# Patient Record
Sex: Male | Born: 1955 | Race: White | Hispanic: No | Marital: Married | Smoking: Former smoker
Health system: Southern US, Community
[De-identification: ages and names within clinical notes are randomized; demographics above are authoritative.]

## PROBLEM LIST (undated history)

## (undated) DIAGNOSIS — I2510 Atherosclerotic heart disease of native coronary artery without angina pectoris: Secondary | ICD-10-CM

## (undated) DIAGNOSIS — Z8619 Personal history of other infectious and parasitic diseases: Secondary | ICD-10-CM

## (undated) DIAGNOSIS — E785 Hyperlipidemia, unspecified: Secondary | ICD-10-CM

## (undated) DIAGNOSIS — Z8639 Personal history of other endocrine, nutritional and metabolic disease: Secondary | ICD-10-CM

## (undated) DIAGNOSIS — G510 Bell's palsy: Secondary | ICD-10-CM

## (undated) DIAGNOSIS — H532 Diplopia: Secondary | ICD-10-CM

## (undated) DIAGNOSIS — H269 Unspecified cataract: Secondary | ICD-10-CM

## (undated) DIAGNOSIS — E079 Disorder of thyroid, unspecified: Secondary | ICD-10-CM

## (undated) HISTORY — DX: Hyperlipidemia, unspecified: E78.5

## (undated) HISTORY — DX: Bell's palsy: G51.0

## (undated) HISTORY — PX: CARDIAC CATHETERIZATION: SHX172

## (undated) HISTORY — DX: Personal history of other endocrine, nutritional and metabolic disease: Z86.39

## (undated) HISTORY — PX: TONSILLECTOMY: SUR1361

## (undated) HISTORY — DX: Personal history of other infectious and parasitic diseases: Z86.19

## (undated) HISTORY — DX: Unspecified cataract: H26.9

## (undated) HISTORY — PX: COLONOSCOPY: SHX174

## (undated) HISTORY — DX: Atherosclerotic heart disease of native coronary artery without angina pectoris: I25.10

## (undated) HISTORY — PX: TONSILLECTOMY AND ADENOIDECTOMY: SHX28

---

## 2002-08-26 DIAGNOSIS — I2510 Atherosclerotic heart disease of native coronary artery without angina pectoris: Secondary | ICD-10-CM

## 2002-08-26 HISTORY — DX: Atherosclerotic heart disease of native coronary artery without angina pectoris: I25.10

## 2002-11-08 HISTORY — PX: PERCUTANEOUS CORONARY STENT INTERVENTION (PCI-S): SHX6016

## 2015-03-09 ENCOUNTER — Encounter: Payer: Self-pay | Admitting: Family Medicine

## 2015-03-09 ENCOUNTER — Ambulatory Visit (INDEPENDENT_AMBULATORY_CARE_PROVIDER_SITE_OTHER): Payer: BLUE CROSS/BLUE SHIELD | Admitting: Family Medicine

## 2015-03-09 VITALS — BP 148/78 | HR 78 | Temp 97.8°F | Ht 64.5 in | Wt 175.4 lb

## 2015-03-09 DIAGNOSIS — R030 Elevated blood-pressure reading, without diagnosis of hypertension: Secondary | ICD-10-CM | POA: Insufficient documentation

## 2015-03-09 DIAGNOSIS — I2510 Atherosclerotic heart disease of native coronary artery without angina pectoris: Secondary | ICD-10-CM | POA: Diagnosis not present

## 2015-03-09 DIAGNOSIS — I25119 Atherosclerotic heart disease of native coronary artery with unspecified angina pectoris: Secondary | ICD-10-CM | POA: Insufficient documentation

## 2015-03-09 DIAGNOSIS — E66811 Obesity, class 1: Secondary | ICD-10-CM | POA: Insufficient documentation

## 2015-03-09 DIAGNOSIS — E785 Hyperlipidemia, unspecified: Secondary | ICD-10-CM | POA: Diagnosis not present

## 2015-03-09 DIAGNOSIS — E663 Overweight: Secondary | ICD-10-CM

## 2015-03-09 DIAGNOSIS — E669 Obesity, unspecified: Secondary | ICD-10-CM | POA: Insufficient documentation

## 2015-03-09 NOTE — Patient Instructions (Addendum)
Start aspirin  daily. Let's keep an eye on blood pressure at home and if consistently >140/90, let me know. Work on [employer] and increased water intake. I recommend a mediterranean diet. Return as needed or in 2-3 months for physical.      Mediterranean Diet  Why follow it? Research shows. . Those who follow the Mediterranean diet have a reduced risk of heart disease  . The diet is associated with a reduced incidence of Parkinson's and Alzheimer's diseases . People following the diet may have longer life expectancies and lower rates of chronic diseases  . The Dietary Guidelines for Americans recommends the Mediterranean diet as an eating plan to promote health and prevent disease  What Is the Mediterranean Diet?  . Healthy eating plan based on typical foods and recipes of Mediterranean-style cooking . The diet is primarily a plant based diet; these foods should make up a majority of meals   Starches - Plant based foods should make up a majority of meals - They are an important sources of vitamins, minerals, energy, antioxidants, and fiber - Choose whole grains, foods high in fiber and minimally processed items  - Typical grain sources include wheat, oats, barley, corn, brown rice, bulgar, farro, millet, polenta, couscous  - Various types of beans include chickpeas, lentils, fava beans, black beans, white beans   Fruits  Veggies - Large quantities of antioxidant rich fruits & veggies; 6 or more servings  - Vegetables can be eaten raw or lightly drizzled with oil and cooked  - Vegetables common to the traditional Mediterranean Diet include: artichokes, arugula, beets, broccoli, brussel sprouts, cabbage, carrots, celery, collard greens, cucumbers, eggplant, kale, leeks, lemons, lettuce, mushrooms, okra, onions, peas, peppers, potatoes, pumpkin, radishes, rutabaga, shallots, spinach, sweet potatoes, turnips, zucchini - Fruits common to the Mediterranean Diet include: apples, apricots,  avocados, cherries, clementines, dates, figs, grapefruits, grapes, melons, nectarines, oranges, peaches, pears, pomegranates, strawberries, tangerines  Fats - Replace butter and margarine with healthy oils, such as olive oil, canola oil, and tahini  - Limit nuts to no more than a handful a day  - Nuts include walnuts, almonds, pecans, pistachios, pine nuts  - Limit or avoid candied, honey roasted or heavily salted nuts - Olives are central to the [employer] - can be eaten whole or used in a variety of dishes   Meats Protein - Limiting red meat: no more than a few times a month - When eating red meat: choose lean cuts and keep the portion to the size of deck of cards - Eggs: approx. 0 to 4 times a week  - Fish and lean poultry: at least 2 a week  - Healthy protein sources include, chicken, [place], lean beef, lamb - Increase intake of seafood such as tuna, salmon, trout, mackerel, shrimp, scallops - Avoid or limit high fat processed meats such as sausage and bacon  Dairy - Include moderate amounts of low fat dairy products  - Focus on healthy dairy such as fat free yogurt, skim milk, low or reduced fat cheese - Limit dairy products higher in fat such as whole or 2% milk, cheese, ice cream  Alcohol - Moderate amounts of red wine is ok  - No more than 5 oz daily for women (all ages) and men older than age 92  - No more than 10 oz of wine daily for men younger than 64  Other - Limit sweets and other desserts  - Use herbs and spices instead of salt to  flavor foods  - Herbs and spices common to the traditional Mediterranean Diet include: basil, bay leaves, chives, cloves, cumin, fennel, garlic, lavender, marjoram, mint, oregano, parsley, pepper, rosemary, sage, savory, sumac, tarragon, thyme   It's not just a diet, it's a lifestyle:  . The Mediterranean diet includes lifestyle factors typical of those in the region  . Foods, drinks and meals are best eaten with others and savored . Daily  physical activity is important for overall good health . This could be strenuous exercise like running and aerobics . This could also be more leisurely activities such as walking, housework, yard-work, or taking the stairs . Moderation is the key; a balanced and healthy diet accommodates most foods and drinks . Consider portion sizes and frequency of consumption of certain foods   Meal Ideas & Options:  . Breakfast:  o Whole wheat toast or whole wheat English muffins with peanut butter & hard boiled egg o Steel cut oats topped with apples & cinnamon and skim milk  o Fresh fruit: banana, strawberries, melon, berries, peaches  o Smoothies: strawberries, bananas, greek yogurt, peanut butter o Low fat greek yogurt with blueberries and granola  o Egg white omelet with spinach and mushrooms o Breakfast couscous: whole wheat couscous, apricots, skim milk, cranberries  . Sandwiches:  o Hummus and grilled vegetables (peppers, zucchini, squash) on whole wheat bread   o Grilled chicken on whole wheat pita with lettuce, tomatoes, cucumbers or tzatziki  o Tuna salad on whole wheat bread: tuna salad made with greek yogurt, olives, red peppers, capers, green onions o Garlic rosemary lamb pita: lamb sauted with garlic, rosemary, salt & pepper; add lettuce, cucumber, greek yogurt to pita - flavor with lemon juice and black pepper  . Seafood:  o Mediterranean grilled salmon, seasoned with garlic, basil, parsley, lemon juice and black pepper o Shrimp, lemon, and spinach whole-grain pasta salad made with low fat greek yogurt  o Seared scallops with lemon orzo  o Seared tuna steaks seasoned salt, pepper, coriander topped with tomato mixture of olives, tomatoes, olive oil, minced garlic, parsley, green onions and cappers  . Meats:  o Herbed greek chicken salad with kalamata olives, cucumber, feta  o Red bell peppers stuffed with spinach, bulgur, lean ground beef (or lentils) & topped with feta   o Kebabs:  skewers of chicken, tomatoes, onions, zucchini, squash  o [place]urkey burgers: made with red onions, mint, dill, lemon juice, feta cheese topped with roasted red peppers . Vegetarian o Cucumber salad: cucumbers, artichoke hearts, celery, red onion, feta cheese, tossed in olive oil & lemon juice  o Hummus and whole grain pita points with a greek salad (lettuce, tomato, feta, olives, cucumbers, red onion) o Lentil soup with celery, carrots made with vegetable broth, garlic, salt and pepper  o Tabouli salad: parsley, bulgur, mint, scallions, cucumbers, tomato, radishes, lemon juice, olive oil, salt and pepper.

## 2015-03-09 NOTE — Assessment & Plan Note (Signed)
Check FLP next fasting labs. 

## 2015-03-09 NOTE — Assessment & Plan Note (Signed)
S/p stent placed 2004. Requested records today. Advised he start aspirin 81mg  daily. Await records.

## 2015-03-09 NOTE — Progress Notes (Signed)
Pre visit review using our clinic review tool, if applicable. No additional management support is needed unless otherwise documented below in the visit note. 

## 2015-03-09 NOTE — Assessment & Plan Note (Signed)
Discussed increased activity for goal weight loss.

## 2015-03-09 NOTE — Assessment & Plan Note (Signed)
States runs mildly elevated at home but pt declines medication for now. He will monitor blood pressures and let me know if consistently >140/90

## 2015-03-09 NOTE — Progress Notes (Signed)
BP 148/78 mmHg  Pulse 78  Temp(Src) 97.8 F (36.6 C) (Oral)  Ht 5' 4.5" (1.638 m)  Wt 175 lb 6.4 oz (79.561 kg)  BMI 29.65 kg/m2   CC: new pt to establish  Subjective:    Patient ID: Roger [name], male    DOB: 1956-05-10, 59 y.o.   MRN: [id number]  HPI: Roger [name] is a 59 y.o. male presenting on 03/09/2015 for No chief complaint on file.   Recently moved from [place] prior saw Dr Vassie Loll Ward Givens in [place]. Cardiologist was Dr Darrick Penna in [facility], [place]. Works for [employer].  CAD - s/p stent placement 2004 [place]. Actually states cardiologist took him off cardiac meds because he was doing so well. States normal treadmill stress test last checked.   Elevated blood pressure at home.  Occasional "gallbladder attacks". Mainly notices when eating pork. States this has been evaluated and normal in past.  Preventative: No recent CPE Colonoscopy - normal unsure date ([place])  Lives with wife, daughter with granddaughter Edu: some college Occupation: Therapist, art Activity: stays active at work, 20,000 steps daily Diet: poor water, good fruits/vegetables daily  Relevant past medical, surgical, family and social history reviewed and updated as indicated. Interim medical history since our last visit reviewed. Allergies and medications reviewed and updated. No current outpatient prescriptions on file prior to visit.   No current facility-administered medications on file prior to visit.    Review of Systems Per HPI unless specifically indicated above     Objective:    BP 148/78 mmHg  Pulse 78  Temp(Src) 97.8 F (36.6 C) (Oral)  Ht 5' 4.5" (1.638 m)  Wt 175 lb 6.4 oz (79.561 kg)  BMI 29.65 kg/m2  Wt Readings from Last 3 Encounters:  03/09/15 175 lb 6.4 oz (79.561 kg)    Physical Exam  Constitutional: He appears well-developed and well-nourished. No distress.  HENT:  Mouth/Throat: Oropharynx is clear and moist. No  oropharyngeal exudate.  Eyes: Conjunctivae and EOM are normal. Pupils are equal, round, and reactive to light. No scleral icterus.  Neck: Normal range of motion. Neck supple. No thyromegaly present.  Cardiovascular: Normal rate and intact distal pulses.  An irregular rhythm present. Exam reveals gallop (rare).   No murmur heard. Pulmonary/Chest: Effort normal and breath sounds normal. No respiratory distress. He has no wheezes. He has no rales.  Musculoskeletal: He exhibits no edema.  Lymphadenopathy:    He has no cervical adenopathy.  Psychiatric: He has a normal mood and affect.  Nursing note and vitals reviewed.  No results found for this or any previous visit.    Assessment & Plan:  Records requested today.  Problem List Items Addressed This Visit    CAD (coronary artery disease) - Primary    S/p stent placed 2004. Requested records today. Advised he start aspirin  daily. Await records.      Relevant Medications   aspirin (ASPIRIN EC) 81 MG EC tablet   Elevated blood pressure reading without diagnosis of hypertension    States runs mildly elevated at home but pt declines medication for now. He will monitor blood pressures and let me know if consistently >140/90      HLD (hyperlipidemia)    Check FLP next fasting labs.      Relevant Medications   aspirin (ASPIRIN EC) 81 MG EC tablet   Overweight    Discussed increased activity for goal weight loss.  Follow up plan: Return in about 3 months (around 06/09/2015), or as needed, for follow up visit.

## 2015-04-09 ENCOUNTER — Encounter (HOSPITAL_COMMUNITY): Payer: Self-pay | Admitting: *Deleted

## 2015-04-09 ENCOUNTER — Emergency Department (HOSPITAL_COMMUNITY)
Admission: EM | Admit: 2015-04-09 | Discharge: 2015-04-09 | Disposition: A | Discharge status: Home / Self Care | Payer: Worker's Compensation | Attending: Emergency Medicine | Admitting: Emergency Medicine

## 2015-04-09 ENCOUNTER — Emergency Department (HOSPITAL_COMMUNITY): Payer: Worker's Compensation

## 2015-04-09 DIAGNOSIS — Y9389 Activity, other specified: Secondary | ICD-10-CM | POA: Diagnosis not present

## 2015-04-09 DIAGNOSIS — M79644 Pain in right finger(s): Secondary | ICD-10-CM

## 2015-04-09 DIAGNOSIS — Y998 Other external cause status: Secondary | ICD-10-CM | POA: Insufficient documentation

## 2015-04-09 DIAGNOSIS — Y9289 Other specified places as the place of occurrence of the external cause: Secondary | ICD-10-CM | POA: Diagnosis not present

## 2015-04-09 DIAGNOSIS — S6991XA Unspecified injury of right wrist, hand and finger(s), initial encounter: Secondary | ICD-10-CM | POA: Diagnosis not present

## 2015-04-09 DIAGNOSIS — X58XXXA Exposure to other specified factors, initial encounter: Secondary | ICD-10-CM | POA: Diagnosis not present

## 2015-04-09 DIAGNOSIS — Z8639 Personal history of other endocrine, nutritional and metabolic disease: Secondary | ICD-10-CM | POA: Diagnosis not present

## 2015-04-09 DIAGNOSIS — Z87891 Personal history of nicotine dependence: Secondary | ICD-10-CM | POA: Diagnosis not present

## 2015-04-09 DIAGNOSIS — Z79899 Other long term (current) drug therapy: Secondary | ICD-10-CM | POA: Diagnosis not present

## 2015-04-09 DIAGNOSIS — Z7982 Long term (current) use of aspirin: Secondary | ICD-10-CM | POA: Insufficient documentation

## 2015-04-09 DIAGNOSIS — I2510 Atherosclerotic heart disease of native coronary artery without angina pectoris: Secondary | ICD-10-CM | POA: Diagnosis not present

## 2015-04-09 DIAGNOSIS — Z8619 Personal history of other infectious and parasitic diseases: Secondary | ICD-10-CM | POA: Diagnosis not present

## 2015-04-09 MED ORDER — IBUPROFEN 200 MG PO TABS
600.0000 mg | ORAL_TABLET | Freq: Once | ORAL | Status: AC
  Administered 2015-04-09: 600 mg via ORAL
  Filled 2015-04-09: qty 3

## 2015-04-09 NOTE — ED Notes (Signed)
Workers comp, pt had crush injury to right thumb this morning. No acute distress noted.

## 2015-04-09 NOTE — ED Notes (Signed)
Pt transported to xray, called phlebotomy to do workers comp drug screen

## 2015-04-09 NOTE — Discharge Instructions (Signed)
Please use tylenol or ibuprofen as needed for pain. Please follow-up with your primary care provider for further evaluation and management.

## 2015-04-09 NOTE — ED Provider Notes (Signed)
CSN: [id number]     Arrival date & time 04/09/15  5284 History   First MD Initiated Contact with Patient 04/09/15 [phone]     Chief Complaint  Patient presents with  . Finger Injury    HPI   59 year old male presents today with right thumb pain. Patient reports he was working when a heavy box landed on his right finger. Patient reports immediate pain to the lateral dorsal aspect of the right thumb. Patient reports full active range of motion of the thumb and fingers, no open wounds to the affected extremity. Patient notes there is a "bump" on the thumb, this is what concerned him the most. Review of prior injuries to the thumb. Patient has not taken any medication for the pain.    Past Medical History  Diagnosis Date  . History of chicken pox   . CAD (coronary artery disease) 2004    with stent, unclear details  . HLD (hyperlipidemia)   . History of hypothyroidism as a child  . Bell's palsy x2    recurrent   Past Surgical History  Procedure Laterality Date  . Tonsillectomy and adenoidectomy Bilateral   . Percutaneous coronary stent intervention (pci-s)  11/08/02    [place]  . Colonoscopy      normal per patient   Family History  Problem Relation Age of Onset  . Cancer Mother 47    colon  . Cancer Father 49    prostate  . CAD Father 64    massive MI  . CAD Paternal Uncle   . Alcoholism Paternal Uncle   . Stroke Paternal Uncle   . Diabetes Other     maternal side   Social History  Substance Use Topics  . Smoking status: Former Smoker    Quit date: 08/26/1982  . Smokeless tobacco: Never Used  . Alcohol Use: No    Review of Systems  All other systems reviewed and are negative.   Allergies  Review of patient's allergies indicates no known allergies.  Home Medications   Prior to Admission medications   Medication Sig Start Date End Date Taking? Authorizing Provider  aspirin (ASPIRIN EC) 81 MG EC tablet Take 81 mg by mouth daily. Swallow whole.    Historical Provider,  MD  Multiple Vitamins-Minerals (MULTIVITAMIN ADULT PO) Take 1 tablet by mouth daily.    Historical Provider, MD   BP 134/86 mmHg  Pulse 62  Temp(Src) 97.5 F (36.4 C) (Oral)  Resp 16  SpO2 98%   Physical Exam  Constitutional: He is oriented to person, place, and time. He appears well-developed and well-nourished.  HENT:  Head: Normocephalic and atraumatic.  Eyes: Conjunctivae are normal. Pupils are equal, round, and reactive to light. Right eye exhibits no discharge. Left eye exhibits no discharge. No scleral icterus.  Neck: Normal range of motion. No JVD present. No tracheal deviation present.  Cardiovascular: Normal rate.   Pulmonary/Chest: Effort normal. No stridor.  Musculoskeletal:  Tenderness to the right dorsal lateral aspect of the thumb at the DIP. Firm nodule noted. Patient has minor pain with flexion of the DIP, 5 out of 5 strength of the hand, and isolated joints. Sensation grossly intact, cap refill less than 3 seconds. No obvious signs of trauma  Neurological: He is alert and oriented to person, place, and time. Coordination normal.  Psychiatric: He has a normal mood and affect. His behavior is normal. Judgment and thought content normal.  Nursing note and vitals reviewed.   ED Course  Procedures (  including critical care time) Labs Review Labs Reviewed - No data to display  Imaging Review Dg Finger Thumb Right  04/09/2015   CLINICAL DATA:  59 year old male with crush injury to the right thumb. Pain on the posterior aspect of the right thumb.  EXAM: RIGHT THUMB 2+V  COMPARISON:  No priors.  FINDINGS: Three views of the right thumb demonstrate no acute displaced fracture, subluxation or disc dictation. There is joint space narrowing, subchondral sclerosis, subchondral cyst formation and osteophyte formation, compatible with osteoarthritis at the interphalangeal joint.  IMPRESSION: 1. No acute radiographic abnormality of the right thumb. 2. 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   On: 04/09/2015 08:58   I, Ronie Barnhart Todd, personally reviewed and evaluated these images and lab results as part of my medical decision-making.   EKG Interpretation None      MDM   Final diagnoses:  Thumb pain, right    Labs:  Imaging: DG finger   Consults:  Therapeutics:  Discharge Meds:   Assessment/Plan: Patient presents with thumb pain today. No findings on exam or diagnostic imaging that would necessitate further evaluation or management here in the ED setting. Patient is given instructions for symptomatic care, follow up with primary care provider for reevaluation.        Eyvonne Mechanic, PA-C 04/09/15 1610  Margarita Grizzle, MD 04/11/15 [phone]

## 2015-08-05 ENCOUNTER — Emergency Department: Payer: BLUE CROSS/BLUE SHIELD

## 2015-08-05 ENCOUNTER — Emergency Department
Admission: EM | Admit: 2015-08-05 | Discharge: 2015-08-05 | Disposition: A | Discharge status: Home / Self Care | Payer: BLUE CROSS/BLUE SHIELD | Attending: Emergency Medicine | Admitting: Emergency Medicine

## 2015-08-05 ENCOUNTER — Encounter: Payer: Self-pay | Admitting: Emergency Medicine

## 2015-08-05 DIAGNOSIS — J209 Acute bronchitis, unspecified: Secondary | ICD-10-CM | POA: Diagnosis not present

## 2015-08-05 DIAGNOSIS — Z79899 Other long term (current) drug therapy: Secondary | ICD-10-CM | POA: Insufficient documentation

## 2015-08-05 DIAGNOSIS — Z7982 Long term (current) use of aspirin: Secondary | ICD-10-CM | POA: Insufficient documentation

## 2015-08-05 DIAGNOSIS — Z87891 Personal history of nicotine dependence: Secondary | ICD-10-CM | POA: Diagnosis not present

## 2015-08-05 DIAGNOSIS — R05 Cough: Secondary | ICD-10-CM | POA: Diagnosis present

## 2015-08-05 MED ORDER — AZITHROMYCIN 250 MG PO TABS: ORAL_TABLET | ORAL | Status: DC

## 2015-08-05 MED ORDER — GUAIFENESIN-CODEINE 100-10 MG/5ML PO SOLN: 10.0000 mL | ORAL | Status: DC | PRN

## 2015-08-05 NOTE — Discharge Instructions (Signed)

## 2015-08-05 NOTE — ED Notes (Signed)
Pt presents with cough x 2 weeks; taking robitussin. Sometimes he coughs up phlegm, other times, not. Pt alert & oriented with NAD noted.

## 2015-08-05 NOTE — ED Notes (Signed)
Patient presents to the ED with cough x 2 weeks.  Patient denies recent fever.  Denies nausea, vomiting, and diarrhea.  Patient reports pain when he coughs.  Patient walking and talking without difficulty.

## 2015-08-05 NOTE — ED Provider Notes (Signed)
[facility] Emergency Department Provider Note  ____________________________________________  Time seen: Approximately 7:04 PM  I have reviewed the triage vital signs and the nursing notes.   HISTORY  Chief Complaint Cough    HPI Roger MalkinJohn [name] is a 59 y.o. male patient presents for evaluation for cough 2 weeks. Patient states no fever chills nausea vomiting. States that he has pain/burning in his chest when he coughs.States quit smoking 30 years ago but can taste and nicotine when he coughs now.   Past Medical History  Diagnosis Date  . History of chicken pox   . CAD (coronary artery disease) 2004    with stent, unclear details  . HLD (hyperlipidemia)   . History of hypothyroidism as a child  . Bell's palsy x2    recurrent    Patient Active Problem List   Diagnosis Date Noted  . Overweight 03/09/2015  . Elevated blood pressure reading without diagnosis of hypertension 03/09/2015  . CAD (coronary artery disease)   . HLD (hyperlipidemia)     Past Surgical History  Procedure Laterality Date  . Tonsillectomy and adenoidectomy Bilateral   . Percutaneous coronary stent intervention (pci-s)  11/08/02    [place]  . Colonoscopy      normal per patient  . Tonsillectomy    . Cardiac surgery      Current Outpatient Rx  Name  Route  Sig  Dispense  Refill  . aspirin (ASPIRIN EC) 81 MG EC tablet   Oral   Take 81 mg by mouth daily. Swallow whole.         Marland Kitchen. azithromycin (ZITHROMAX Z-PAK) 250 MG tablet      Take 2 tablets (500 mg) on  Day 1,  followed by 1 tablet (250 mg) once daily on Days 2 through 5.   6 each   0   . guaiFENesin-codeine 100-10 MG/5ML syrup   Oral   Take 10 mLs by mouth every 4 (four) hours as needed for cough.   180 mL   0   . Multiple Vitamins-Minerals (MULTIVITAMIN ADULT PO)   Oral   Take 1 tablet by mouth daily.           Allergies Review of patient's allergies indicates no known allergies.  Family History   Problem Relation Age of Onset  . Cancer Mother 1954    colon  . Cancer Father 3555    prostate  . CAD Father 7974    massive MI  . CAD Paternal Uncle   . Alcoholism Paternal Uncle   . Stroke Paternal Uncle   . Diabetes Other     maternal side    Social History Social History  Substance Use Topics  . Smoking status: Former Smoker    Quit date: 08/26/1982  . Smokeless tobacco: Never Used  . Alcohol Use: No    Review of Systems Constitutional: No fever/chills Eyes: No visual changes. ENT: No sore throat. Cardiovascular: Denies chest pain. Respiratory: Denies shortness of breath. Positive for cough Gastrointestinal: No abdominal pain.  No nausea, no vomiting.  No diarrhea.  No constipation. Genitourinary: Negative for dysuria. Musculoskeletal: Negative for back pain. Skin: Negative for rash. Neurological: Negative for headaches, focal weakness or numbness.  10-point ROS otherwise negative.  ____________________________________________   PHYSICAL EXAM:  VITAL SIGNS: ED Triage Vitals  Enc Vitals Group     BP 08/05/15 1819 146/91 mmHg     Pulse Rate 08/05/15 1819 85     Resp 08/05/15 1819 20  Temp 08/05/15 1819 98.1 F (36.7 C)     Temp Source 08/05/15 1819 Oral     SpO2 08/05/15 1819 96 %     Weight 08/05/15 1819 172 lb (78.019 kg)     Height 08/05/15 1819  (1.651 m)     Head Cir --      Peak Flow --      Pain Score 08/05/15 1818 3     Pain Loc --      Pain Edu? --      Excl. in GC? --     Constitutional: Alert and oriented. Well appearing and in no acute distress. Eyes: Conjunctivae are normal. PERRL. EOMI. Head: Atraumatic. Nose: No congestion/rhinnorhea. Mouth/Throat: Mucous membranes are moist.  Oropharynx non-erythematous. Neck: No stridor.   Cardiovascular: Normal rate, regular rhythm. Grossly normal heart sounds.  Good peripheral circulation. Respiratory: Normal respiratory effort.  No retractions. Lungs CTAB. Musculoskeletal: No lower  extremity tenderness nor edema.  No joint effusions. Neurologic:  Normal speech and language. No gross focal neurologic deficits are appreciated. No gait instability. Skin:  Skin is warm, dry and intact. No rash noted. Psychiatric: Mood and affect are normal. Speech and behavior are normal.  ____________________________________________   LABS (all labs ordered are listed, but only abnormal results are displayed)  Labs Reviewed - No data to display ____________________________________________  RADIOLOGY  IMPRESSION: Rounded density projecting in the right infrahilar region not well appreciated on the lateral projection and likely vascular in nature. Nonemergent CT can be performed for further evaluation. ____________________________________________   PROCEDURES  Procedure(s) performed: None  Critical Care performed: No  ____________________________________________   INITIAL IMPRESSION / ASSESSMENT AND PLAN / ED COURSE  Pertinent labs & imaging results that were available during my care of the patient were reviewed by me and considered in my medical decision making (see chart for details).  Acute bronchitis. We'll treat with Zithromax and Robitussin-AC. Patient informed of x-ray studies in instructed to follow-up with his PCP for outpatient CT scan. Patient voices no other emergency medical questions at this time. ____________________________________________   FINAL CLINICAL IMPRESSION(S) / ED DIAGNOSES  Final diagnoses:  Acute bronchitis, unspecified organism      Evangeline Dakin, PA-C 08/05/15 1948  Emily Filbert, MD 08/05/15 [phone]

## 2015-08-05 NOTE — ED Notes (Signed)
Reviewed d/c instructions, follow-up care and prescriptions with patient. Pt verbalized understanding.  

## 2016-01-23 ENCOUNTER — Encounter: Payer: Self-pay | Admitting: Family Medicine

## 2016-01-23 ENCOUNTER — Ambulatory Visit (INDEPENDENT_AMBULATORY_CARE_PROVIDER_SITE_OTHER): Payer: BLUE CROSS/BLUE SHIELD | Admitting: Family Medicine

## 2016-01-23 VITALS — BP 128/80 | HR 76 | Temp 97.5°F | Ht 64.5 in | Wt 172.5 lb

## 2016-01-23 DIAGNOSIS — M545 Low back pain, unspecified: Secondary | ICD-10-CM | POA: Insufficient documentation

## 2016-01-23 DIAGNOSIS — M25512 Pain in left shoulder: Secondary | ICD-10-CM

## 2016-01-23 DIAGNOSIS — M25511 Pain in right shoulder: Secondary | ICD-10-CM | POA: Insufficient documentation

## 2016-01-23 MED ORDER — CYCLOBENZAPRINE HCL 10 MG PO TABS: 5.0000 mg | ORAL_TABLET | Freq: Two times a day (BID) | ORAL | Status: DC | PRN

## 2016-01-23 NOTE — Patient Instructions (Signed)
I think you have strain of lower left back muscles and possible bursitis of shoulder. Treat with ibuprofen 400mg  twice daily with meals for 1 week and flexeril muscle relaxant for lower back (caution it may make you sleepy). May use heating pad to lower back as well. We will refer you to physical therapy for back. Let us know if not better with this treatment.

## 2016-01-23 NOTE — Progress Notes (Signed)
Pre visit review using our clinic review tool, if applicable. No additional management support is needed unless otherwise documented below in the visit note. 

## 2016-01-23 NOTE — Progress Notes (Signed)
BP 128/80 mmHg  Pulse 76  Temp(Src) 97.5 F (36.4 C) (Oral)  Ht 5' 4.5" (1.638 m)  Wt 172 lb 8 oz (78.245 kg)  BMI 29.16 kg/m2   CC: back and shoulder pain  Subjective:    Patient ID: Roger [name], male    DOB: 1956/07/28, 60 y.o.   MRN: [id number]  HPI: Roger [name] is a 60 y.o. male presenting on 01/23/2016 for Back Pain and Shoulder Pain   Several month h/o lower back pain with some lower left back tenderness. Radiation up entire back. Off last 4 days - marked improvement with rest. Describes stabbing pain.   R shoulder pain ongoing for several months as well.   Ibuprofen helps. Activity worsens pain, rest improves pain.   Denies inciting trauma/injury or falls.  Denies fevers/chills, shooting pain down arms or legs, numbness/weakness of legs, or bladder/bowel incontinence.   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   Relevant past medical, surgical, family and social history reviewed and updated as indicated. Interim medical history since our last visit reviewed. Allergies and medications reviewed and updated. Current Outpatient Prescriptions on File Prior to Visit  Medication Sig  . aspirin (ASPIRIN EC) 81 MG EC tablet Take 81 mg by mouth daily. Swallow whole.  . Multiple Vitamins-Minerals (MULTIVITAMIN ADULT PO) Take 1 tablet by mouth daily.   No current facility-administered medications on file prior to visit.    Review of Systems Per HPI unless specifically indicated in ROS section     Objective:    BP 128/80 mmHg  Pulse 76  Temp(Src) 97.5 F (36.4 C) (Oral)  Ht 5' 4.5" (1.638 m)  Wt 172 lb 8 oz (78.245 kg)  BMI 29.16 kg/m2  Wt Readings from Last 3 Encounters:  01/23/16 172 lb 8 oz (78.245 kg)  08/05/15 172 lb (78.019 kg)  03/09/15 175 lb 6.4 oz (79.561 kg)    Physical Exam  Constitutional: He appears well-developed and well-nourished. No distress.  Musculoskeletal: He exhibits no edema.  No pain midline spine No paraspinous mm  tenderness Neg SLR bilaterally. No pain with int/ext rotation at hip. + FABER on right. No pain at SIJ, GTB or sciatic notch bilaterally.  Tender to palpation L lower back.  L shoulder WNL  R Shoulder exam: No deformity of shoulders on inspection. + pain with palpation of R AC joint and bursa. FROM in abduction and forward flexion. No pain or weakness with testing SITS in ext/int rotation. No pain with empty can sign. Neg Speed test. + impingement. No pain with crossover test. + mild pain with rotation of humeral head in GH joint.   Skin: Skin is warm and dry. No rash noted.  Psychiatric: He has a normal mood and affect.  Nursing note and vitals reviewed.  No results found for this or any previous visit.    Assessment & Plan:   Problem List Items Addressed This Visit    Right shoulder pain    Anticipate R shoulder bursitis. rec ice/heat, ibuprofen, flexeril, PT. Update if not improved with treatment      Relevant Orders   Ambulatory referral to Physical Therapy   Left-sided low back pain without sciatica - Primary    Anticipate L lumbar paraspinous or lat dorsi strain. Supportive care discussed including heating pad, ibuprofen  bid with meals x 1 wk, flexeril, rest. rec avoid heavy lifting at work >20lbs, pt declines light duty restriction work note.  Will also refer to PT for  further management.  Update if not improving with treatment, consider imaging.       Relevant Medications   cyclobenzaprine (FLEXERIL) 10 MG tablet   Other Relevant Orders   Ambulatory referral to Physical Therapy       Follow up plan: Return if symptoms worsen or fail to improve.  Eustaquio BoydenJavier Bridgette Wolden, MD

## 2016-01-23 NOTE — Assessment & Plan Note (Signed)
Anticipate R shoulder bursitis. rec ice/heat, ibuprofen, flexeril, PT. Update if not improved with treatment

## 2016-01-23 NOTE — Assessment & Plan Note (Signed)
Anticipate L lumbar paraspinous or lat dorsi strain. Supportive care discussed including heating pad, ibuprofen 400mg  bid with meals x 1 wk, flexeril, rest. rec avoid heavy lifting at work >20lbs, pt declines light duty restriction work note.  Will also refer to PT for further management.  Update if not improving with treatment, consider imaging.

## 2016-06-11 ENCOUNTER — Encounter: Payer: Self-pay | Admitting: Family Medicine

## 2016-06-11 ENCOUNTER — Ambulatory Visit (INDEPENDENT_AMBULATORY_CARE_PROVIDER_SITE_OTHER): Payer: BLUE CROSS/BLUE SHIELD | Admitting: Family Medicine

## 2016-06-11 ENCOUNTER — Ambulatory Visit (INDEPENDENT_AMBULATORY_CARE_PROVIDER_SITE_OTHER)
Admission: RE | Admit: 2016-06-11 | Discharge: 2016-06-11 | Disposition: A | Discharge status: Home / Self Care | Payer: BLUE CROSS/BLUE SHIELD | Source: Ambulatory Visit | Attending: Family Medicine | Admitting: Family Medicine

## 2016-06-11 VITALS — BP 134/90 | HR 76 | Temp 97.6°F | Wt 167.5 lb

## 2016-06-11 DIAGNOSIS — R079 Chest pain, unspecified: Secondary | ICD-10-CM | POA: Insufficient documentation

## 2016-06-11 DIAGNOSIS — R0789 Other chest pain: Secondary | ICD-10-CM | POA: Diagnosis not present

## 2016-06-11 NOTE — Assessment & Plan Note (Signed)
Ribcage strain vs rib fractures. Right rib xrays to r/o flail chest although doubt given stability with breathing. Supportive care discussed as per instructions. Anticipate 4-6 wk recovery course.

## 2016-06-11 NOTE — Progress Notes (Signed)
Pre visit review using our clinic review tool, if applicable. No additional management support is needed unless otherwise documented below in the visit note. 

## 2016-06-11 NOTE — Progress Notes (Signed)
   BP 134/90   Pulse 76   Temp 97.6 F (36.4 C) (Oral)   Wt 167 lb 8 oz (76 kg)   BMI 28.31 kg/m    CC: right chest pain Subjective:    Patient ID: Roger MalkinJohn Moffit, male    DOB: 1956-07-18, 60 y.o.   MRN: [id number]  HPI: Roger MalkinJohn Rubi is a 60 y.o. male presenting on 06/11/2016 for Chest Pain (x 1 week after granddaughter jumped on him)   1 wk ago 62yo granddaughter sat on his right chest while he was laying on the ground. Since then certain positions cause R chest wall tenderness. Treating with OTC tylenol and ibuprofen. Denies wheezing, dyspnea or cough.   Relevant past medical, surgical, family and social history reviewed and updated as indicated. Interim medical history since our last visit reviewed. Allergies and medications reviewed and updated. Current Outpatient Prescriptions on File Prior to Visit  Medication Sig  . aspirin (ASPIRIN EC) 81 MG EC tablet Take 81 mg by mouth daily. Swallow whole.  . Multiple Vitamins-Minerals (MULTIVITAMIN ADULT PO) Take 1 tablet by mouth daily.   No current facility-administered medications on file prior to visit.     Review of Systems Per HPI unless specifically indicated in ROS section     Objective:    BP 134/90   Pulse 76   Temp 97.6 F (36.4 C) (Oral)   Wt 167 lb 8 oz (76 kg)   BMI 28.31 kg/m   Wt Readings from Last 3 Encounters:  06/11/16 167 lb 8 oz (76 kg)  01/23/16 172 lb 8 oz (78.2 kg)  08/05/15 172 lb (78 kg)    Physical Exam  Constitutional: He appears well-developed and well-nourished. No distress.  Cardiovascular: Normal rate, regular rhythm, normal heart sounds and intact distal pulses.   No murmur heard. Pulmonary/Chest: Effort normal and breath sounds normal. No respiratory distress. He has no wheezes. He has no rales. He exhibits tenderness.    R ribcage point discomfort to palpation at several areas  No sternal or costochondral discomfort  Abdominal: Soft. Bowel sounds are normal. He exhibits no  distension. There is no tenderness.  Psychiatric: He has a normal mood and affect.  Nursing note and vitals reviewed.  No results found for this or any previous visit.    Assessment & Plan:   Problem List Items Addressed This Visit    Right-sided chest wall pain - Primary    Ribcage strain vs rib fractures. Right rib xrays to r/o flail chest although doubt given stability with breathing. Supportive care discussed as per instructions. Anticipate 4-6 wk recovery course.      Relevant Orders   DG Ribs Unilateral Right    Other Visit Diagnoses   None.      Follow up plan: Return if symptoms worsen or fail to improve.  Eustaquio BoydenJavier Makya Phillis, MD

## 2016-06-11 NOTE — Patient Instructions (Signed)
Xray of right side of ribs today. Rib fracture vs rib bone bruise.  Treat with tylenol or ibuprofen as needed, heating pad. This may take 4-6 weeks to fully heal.

## 2016-07-16 IMAGING — CR DG FINGER THUMB 2+V*R*
3 series · 3 of 3 positions shown · non-contrast
Comparison: No priors.

CLINICAL DATA: 58-year-old male with crush injury to the right
thumb. Pain on the posterior aspect of the right thumb.

EXAM:
RIGHT THUMB 2+V

[finger ap]
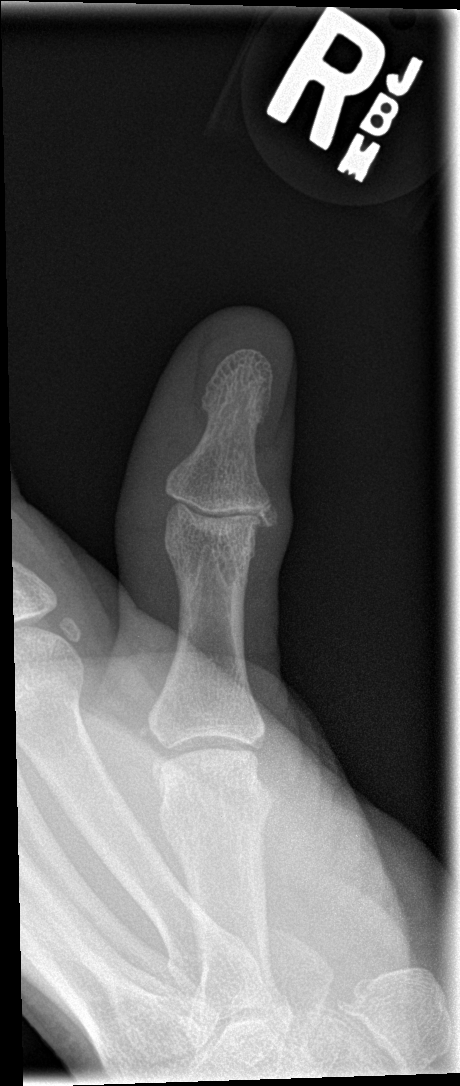

[finger obl]
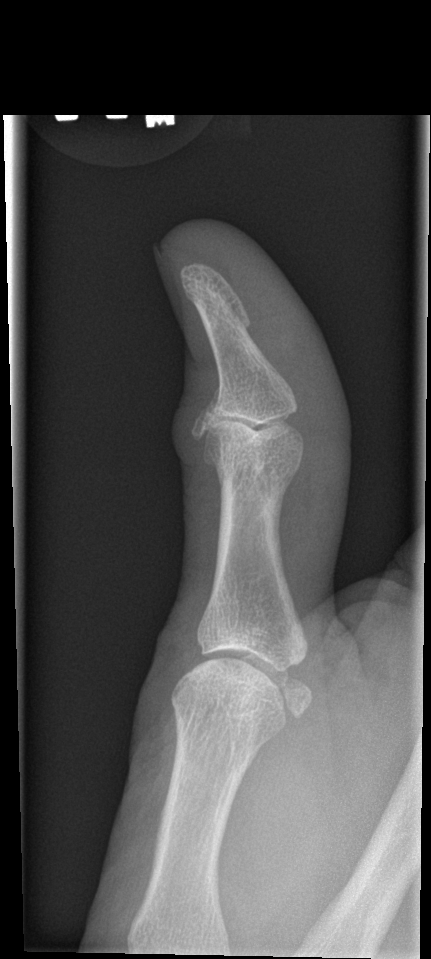

[finger lat]
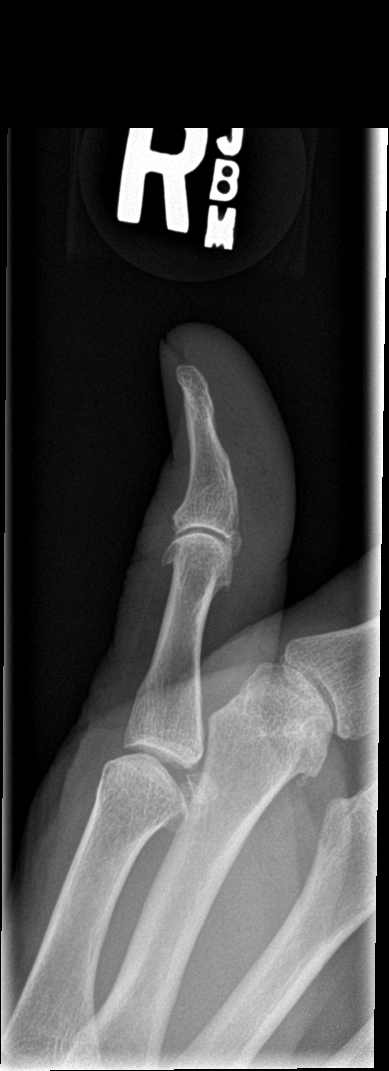

[3 of 3 positions shown; findings below may reference images not displayed]

FINDINGS: Three views of the right thumb demonstrate no acute displaced
fracture, subluxation or disc dictation. There is joint space
narrowing, subchondral sclerosis, subchondral cyst formation and
osteophyte formation, compatible with osteoarthritis at the
interphalangeal joint.
IMPRESSION: 1. No acute radiographic abnormality of the right thumb.
2. Moderate osteoarthritis at the right first interphalangeal joint
appear

## 2016-08-26 ENCOUNTER — Other Ambulatory Visit: Payer: Self-pay | Admitting: Family Medicine

## 2016-08-26 DIAGNOSIS — E785 Hyperlipidemia, unspecified: Secondary | ICD-10-CM

## 2016-08-26 DIAGNOSIS — Z1159 Encounter for screening for other viral diseases: Secondary | ICD-10-CM

## 2016-08-26 DIAGNOSIS — Z125 Encounter for screening for malignant neoplasm of prostate: Secondary | ICD-10-CM

## 2016-08-28 ENCOUNTER — Other Ambulatory Visit: Payer: Self-pay

## 2016-09-02 ENCOUNTER — Ambulatory Visit (INDEPENDENT_AMBULATORY_CARE_PROVIDER_SITE_OTHER): Payer: BLUE CROSS/BLUE SHIELD | Admitting: Family Medicine

## 2016-09-02 ENCOUNTER — Encounter: Payer: Self-pay | Admitting: Family Medicine

## 2016-09-02 VITALS — BP 112/78 | HR 61 | Ht 64.25 in | Wt 166.0 lb

## 2016-09-02 DIAGNOSIS — E785 Hyperlipidemia, unspecified: Secondary | ICD-10-CM

## 2016-09-02 DIAGNOSIS — I2510 Atherosclerotic heart disease of native coronary artery without angina pectoris: Secondary | ICD-10-CM | POA: Diagnosis not present

## 2016-09-02 DIAGNOSIS — E663 Overweight: Secondary | ICD-10-CM | POA: Diagnosis not present

## 2016-09-02 DIAGNOSIS — Z125 Encounter for screening for malignant neoplasm of prostate: Secondary | ICD-10-CM

## 2016-09-02 DIAGNOSIS — Z0000 Encounter for general adult medical examination without abnormal findings: Secondary | ICD-10-CM | POA: Diagnosis not present

## 2016-09-02 DIAGNOSIS — Z1159 Encounter for screening for other viral diseases: Secondary | ICD-10-CM

## 2016-09-02 NOTE — Patient Instructions (Addendum)
Labs today.  EKG today.  Sign release for records of colonoscopy from [place].  Continue regular walking as up to now.  Work on [employer].  Return as needed or in 1 year for next physical.   Health Maintenance, Male A healthy lifestyle and preventative care can promote health and wellness.  Maintain regular health, dental, and eye exams.  Eat a healthy diet. Foods like vegetables, fruits, whole grains, low-fat dairy products, and lean protein foods contain the nutrients you need and are low in calories. Decrease your intake of foods high in solid fats, added sugars, and salt. Get information about a proper diet from your health care provider, if necessary.  Regular physical exercise is one of the most important things you can do for your health. Most adults should get at least 150 minutes of moderate-intensity exercise (any activity that increases your heart rate and causes you to sweat) each week. In addition, most adults need muscle-strengthening exercises on 2 or more days a week.   Maintain a healthy weight. The body mass index (BMI) is a screening tool to identify possible weight problems. It provides an estimate of body fat based on height and weight. Your health care provider can find your BMI and can help you achieve or maintain a healthy weight. For males 20 years and older:  A BMI below 18.5 is considered underweight.  A BMI of 18.5 to 24.9 is normal.  A BMI of 25 to 29.9 is considered overweight.  A BMI of 30 and above is considered obese.  Maintain normal blood lipids and cholesterol by exercising and minimizing your intake of saturated fat. Eat a balanced diet with plenty of fruits and vegetables. Blood tests for lipids and cholesterol should begin at age 40 and be repeated every 5 years. If your lipid or cholesterol levels are high, you are over age 76, or you are at high risk for heart disease, you may need your cholesterol levels checked more frequently.Ongoing  high lipid and cholesterol levels should be treated with medicines if diet and exercise are not working.  If you smoke, find out from your health care provider how to quit. If you do not use tobacco, do not start.  Lung cancer screening is recommended for adults aged 55-80 years who are at high risk for developing lung cancer because of a history of smoking. A yearly low-dose CT scan of the lungs is recommended for people who have at least a 30-pack-year history of smoking and are current smokers or have quit within the past 15 years. A pack year of smoking is smoking an average of 1 pack of cigarettes a day for 1 year (for example, a 30-pack-year history of smoking could mean smoking 1 pack a day for 30 years or 2 packs a day for 15 years). Yearly screening should continue until the smoker has stopped smoking for at least 15 years. Yearly screening should be stopped for people who develop a health problem that would prevent them from having lung cancer treatment.  If you choose to drink alcohol, do not have more than 2 drinks per day. One drink is considered to be 12 oz (360 mL) of beer, 5 oz (150 mL) of wine, or 1.5 oz (45 mL) of liquor.  Avoid the use of street drugs. Do not share needles with anyone. Ask for help if you need support or instructions about stopping the use of drugs.  High blood pressure causes heart disease and increases the risk  of stroke. High blood pressure is more likely to develop in:  People who have blood pressure in the end of the normal range (100-139/85-89 mm Hg).  People who are overweight or obese.  People who are African American.  If you are 7018-10439 years of age, have your blood pressure checked every 3-5 years. If you are 63 years of age or older, have your blood pressure checked every year. You should have your blood pressure measured twice-once when you are at a hospital or clinic, and once when you are not at a hospital or clinic. Record the average of the two  measurements. To check your blood pressure when you are not at a hospital or clinic, you can use:  An automated blood pressure machine at a pharmacy.  A home blood pressure monitor.  If you are 7445-63 years old, ask your health care provider if you should take aspirin to prevent heart disease.  Diabetes screening involves taking a blood sample to check your fasting blood sugar level. This should be done once every 3 years after age 63 if you are at a normal weight and without risk factors for diabetes. Testing should be considered at a younger age or be carried out more frequently if you are overweight and have at least 1 risk factor for diabetes.  Colorectal cancer can be detected and often prevented. Most routine colorectal cancer screening begins at the age of 63 and continues through age 63. However, your health care provider may recommend screening at an earlier age if you have risk factors for colon cancer. On a yearly basis, your health care provider may provide home test kits to check for hidden blood in the stool. A small camera at the end of a tube may be used to directly examine the colon (sigmoidoscopy or colonoscopy) to detect the earliest forms of colorectal cancer. Talk to your health care provider about this at age 63 when routine screening begins. A direct exam of the colon should be repeated every 5-10 years through age 63, unless early forms of precancerous polyps or small growths are found.  People who are at an increased risk for hepatitis B should be screened for this virus. You are considered at high risk for hepatitis B if:  You were born in a country where hepatitis B occurs often. Talk with your health care provider about which countries are considered high risk.  Your parents were born in a high-risk country and you have not received a shot to protect against hepatitis B (hepatitis B vaccine).  You have HIV or AIDS.  You use needles to inject street drugs.  You live  with, or have sex with, someone who has hepatitis B.  You are a man who has sex with other men (MSM).  You get hemodialysis treatment.  You take certain medicines for conditions like cancer, organ transplantation, and autoimmune conditions.  Hepatitis C blood testing is recommended for all people born from [id number] through 1965 and any individual with known risk factors for hepatitis C.  Healthy men should no longer receive prostate-specific antigen (PSA) blood tests as part of routine cancer screening. Talk to your health care provider about prostate cancer screening.  Testicular cancer screening is not recommended for adolescents or adult males who have no symptoms. Screening includes self-exam, a health care provider exam, and other screening tests. Consult with your health care provider about any symptoms you have or any concerns you have about testicular cancer.  Practice safe sex.  Use condoms and avoid high-risk sexual practices to reduce the spread of sexually transmitted infections (STIs).  You should be screened for STIs, including gonorrhea and chlamydia if:  You are sexually active and are younger than 24 years.  You are older than 24 years, and your health care provider tells you that you are at risk for this type of infection.  Your sexual activity has changed since you were last screened, and you are at an increased risk for chlamydia or gonorrhea. Ask your health care provider if you are at risk.  If you are at risk of being infected with HIV, it is recommended that you take a prescription medicine daily to prevent HIV infection. This is called pre-exposure prophylaxis (PrEP). You are considered at risk if:  You are a man who has sex with other men (MSM).  You are a heterosexual man who is sexually active with multiple partners.  You take drugs by injection.  You are sexually active with a partner who has HIV.  Talk with your health care provider about whether you are at  high risk of being infected with HIV. If you choose to begin PrEP, you should first be tested for HIV. You should then be tested every 3 months for as long as you are taking PrEP.  Use sunscreen. Apply sunscreen liberally and repeatedly throughout the day. You should seek shade when your shadow is shorter than you. Protect yourself by wearing long sleeves, pants, a wide-brimmed hat, and sunglasses year round whenever you are outdoors.  Tell your health care provider of new moles or changes in moles, especially if there is a change in shape or color. Also, tell your health care provider if a mole is larger than the size of a pencil eraser.  A one-time screening for abdominal aortic aneurysm (AAA) and surgical repair of large AAAs by ultrasound is recommended for men aged 65-75 years who are current or former smokers.  Stay current with your vaccines (immunizations). This information is not intended to replace advice given to you by your health care provider. Make sure you discuss any questions you have with your health care provider. Document Released: 02/08/2008 Document Revised: 09/02/2014 Document Reviewed: 05/16/2015 Elsevier Interactive Patient Education  2017 [employer]

## 2016-09-02 NOTE — Assessment & Plan Note (Addendum)
Update EKG today. Pt does not know details of stent, I never received records. Continue aspirin 81mg  daily.

## 2016-09-02 NOTE — Assessment & Plan Note (Signed)
Preventative protocols reviewed and updated unless pt declined. Discussed healthy diet and lifestyle.  

## 2016-09-02 NOTE — Progress Notes (Addendum)
BP 112/78   Pulse 61   Ht 5' 4.25" (1.632 m)   Wt 166 lb (75.3 kg)   SpO2 98%   BMI 28.27 kg/m    CC: CPE Subjective:    Patient ID: Roger [name], male    DOB: Aug 29, 1955, 61 y.o.   MRN: [id number]  HPI: Roger [name] is a 61 y.o. male presenting on 09/02/2016 for Annual Exam   Fasting today for labs  Preventative: COLONOSCOPY normal per patient in [place] but unsure where - requested today Prostate cancer screen - discussed, agrees to screen.  Lung cancer screening - not eligible Flu - declines Thinks UTD tetanus Seat belt use discussed Sunscreen use discussed. No changing moles on skin.  Ex smoker - quit remotely alcohol - none  Lives with wife, daughter with granddaughter Edu: some college Occupation: Therapist, art Activity: stays active at work, 20,000 steps daily  Diet: poor water, good fruits/vegetables daily   Relevant past medical, surgical, family and social history reviewed and updated as indicated. Interim medical history since our last visit reviewed. Allergies and medications reviewed and updated. Current Outpatient Prescriptions on File Prior to Visit  Medication Sig  . aspirin (ASPIRIN EC) 81 MG EC tablet Take 81 mg by mouth daily. Swallow whole.  . Multiple Vitamins-Minerals (MULTIVITAMIN ADULT PO) Take 1 tablet by mouth daily.   No current facility-administered medications on file prior to visit.     Review of Systems  Constitutional: Negative for activity change, appetite change, chills, fatigue, fever and unexpected weight change.  HENT: Negative for hearing loss.   Eyes: Negative for visual disturbance.  Respiratory: Negative for cough, chest tightness, shortness of breath and wheezing.   Cardiovascular: Negative for chest pain, palpitations and leg swelling.  Gastrointestinal: Negative for abdominal distention, abdominal pain, blood in stool, constipation, diarrhea, nausea and vomiting.  Genitourinary: Negative for difficulty  urinating and hematuria.  Musculoskeletal: Negative for arthralgias, myalgias and neck pain.  Skin: Negative for rash.  Neurological: Negative for dizziness, seizures, syncope and headaches.  Hematological: Negative for adenopathy. Does not bruise/bleed easily.  Psychiatric/Behavioral: Negative for dysphoric mood. The patient is not nervous/anxious.    Per HPI unless specifically indicated in ROS section     Objective:    BP 112/78   Pulse 61   Ht 5' 4.25" (1.632 m)   Wt 166 lb (75.3 kg)   SpO2 98%   BMI 28.27 kg/m   Wt Readings from Last 3 Encounters:  09/02/16 166 lb (75.3 kg)  06/11/16 167 lb 8 oz (76 kg)  01/23/16 172 lb 8 oz (78.2 kg)    Physical Exam  Constitutional: He is oriented to person, place, and time. He appears well-developed and well-nourished. No distress.  HENT:  Head: Normocephalic and atraumatic.  Right Ear: Hearing, tympanic membrane, external ear and ear canal normal.  Left Ear: Hearing, tympanic membrane, external ear and ear canal normal.  Nose: Nose normal.  Mouth/Throat: Uvula is midline, oropharynx is clear and moist and mucous membranes are normal. No oropharyngeal exudate, posterior oropharyngeal edema or posterior oropharyngeal erythema.  Eyes: Conjunctivae and EOM are normal. Pupils are equal, round, and reactive to light. No scleral icterus.  Neck: Normal range of motion. Neck supple. Carotid bruit is not present. No thyromegaly present.  Cardiovascular: Normal rate, regular rhythm, normal heart sounds and intact distal pulses.   No murmur heard. Pulses:      Radial pulses are 2+ on the right side, and 2+ on the left side.  Pulmonary/Chest: Effort normal and breath sounds normal. No respiratory distress. He has no wheezes. He has no rales.  Abdominal: Soft. Bowel sounds are normal. He exhibits no distension and no mass. There is no tenderness. There is no rebound and no guarding.  Genitourinary: Rectum normal. Rectal exam shows no external  hemorrhoid, no internal hemorrhoid, no fissure, no mass, no tenderness and anal tone normal. Prostate is enlarged (25gm with mild enlargement of R lobe). Prostate is not tender.  Musculoskeletal: Normal range of motion. He exhibits no edema.  Lymphadenopathy:    He has no cervical adenopathy.  Neurological: He is alert and oriented to person, place, and time.  CN grossly intact, station and gait intact  Skin: Skin is warm and dry. No rash noted.  Psychiatric: He has a normal mood and affect. His behavior is normal. Judgment and thought content normal.  Nursing note and vitals reviewed.  No results found for this or any previous visit.    EKG - sinus bradycardia 55, normal axis, intervals, no acute ST/T changes, good R wave progression. Assessment & Plan:   Problem List Items Addressed This Visit    CAD (coronary artery disease)    Update EKG today. Pt does not know details of stent, I never received records. Continue aspirin 81mg  daily.       Relevant Orders   EKG 12-Lead   Health maintenance examination - Primary    Preventative protocols reviewed and updated unless pt declined. Discussed healthy diet and lifestyle.       HLD (hyperlipidemia)    Update labs today.       Overweight    Discussed healthy diet and lifestyle changes to affect sustainable weight loss.       Other Visit Diagnoses    Need for hepatitis C screening test       Special screening for malignant neoplasm of prostate           Follow up plan: Return in about 1 year (around 09/02/2017) for annual exam, prior fasting for blood work.  Eustaquio BoydenJavier Robt Okuda, MD

## 2016-09-02 NOTE — Assessment & Plan Note (Signed)
Update labs today 

## 2016-09-02 NOTE — Assessment & Plan Note (Signed)
Discussed healthy diet and lifestyle changes to affect sustainable weight loss  

## 2016-09-03 LAB — COMPREHENSIVE METABOLIC PANEL
ALBUMIN: 4.1 g/dL (ref 3.5–5.2)
ALT: 13 U/L (ref 0–53)
AST: 12 U/L (ref 0–37)
Alkaline Phosphatase: 110 U/L (ref 39–117)
BUN: 15 mg/dL (ref 6–23)
CALCIUM: 9.1 mg/dL (ref 8.4–10.5)
CHLORIDE: 106 meq/L (ref 96–112)
CO2: 28 meq/L (ref 19–32)
Creatinine, Ser: 0.81 mg/dL (ref 0.40–1.50)
GFR: 102.57 mL/min (ref >60.00)
Glucose, Bld: 79 mg/dL (ref 70–99)
POTASSIUM: 4.2 meq/L (ref 3.5–5.1)
SODIUM: 141 meq/L (ref 135–145)
Total Bilirubin: 0.7 mg/dL (ref 0.2–1.2)
Total Protein: 7.2 g/dL (ref 6.0–8.3)

## 2016-09-03 LAB — LIPID PANEL
CHOL/HDL RATIO: 4
Cholesterol: 158 mg/dL (ref 0–200)
HDL: 35.4 mg/dL — ABNORMAL LOW (ref >39.00)
LDL CALC: 96 mg/dL (ref 0–99)
NONHDL: 122.46
TRIGLYCERIDES: 130 mg/dL (ref 0.0–149.0)
VLDL: 26 mg/dL (ref 0.0–40.0)

## 2016-09-03 LAB — HEPATITIS C ANTIBODY: HCV Ab: NEGATIVE

## 2016-09-03 LAB — PSA: PSA: 0.41 ng/mL (ref 0.10–4.00)

## 2017-01-14 ENCOUNTER — Ambulatory Visit
Admission: RE | Admit: 2017-01-14 | Discharge: 2017-01-14 | Disposition: A | Discharge status: Home / Self Care | Payer: BLUE CROSS/BLUE SHIELD | Source: Ambulatory Visit | Attending: Family Medicine | Admitting: Family Medicine

## 2017-01-14 ENCOUNTER — Encounter: Payer: Self-pay | Admitting: Family Medicine

## 2017-01-14 ENCOUNTER — Ambulatory Visit (INDEPENDENT_AMBULATORY_CARE_PROVIDER_SITE_OTHER): Payer: BLUE CROSS/BLUE SHIELD | Admitting: Family Medicine

## 2017-01-14 VITALS — BP 130/84 | HR 88 | Ht 64.25 in | Wt 171.5 lb

## 2017-01-14 DIAGNOSIS — M25511 Pain in right shoulder: Principal | ICD-10-CM

## 2017-01-14 DIAGNOSIS — G8929 Other chronic pain: Secondary | ICD-10-CM

## 2017-01-14 DIAGNOSIS — M25512 Pain in left shoulder: Principal | ICD-10-CM

## 2017-01-14 DIAGNOSIS — I2510 Atherosclerotic heart disease of native coronary artery without angina pectoris: Secondary | ICD-10-CM

## 2017-01-14 DIAGNOSIS — R42 Dizziness and giddiness: Secondary | ICD-10-CM | POA: Diagnosis not present

## 2017-01-14 DIAGNOSIS — E785 Hyperlipidemia, unspecified: Secondary | ICD-10-CM | POA: Diagnosis not present

## 2017-01-14 MED ORDER — ATORVASTATIN CALCIUM 40 MG PO TABS: 40.0000 mg | ORAL_TABLET | Freq: Every day | ORAL | 3 refills | Status: DC

## 2017-01-14 NOTE — Progress Notes (Signed)
BP 134/90   Pulse 77   Ht 5' 4.25" (1.632 m)   Wt 171 lb 8 oz (77.8 kg)   SpO2 99%   BMI 29.21 kg/m    CC: diziness Subjective:    Patient ID: Roger MalkinJohn Terris, male    DOB: 08/11/1956, 61 y.o.   MRN: [id number]  HPI: Roger [name] is a 61 y.o. male presenting on 01/14/2017 for Acute Visit (X 6 DAYS BALANCE OFF, NAUSEATED, LIGHT HEAD, DIZZINESS)   [facility]. He perceived he had fallen and hit the ground - but never actually fell. Intermittent lightheadedness and nausea over last few months - this happens every other day. No actual LOC or trauma. Stayed home the next day. He had breakfast and lunch that day. Episode happened at 5:30pm.   Denies fevers/chills, chest pain, tightness, dyspnea, cough, cold symptoms, palpitations, headaches, hearing changes (wears hearing aides).  Denies one sided weakness or numbness, confusion, slurred speech, facial weakness.  No presyncope or vertigo.  Thinks he's staying well hydrated.   He is not taking lipitor. Has CAD history.   Ongoing bilateral shoulder pain - previously thought shoulder bursitis. No improvement with PT course 12/2015.   Relevant past medical, surgical, family and social history reviewed and updated as indicated. Interim medical history since our last visit reviewed. Allergies and medications reviewed and updated. Outpatient Medications Prior to Visit  Medication Sig Dispense Refill  . aspirin (ASPIRIN EC) 81 MG EC tablet Take 81 mg by mouth daily. Swallow whole.    . Multiple Vitamins-Minerals (MULTIVITAMIN ADULT PO) Take 1 tablet by mouth daily.     No facility-administered medications prior to visit.      Per HPI unless specifically indicated in ROS section below Review of Systems     Objective:    BP 134/90   Pulse 77   Ht 5' 4.25" (1.632 m)   Wt 171 lb 8 oz (77.8 kg)   SpO2 99%   BMI 29.21 kg/m   Wt Readings from Last 3 Encounters:  01/14/17 171  lb 8 oz (77.8 kg)  09/02/16 166 lb (75.3 kg)  06/11/16 167 lb 8 oz (76 kg)    Physical Exam  Constitutional: He appears well-developed and well-nourished. No distress.  HENT:  Mouth/Throat: Oropharynx is clear and moist.  Neck: Neck supple. Carotid bruit is not present.  Cardiovascular: Normal rate, regular rhythm, normal heart sounds and intact distal pulses.   No murmur heard. Pulmonary/Chest: Effort normal and breath sounds normal. No respiratory distress. He has no wheezes. He has no rales.  Musculoskeletal: He exhibits no edema.  Skin: Skin is warm and dry. No rash noted.  Psychiatric: He has a normal mood and affect.  Nursing note and vitals reviewed.  Results for orders placed or performed in visit on 09/02/16  Lipid panel  Result Value Ref Range   Cholesterol 158 0 - 200 mg/dL   Triglycerides 409.8130.0 0.0 - 149.0 mg/dL   HDL 11.9135.40 (L) >47.82>39.00 mg/dL   VLDL 95.626.0 0.0 - 21.340.0 mg/dL   LDL Cholesterol 96 0 - 99 mg/dL   Total CHOL/HDL Ratio 4    NonHDL 122.46   Hepatitis C antibody  Result Value Ref Range   HCV Ab NEGATIVE NEGATIVE  Comprehensive metabolic panel  Result Value Ref Range   Sodium 141 135 - 145 mEq/L   Potassium 4.2 3.5 - 5.1 mEq/L   Chloride 106 96 - 112 mEq/L  CO2 28 19 - 32 mEq/L   Glucose, Bld 79 70 - 99 mg/dL   BUN 15 6 - 23 mg/dL   Creatinine, Ser 1.61 0.40 - 1.50 mg/dL   Total Bilirubin 0.7 0.2 - 1.2 mg/dL   Alkaline Phosphatase 110 39 - 117 U/L   AST 12 0 - 37 U/L   ALT 13 0 - 53 U/L   Total Protein 7.2 6.0 - 8.3 g/dL   Albumin 4.1 3.5 - 5.2 g/dL   Calcium 9.1 8.4 - 09.6 mg/dL   GFR 045.40 >98.11 mL/min  PSA  Result Value Ref Range   PSA 0.41 0.10 - 4.00 ng/mL      Assessment & Plan:   Problem List Items Addressed This Visit    CAD (coronary artery disease)   Relevant Medications   atorvastatin (LIPITOR) 40 MG tablet   Dizziness - Primary    Describes lightheaded episodes over last few months, acutely worse last week - sensation of fall  without actual fall. Not consistent with presyncope or vertigo. Doubt hypoglycemia although this is possibility. Check orthostatics today. Discussed hydration status. Reviewed EKG from 08/2016 - sinus brady and otherwise normal.       HLD (hyperlipidemia)    In h/o CAD, discussed LDL goal <70. Will start statin. He has tolerated this well in the past.       Relevant Medications   atorvastatin (LIPITOR) 40 MG tablet   Shoulder pain, bilateral    Ongoing shoulder pain now bilateral - will order xrays to eval arthritic burden.      Relevant Orders   DG Shoulder Left   DG Shoulder Right       Follow up plan: Return if symptoms worsen or fail to improve.  Eustaquio Boyden, MD

## 2017-01-14 NOTE — Assessment & Plan Note (Signed)
Describes lightheaded episodes over last few months, acutely worse last week - sensation of fall without actual fall. Not consistent with presyncope or vertigo. Doubt hypoglycemia although this is possibility. Check orthostatics today. Discussed hydration status. Reviewed EKG from 08/2016 - sinus brady and otherwise normal.

## 2017-01-14 NOTE — Patient Instructions (Addendum)
Start cholesterol medicine.  I'm not sure where this episode came from. Possible dehydration. I recommend staying well hydrated throughout the day. If you feel dizzy or lightheaded, drink water and take a seat. If recurrent episode, let us know.

## 2017-01-14 NOTE — Assessment & Plan Note (Signed)
In h/o CAD, discussed LDL goal <70. Will start statin. He has tolerated this well in the past.

## 2017-01-14 NOTE — Assessment & Plan Note (Signed)
Ongoing shoulder pain now bilateral - will order xrays to eval arthritic burden.

## 2018-02-26 ENCOUNTER — Emergency Department
Admission: EM | Admit: 2018-02-26 | Discharge: 2018-02-26 | Disposition: A | Discharge status: Home / Self Care | Payer: BLUE CROSS/BLUE SHIELD | Attending: Student in an Organized Health Care Education/Training Program | Admitting: Student in an Organized Health Care Education/Training Program

## 2018-02-26 DIAGNOSIS — Y999 Unspecified external cause status: Secondary | ICD-10-CM | POA: Diagnosis not present

## 2018-02-26 DIAGNOSIS — S50861A Insect bite (nonvenomous) of right forearm, initial encounter: Secondary | ICD-10-CM | POA: Diagnosis present

## 2018-02-26 DIAGNOSIS — Z87891 Personal history of nicotine dependence: Secondary | ICD-10-CM | POA: Diagnosis not present

## 2018-02-26 DIAGNOSIS — I259 Chronic ischemic heart disease, unspecified: Secondary | ICD-10-CM | POA: Insufficient documentation

## 2018-02-26 DIAGNOSIS — Y93H2 Activity, gardening and landscaping: Secondary | ICD-10-CM | POA: Diagnosis not present

## 2018-02-26 DIAGNOSIS — Y92017 Garden or yard in single-family (private) house as the place of occurrence of the external cause: Secondary | ICD-10-CM | POA: Insufficient documentation

## 2018-02-26 DIAGNOSIS — W57XXXA Bitten or stung by nonvenomous insect and other nonvenomous arthropods, initial encounter: Secondary | ICD-10-CM | POA: Insufficient documentation

## 2018-02-26 DIAGNOSIS — Z7982 Long term (current) use of aspirin: Secondary | ICD-10-CM | POA: Insufficient documentation

## 2018-02-26 MED ORDER — HYDROCORTISONE 1 % EX LOTN: 1.0000 "application " | TOPICAL_LOTION | Freq: Two times a day (BID) | CUTANEOUS | 0 refills | Status: DC

## 2018-02-26 NOTE — ED Triage Notes (Signed)
Pt reports that he felt something "prick" him while he was working with mulch at around 11am.  Pt states he did not notice the bite area until he woke up approximately 7pm.  Pt states it has not gotten any worse since then.  Pt is A&Ox4, in NAD at this time.

## 2018-02-26 NOTE — ED Provider Notes (Signed)
[facility] Emergency Department Provider Note  ____________________________________________  Time seen: Approximately 11:00 PM  I have reviewed the triage vital signs and the nursing notes.   HISTORY  Chief Complaint Insect Bite    HPI Roger [name] is a 62 y.o. male presents to the emergency department with an insect bite on the volar right forearm with mild surrounding ecchymosis that developed today after patient was spreading mulch.  Patient denies pain, pruritus or fever.  Patient noticed ecchymosis and became concerned.  No alleviating measures have been attempted.   Past Medical History:  Diagnosis Date  . Bell's palsy x2   recurrent  . CAD (coronary artery disease) 2004   with stent, unclear details  . History of chicken pox   . History of hypothyroidism as a child  . HLD (hyperlipidemia)     Patient Active Problem List   Diagnosis Date Noted  . Dizziness 01/14/2017  . Health maintenance examination 09/02/2016  . Shoulder pain, bilateral 01/23/2016  . Left-sided low back pain without sciatica 01/23/2016  . Overweight 03/09/2015  . CAD (coronary artery disease)   . HLD (hyperlipidemia)     Past Surgical History:  Procedure Laterality Date  . COLONOSCOPY     normal per patient  . PERCUTANEOUS CORONARY STENT INTERVENTION (PCI-S)  11/08/02   [place]  . TONSILLECTOMY AND ADENOIDECTOMY Bilateral     Prior to Admission medications   Medication Sig Start Date End Date Taking? Authorizing Provider  aspirin (ASPIRIN EC) 81 MG EC tablet Take 81 mg by mouth daily. Swallow whole.    [provider]  atorvastatin (LIPITOR) 40 MG tablet Take 1 tablet (40 mg total) by mouth daily. 01/14/17   Eustaquio Boyden, MD  hydrocortisone 1 % lotion Apply 1 application topically 2 (two) times daily. 02/26/18   Orvil Feil, PA-C  Multiple Vitamins-Minerals (MULTIVITAMIN ADULT PO) Take 1 tablet by mouth daily.    [provider]     Allergies Patient has no known allergies.  Family History  Problem Relation Age of Onset  . Cancer Mother 54       colon  . Cancer Father 10       prostate  . CAD Father 40       massive MI  . CAD Paternal Uncle   . Alcoholism Paternal Uncle   . Stroke Paternal Uncle   . Diabetes Other        maternal side    Social History Social History   Tobacco Use  . Smoking status: Former Smoker    Last attempt to quit: 08/26/1982    Years since quitting: 35.5  . Smokeless tobacco: Never Used  Substance Use Topics  . Alcohol use: No    Alcohol/week: 0.0 oz  . Drug use: No     Review of Systems  Constitutional: No fever/chills Eyes: No visual changes. No discharge ENT: No upper respiratory complaints. Cardiovascular: no chest pain. Respiratory: no cough. No SOB. Gastrointestinal: No abdominal pain.  No nausea, no vomiting.  No diarrhea.  No constipation. Musculoskeletal: Negative for musculoskeletal pain. Skin: Patient has infect bite.  Neurological: Negative for headaches, focal weakness or numbness.   ____________________________________________   PHYSICAL EXAM:  VITAL SIGNS: ED Triage Vitals [02/26/18 2238]  Enc Vitals Group     BP (!) 182/80     Pulse Rate 67     Resp 18     Temp 97.6 F (36.4 C)     Temp Source Oral  SpO2 97 %     Weight 173 lb (78.5 kg)     Height 5\' 5"  (1.651 m)     Head Circumference      Peak Flow      Pain Score 0     Pain Loc      Pain Edu?      Excl. in GC?      Constitutional: Alert and oriented. Well appearing and in no acute distress. Eyes: Conjunctivae are normal. PERRL. EOMI. Head: Atraumatic. Cardiovascular: Normal rate, regular rhythm. Normal S1 and S2.  Good peripheral circulation. Respiratory: Normal respiratory effort without tachypnea or retractions. Lungs CTAB. Good air entry to the bases with no decreased or absent breath sounds. Musculoskeletal: Full range of motion to all extremities. No gross  deformities appreciated. Neurologic:  Normal speech and language. No gross focal neurologic deficits are appreciated.  Skin: Patient has 0.5 cm region of circumferential erythema with a small puncture mark visualized.  Patient has mild surrounding ecchymosis. Psychiatric: Mood and affect are normal. Speech and behavior are normal. Patient exhibits appropriate insight and judgement.   ____________________________________________   LABS (all labs ordered are listed, but only abnormal results are displayed)  Labs Reviewed - No data to display ____________________________________________  EKG   ____________________________________________  RADIOLOGY   No results found.  ____________________________________________    PROCEDURES  Procedure(s) performed:    Procedures    Medications - No data to display   ____________________________________________   INITIAL IMPRESSION / ASSESSMENT AND PLAN / ED COURSE  Pertinent labs & imaging results that were available during my care of the patient were reviewed by me and considered in my medical decision making (see chart for details).  Review of the [place] CSRS was performed in accordance of the [facility] prior to dispensing any controlled drugs.    Assessment and Plan:  Insect bite Patient presents to the emergency department with an insect bite on the right forearm.  Physical exam was completely reassuring and patient was discharged with topical hydrocortisone.  He was advised to follow-up with primary care as needed.  All patient questions were answered.    ____________________________________________  FINAL CLINICAL IMPRESSION(S) / ED DIAGNOSES  Final diagnoses:  Insect bite of right forearm, initial encounter      NEW MEDICATIONS STARTED DURING THIS VISIT:  ED Discharge Orders        Ordered    hydrocortisone 1 % lotion  2 times daily     02/26/18 2256          This chart was dictated using voice recognition  software/Dragon. Despite best efforts to proofread, errors can occur which can change the meaning. Any change was purely unintentional.    Orvil FeilWoods, Jaclyn M, PA-C 02/26/18 2308    Willy Eddyobinson, Patrick, MD 02/26/18 [phone]

## 2018-02-26 NOTE — ED Notes (Signed)
This RN attempted to reach employer [employer] on [address] in [place] at [phone]. Unable to get any one to answer the phone for over 5 mins. Ineligibility form placed on chart and copy given to pt.

## 2019-05-30 ENCOUNTER — Telehealth: Payer: BLUE CROSS/BLUE SHIELD | Admitting: Family

## 2019-05-30 DIAGNOSIS — Z20822 Contact with and (suspected) exposure to covid-19: Secondary | ICD-10-CM

## 2019-05-30 NOTE — Progress Notes (Signed)
E-Visit for Corona Virus Screening   Your current symptoms could be consistent with the coronavirus.  Many health care providers can now test patients at their office but not all are.  [facility] has multiple testing sites. For information on our COVID testing locations and hours go to [email or web address]  Please quarantine yourself while awaiting your test results. The testing center will re-open tomorrow at [place] We are enrolling you in our [place] for [place] . Daily you will receive a questionnaire within the [facility] website. Our COVID 19 response team willl be monitoriing your responses daily.    COVID-19 is a respiratory illness with symptoms that are similar to the flu. Symptoms are typically mild to moderate, but there have been cases of severe illness and death due to the virus. The following symptoms may appear 2-14 days after exposure: . Fever . Cough . Shortness of breath or difficulty breathing . Chills . Repeated shaking with chills . Muscle pain . Headache . Sore throat . New loss of taste or smell . Fatigue . Congestion or runny nose . Nausea or vomiting . Diarrhea  It is vitally important that if you feel that you have an infection such as this virus or any other virus that you stay home and away from places where you may spread it to others.  You should self-quarantine for 14 days if you have symptoms that could potentially be coronavirus or have been in close contact a with a person diagnosed with COVID-19 within the last 2 weeks. You should avoid contact with people age 56 and older.   You should wear a mask or cloth face covering over your nose and mouth if you must be around other people or animals, including pets (even at home). Try to stay at least 6 feet away from other people. This will protect the people around you.   You may also take acetaminophen (Tylenol) as needed for fever.   Reduce your  risk of any infection by using the same precautions used for avoiding the common cold or flu:  Mar[place] Kitchen Wash your hands often with soap and warm water for at least 20 seconds.  If soap and water are not readily available, use an alcohol-based hand sanitizer with at least 60% alcohol.  . If coughing or sneezing, cover your mouth and nose by coughing or sneezing into the elbow areas of your shirt or coat, into a tissue or into your sleeve (not your hands). . Avoid shaking hands with others and consider head nods or verbal greetings only. . Avoid touching your eyes, nose, or mouth with unwashed hands.  . Avoid close contact with people who are sick. . Avoid places or events with large numbers of people in one location, like concerts or sporting events. . Carefully consider travel plans you have or are making. . If you are planning any travel outside or inside the [place], visit the CDC's Travelers' Health webpage for the latest health notices. . If you have some symptoms but not all symptoms, continue to monitor at home and seek medical attention if your symptoms worsen. . If you are having a medical emergency, call 911.  HOME CARE . Only take medications as instructed by your medical team. . Drink plenty of fluids and get plenty of rest. . A steam or ultrasonic humidifier can help if you have congestion.   GET HELP RIGHT AWAY IF YOU HAVE EMERGENCY WARNING SIGNS** FOR COVID-19. If you or someone  is showing any of these signs seek emergency medical care immediately. Call 911 or proceed to your closest emergency facility if: . You develop worsening high fever. . Trouble breathing . Bluish lips or face . Persistent pain or pressure in the chest . New confusion . Inability to wake or stay awake . You cough up blood. . Your symptoms become more severe  **This list is not all possible symptoms. Contact your medical provider for any symptoms that are sever or concerning to you.   MAKE SURE YOU   Understand  these instructions.  Will watch your condition.  Will get help right away if you are not doing well or get worse.  Your e-visit answers were reviewed by a board certified advanced clinical practitioner to complete your personal care plan.  Depending on the condition, your plan could have included both over the counter or prescription medications.  If there is a problem please reply once you have received a response from your provider.  Your safety is important to [place].  If you have drug allergies check your prescription carefully.    You can use MyChart to ask questions about today's visit, request a non-urgent call back, or ask for a work or school excuse for 24 hours related to this e-Visit. If it has been greater than 24 hours you will need to follow up with your provider, or enter a new e-Visit to address those concerns. You will get an e-mail in the next two days asking about your experience.  I hope that your e-visit has been valuable and will speed your recovery. Thank you for using e-visits.

## 2019-08-08 ENCOUNTER — Encounter: Payer: Self-pay | Admitting: Family Medicine

## 2019-08-09 NOTE — Telephone Encounter (Signed)
Plz call to schedule OV. 30 min if able.

## 2019-08-11 NOTE — Telephone Encounter (Signed)
Lvm for pt to call back.  Needs to schedule a 30 min in office visit for abd issues and low back pain.

## 2019-08-12 NOTE — Telephone Encounter (Signed)
Unable to reach patient for the whole week. They have not seen my mychart message.  Please call for update on symptoms and schedule ASAP evaluation. If worsening symptoms, depending on severity rec UCC or ER eval.

## 2019-08-12 NOTE — Telephone Encounter (Signed)
Lvm for pt to call back.  Needs to schedule a 30 min in office visit for abd issues and low back pain.  

## 2019-08-12 NOTE — Telephone Encounter (Addendum)
Unable to reach pt by phone; left v/m requesting cb. I tried calling Zander Ingham and her # is not in service. Per DPR I tried calling Pamala Hurry Raftery and recording person is not available and try call later.

## 2019-08-12 NOTE — Telephone Encounter (Signed)
Lvm asking pt to call back.  Still need to schedule a 30 min in office visit.

## 2019-08-13 NOTE — Telephone Encounter (Signed)
Scheduled on 08/16/19 at 9:30.

## 2019-08-16 ENCOUNTER — Encounter: Payer: Self-pay | Admitting: Family Medicine

## 2019-08-16 ENCOUNTER — Other Ambulatory Visit: Payer: Self-pay

## 2019-08-16 ENCOUNTER — Ambulatory Visit (INDEPENDENT_AMBULATORY_CARE_PROVIDER_SITE_OTHER): Payer: BC Managed Care – PPO | Admitting: Family Medicine

## 2019-08-16 VITALS — BP 140/82 | HR 88 | Temp 97.9°F | Ht 65.0 in | Wt 187.2 lb

## 2019-08-16 DIAGNOSIS — R110 Nausea: Secondary | ICD-10-CM | POA: Insufficient documentation

## 2019-08-16 DIAGNOSIS — E785 Hyperlipidemia, unspecified: Secondary | ICD-10-CM

## 2019-08-16 LAB — CBC WITH DIFFERENTIAL/PLATELET
Basophils Absolute: 0 10*3/uL (ref 0.0–0.1)
Basophils Relative: 0.6 % (ref 0.0–3.0)
Eosinophils Absolute: 0.3 10*3/uL (ref 0.0–0.7)
Eosinophils Relative: 4 % (ref 0.0–5.0)
HCT: 41.5 % (ref 39.0–52.0)
Hemoglobin: 14.1 g/dL (ref 13.0–17.0)
Lymphocytes Relative: 32.2 % (ref 12.0–46.0)
Lymphs Abs: 2.2 10*3/uL (ref 0.7–4.0)
MCHC: 34 g/dL (ref 30.0–36.0)
MCV: 92.3 fl (ref 78.0–100.0)
Monocytes Absolute: 0.3 10*3/uL (ref 0.1–1.0)
Monocytes Relative: 5.1 % (ref 3.0–12.0)
Neutro Abs: 3.9 10*3/uL (ref 1.4–7.7)
Neutrophils Relative %: 58.1 % (ref 43.0–77.0)
Platelets: 206 10*3/uL (ref 150.0–400.0)
RBC: 4.5 Mil/uL (ref 4.22–5.81)
RDW: 14 % (ref 11.5–15.5)
WBC: 6.8 10*3/uL (ref 4.0–10.5)

## 2019-08-16 LAB — COMPREHENSIVE METABOLIC PANEL
ALT: 26 U/L (ref 0–53)
AST: 23 U/L (ref 0–37)
Albumin: 4.3 g/dL (ref 3.5–5.2)
Alkaline Phosphatase: 96 U/L (ref 39–117)
BUN: 17 mg/dL (ref 6–23)
CO2: 29 mEq/L (ref 19–32)
Calcium: 9.1 mg/dL (ref 8.4–10.5)
Chloride: 103 mEq/L (ref 96–112)
Creatinine, Ser: 0.98 mg/dL (ref 0.40–1.50)
GFR: 76.73 mL/min (ref >60.00)
Glucose, Bld: 154 mg/dL — ABNORMAL HIGH (ref 70–99)
Potassium: 3.9 mEq/L (ref 3.5–5.1)
Sodium: 139 mEq/L (ref 135–145)
Total Bilirubin: 0.5 mg/dL (ref 0.2–1.2)
Total Protein: 7.3 g/dL (ref 6.0–8.3)

## 2019-08-16 LAB — POC URINALSYSI DIPSTICK (AUTOMATED)
Bilirubin, UA: NEGATIVE
Blood, UA: NEGATIVE
Glucose, UA: NEGATIVE
Ketones, UA: NEGATIVE
Leukocytes, UA: NEGATIVE
Nitrite, UA: NEGATIVE
Protein, UA: NEGATIVE
Spec Grav, UA: 1.015 (ref 1.010–1.025)
Urobilinogen, UA: 0.2 E.U./dL
pH, UA: 6 (ref 5.0–8.0)

## 2019-08-16 LAB — LDL CHOLESTEROL, DIRECT: Direct LDL: 106 mg/dL

## 2019-08-16 LAB — LIPASE: Lipase: 17 U/L (ref 11.0–59.0)

## 2019-08-16 MED ORDER — OMEPRAZOLE 40 MG PO CPDR: 40.0000 mg | DELAYED_RELEASE_CAPSULE | Freq: Every day | ORAL | 1 refills | Status: DC

## 2019-08-16 MED ORDER — ATORVASTATIN CALCIUM 20 MG PO TABS: 20.0000 mg | ORAL_TABLET | Freq: Every day | ORAL | 3 refills | Status: DC

## 2019-08-16 NOTE — Progress Notes (Signed)
This visit was conducted in person.  BP 140/82 (BP Location: Left Arm, Patient Position: Sitting, Cuff Size: Normal)   Pulse 88   Temp 97.9 F (36.6 C) (Temporal)   Ht 5\' 5"  (1.651 m)   Wt 187 lb 3 oz (84.9 kg) Comment: Wearing boot on left arm  SpO2 97%   BMI 31.15 kg/m    CC: nausea, back pain Subjective:    Patient ID: , male    DOB: 20-Feb-1956, 63 y.o.   MRN: [id number]  HPI: Devantae Babe is a 63 y.o. male presenting on 08/16/2019 for Nausea (C/o nausea for about 1 mo.  Vomited mucous on 08/12/19.  ) and Back Pain (C/o low left back pain, off and on.  Started about 1 mo ago. )   Last seen 12/2016.  Did not start lipitor.   1 mo h/o nausea (lower abdomen) associated with 1 episode of vomiting green tinged emesis. Wife has noticed abdominal distension. He denies abdominal pain. Increased burping noted as well. Nausea doesn't seem to be related to food intake, worse in the evenings (works 3rd shift).   No fevers/chills, diarrhea, constipation, bowel changes, blood in stool, chest pain, dyspnea or cough. No early satiety, appetite comes and goes. No dysphagia. Normal stools - 2 soft formed stools/day. No GERD symptoms. No recent diet changes. Stools are not more buoyant or pale.   Caffeine - 1-2 2L pepsi/day.  Alcohol - none NSAIDs - rare  Also with some intermittent L lower back pain.   Broke L foot at work - has boot in place. More sedentary.     Relevant past medical, surgical, family and social history reviewed and updated as indicated. Interim medical history since our last visit reviewed. Allergies and medications reviewed and updated. Outpatient Medications Prior to Visit  Medication Sig Dispense Refill  . aspirin (ASPIRIN EC) 81 MG EC tablet Take 81 mg by mouth daily. Swallow whole.    . hydrocortisone 1 % lotion Apply 1 application topically 2 (two) times daily. 118 mL 0  . Multiple Vitamins-Minerals (MULTIVITAMIN ADULT PO) Take 1 tablet by mouth  daily.    01/2017 atorvastatin (LIPITOR) 40 MG tablet Take 1 tablet (40 mg total) by mouth daily. (Patient not taking: Reported on 08/16/2019) 90 tablet 3   No facility-administered medications prior to visit.     Per HPI unless specifically indicated in ROS section below Review of Systems Objective:    BP 140/82 (BP Location: Left Arm, Patient Position: Sitting, Cuff Size: Normal)   Pulse 88   Temp 97.9 F (36.6 C) (Temporal)   Ht 5\' 5"  (1.651 m)   Wt 187 lb 3 oz (84.9 kg) Comment: Wearing boot on left arm  SpO2 97%   BMI 31.15 kg/m   Wt Readings from Last 3 Encounters:  08/16/19 187 lb 3 oz (84.9 kg)  02/26/18 173 lb (78.5 kg)  01/14/17 171 lb 8 oz (77.8 kg)    Physical Exam Vitals and nursing note reviewed.  Constitutional:      Appearance: Normal appearance. He is obese. He is not ill-appearing.  HENT:     Mouth/Throat:     Mouth: Mucous membranes are moist.     Pharynx: No posterior oropharyngeal erythema.  Eyes:     Extraocular Movements: Extraocular movements intact.     Pupils: Pupils are equal, round, and reactive to light.  Cardiovascular:     Rate and Rhythm: Normal rate and regular rhythm.     Pulses: Normal  pulses.     Heart sounds: Normal heart sounds. No murmur.  Pulmonary:     Effort: Pulmonary effort is normal. No respiratory distress.     Breath sounds: Normal breath sounds. No wheezing, rhonchi or rales.  Abdominal:     General: Abdomen is flat. Bowel sounds are normal. There is distension (mild).     Palpations: Abdomen is soft. There is no mass.     Tenderness: There is abdominal tenderness (mild) in the left upper quadrant and left lower quadrant. There is no right CVA tenderness, left CVA tenderness, guarding or rebound. Negative signs include Murphy's sign.     Hernia: No hernia is present.  Neurological:     Mental Status: He is alert.  Psychiatric:        Mood and Affect: Mood normal.        Behavior: Behavior normal.       Results for orders  placed or performed in visit on 08/16/19  POCT Urinalysis Dipstick (Automated)  Result Value Ref Range   Color, UA yellow    Clarity, UA clear    Glucose, UA Negative Negative   Bilirubin, UA negative    Ketones, UA negative    Spec Grav, UA 1.015 1.010 - 1.025   Blood, UA negative    pH, UA 6.0 5.0 - 8.0   Protein, UA Negative Negative   Urobilinogen, UA 0.2 0.2 or 1.0 E.U./dL   Nitrite, UA negative    Leukocytes, UA Negative Negative    Assessment & Plan:  This visit occurred during the SARS-CoV-2 public health emergency.  Safety protocols were in place, including screening questions prior to the visit, additional usage of staff PPE, and extensive cleaning of exam room while observing appropriate contact time as indicated for disinfecting solutions.   Problem List Items Addressed This Visit    Nausea - Primary    1+ mo h/o nausea associated with emesis x1. Discussed possible causes ?gastritis vs liver, gallbladder, etc. Start omeprazole 40mg  daily x 2 wks then PRN. Check labwork, UA today. Low threshold to image given duration of nausea. Pt agrees with plan. No red flags. Significant pepsi intake - recommend back off sweetened beverage and caffeine to help symptoms.       Relevant Orders   Comprehensive metabolic panel   CBC with Differential   Lipase   POCT Urinalysis Dipstick (Automated) (Completed)   HLD (hyperlipidemia)    Reviewed reasons to start statin in CAD hx, goal LDL <70 - he agrees. Will start lipitor 20mg  daily. Monitor for myalgias. The 10-year ASCVD risk score Denman George(Goff DC Montez HagemanJr., et al., 2013) is: 15.2%   Values used to calculate the score:     Age: [age] years     Sex: Male     Is Non-Hispanic African American: No     Diabetic: No     Tobacco smoker: No     Systolic Blood Pressure: 140 mmHg     Is BP treated: No     HDL Cholesterol: 35.4 mg/dL     Total Cholesterol: 158 mg/dL       Relevant Medications   atorvastatin (LIPITOR) 20 MG tablet   Other Relevant  Orders   LDL Cholesterol, Direct       Meds ordered this encounter  Medications  . omeprazole (PRILOSEC) 40 MG capsule    Sig: Take 1 capsule (40 mg total) by mouth daily.    Dispense:  30 capsule    Refill:  1  .  atorvastatin (LIPITOR) 20 MG tablet    Sig: Take 1 tablet (20 mg total) by mouth daily.    Dispense:  90 tablet    Refill:  3   Orders Placed This Encounter  Procedures  . Comprehensive metabolic panel  . CBC with Differential  . Lipase  . LDL Cholesterol, Direct  . POCT Urinalysis Dipstick (Automated)    Patient Instructions  Urine test today, labwork today.  Back off sweetened beverages.  Start omeprazole 40mg  daily for 2 weeks then as needed (acid reducer medicine).    Follow up plan: Return if symptoms worsen or fail to improve.  Ria Bush, MD

## 2019-08-16 NOTE — Assessment & Plan Note (Addendum)
1+ mo h/o nausea associated with emesis x1. Discussed possible causes ?gastritis vs liver, gallbladder, etc. Start omeprazole 40mg  daily x 2 wks then PRN. Check labwork, UA today. Low threshold to image given duration of nausea. Pt agrees with plan. No red flags. Significant pepsi intake - recommend back off sweetened beverage and caffeine to help symptoms.

## 2019-08-16 NOTE — Assessment & Plan Note (Signed)
Reviewed reasons to start statin in CAD hx, goal LDL <70 - he agrees. Will start lipitor 20mg  daily. Monitor for myalgias. The 10-year ASCVD risk score Mikey Bussing DC Brooke Bonito., et al., 2013) is: 15.2%   Values used to calculate the score:     Age: 64 years     Sex: Male     Is Non-Hispanic African American: No     Diabetic: No     Tobacco smoker: No     Systolic Blood Pressure: 007 mmHg     Is BP treated: No     HDL Cholesterol: 35.4 mg/dL     Total Cholesterol: 158 mg/dL

## 2019-08-16 NOTE — Patient Instructions (Addendum)
Urine test today, labwork today.  Back off sweetened beverages.  Start omeprazole 40mg  daily for 2 weeks then as needed (acid reducer medicine).

## 2019-09-23 ENCOUNTER — Encounter: Payer: Self-pay | Admitting: *Deleted

## 2019-09-27 ENCOUNTER — Other Ambulatory Visit: Payer: Self-pay

## 2019-09-27 ENCOUNTER — Encounter: Payer: Self-pay | Admitting: Neurology

## 2019-09-27 ENCOUNTER — Ambulatory Visit: Payer: BC Managed Care – PPO | Admitting: Neurology

## 2019-09-27 VITALS — BP 160/82 | HR 78 | Temp 97.7°F | Ht 65.0 in | Wt 195.0 lb

## 2019-09-27 DIAGNOSIS — H02401 Unspecified ptosis of right eyelid: Secondary | ICD-10-CM | POA: Diagnosis not present

## 2019-09-27 DIAGNOSIS — I676 Nonpyogenic thrombosis of intracranial venous system: Secondary | ICD-10-CM

## 2019-09-27 DIAGNOSIS — H532 Diplopia: Secondary | ICD-10-CM | POA: Diagnosis not present

## 2019-09-27 DIAGNOSIS — H4921 Sixth [abducent] nerve palsy, right eye: Secondary | ICD-10-CM

## 2019-09-27 MED ORDER — ALPRAZOLAM 0.25 MG PO TABS: ORAL_TABLET | ORAL | 0 refills | Status: DC

## 2019-09-27 NOTE — Progress Notes (Signed)
[facility]    Provider:  Dr Lucia Gaskins Requesting Provider: Marveen Reeks MD  Primary Care Provider:  Eustaquio Boyden, MD  CC:  Double Vision  HPI:  Roger [name] is a 64 y.o. male here as requested by Marveen Reeks MD for 6th nerve palsy right eye. PMHx HLD, thyroid disorder, CAD, bells palsy, sciatica, overweight. 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 He sees double but no pain, no redness, no fullness, no headaches, he has a bright light across the eye just before having these episodes, no history of migraines or aura the light is recently just once unclear if associated. This one was definitely acute one minute driving the next minute double vision. No headaches. Closing the right eye makes it go away/improve. He takes aspirin sometimes, not daily. No other focal neurologic deficits, associated symptoms, inciting events or modifiable factors.  Reviewed notes, labs and imaging from outside physicians, which showed: see above ldl 106  Review of  Systems: Patient complains of symptoms per HPI as well as the following symptoms: third time with double vision. Pertinent negatives and positives per HPI. All others negative.   Social History   Socioeconomic History  . Marital status: Married    Spouse name: Not on file  . Number of children: Not on file  . Years of education: Not on file  . Highest education level: Not on file  Occupational History  . Not on file  Tobacco Use  . Smoking status: Former Smoker    Quit date: 08/26/1982    Years since quitting: 37.1  . Smokeless tobacco: Never Used  Substance and Sexual Activity  . Alcohol use: No    Alcohol/week: 0.0 standard drinks  . Drug use: No  . Sexual activity: Not on file  Other Topics Concern  . Not on file  Social History Narrative   Lives with wife, daughter with granddaughter   Edu: some college   Occupation: Therapist, art   Activity: stays active at work, 20,000 steps daily   Diet: poor water, good fruits/vegetables daily   Social Determinants of Health   Financial Resource Strain:   . Difficulty of Paying Living Expenses: Not on file  Food Insecurity:   . Worried About Programme researcher, broadcasting/film/video in the Last Year: Not on file  . Ran Out of Food in the Last Year: Not on file  Transportation Needs:   . Lack of Transportation (Medical): Not on file  . Lack of Transportation (Non-Medical): Not on file  Physical Activity:   . Days of Exercise per Week: Not  on file  . Minutes of Exercise per Session: Not on file  Stress:   . Feeling of Stress : Not on file  Social Connections:   . Frequency of Communication with Friends and Family: Not on file  . Frequency of Social Gatherings with Friends and Family: Not on file  . Attends Religious Services: Not on file  . Active Member of Clubs or Organizations: Not on file  . Attends Archivist Meetings: Not on file  . Marital Status: Not on file  Intimate Partner Violence:   . Fear of Current or  Ex-Partner: Not on file  . Emotionally Abused: Not on file  . Physically Abused: Not on file  . Sexually Abused: Not on file    Family History  Problem Relation Age of Onset  . Cancer Mother 97       colon  . Cancer Father 26       prostate  . CAD Father 74       massive MI  . CAD Paternal Uncle   . Alcoholism Paternal Uncle   . Stroke Paternal Uncle   . Diabetes Other        maternal side    Past Medical History:  Diagnosis Date  . Bell's palsy x2   recurrent  . CAD (coronary artery disease) 2004   with stent, unclear details  . Cataract    OD>OS per notes from [facility]  . History of chicken pox   . History of hypothyroidism as a child  . HLD (hyperlipidemia)     Patient Active Problem List   Diagnosis Date Noted  . 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 Diplopia 09/27/2019  . Nausea 08/16/2019  . Dizziness 01/14/2017  . Health maintenance examination 09/02/2016  . Shoulder pain, bilateral 01/23/2016  . Left-sided low back pain without sciatica 01/23/2016  . Overweight 03/09/2015  . CAD (coronary artery disease)   . HLD (hyperlipidemia)     Past Surgical History:  Procedure Laterality Date  . COLONOSCOPY     normal per patient  . PERCUTANEOUS CORONARY STENT INTERVENTION (PCI-S)  11/08/02   [place]  . TONSILLECTOMY AND ADENOIDECTOMY Bilateral     Current Outpatient Medications  Medication Sig Dispense Refill  . aspirin (ASPIRIN EC) 81 MG EC tablet Take 81 mg by mouth daily. Swallow whole.    Marland Kitchen atorvastatin (LIPITOR) 20 MG tablet Take 1 tablet (20 mg total) by mouth daily. 90 tablet 3  . Multiple Vitamins-Minerals (MULTIVITAMIN ADULT PO) Take 1 tablet by mouth daily.    Marland Kitchen omeprazole (PRILOSEC) 40 MG capsule Take 1 capsule (40 mg total) by mouth daily. 30 capsule 1  . ALPRAZolam (XANAX) 0.25 MG tablet Take 1-2 tabs (0.25mg -0.50mg ) 30-60 minutes before procedure. May repeat if needed.Do not drive. 4 tablet 0   No current facility-administered  medications for this visit.    Allergies as of 09/27/2019  . (No Known Allergies)    Vitals: BP (!) 160/82 (BP Location: Right Arm, Patient Position: Sitting)   Pulse 78   Temp 97.7 F (36.5 C) Comment: taken at front  Ht 5\' 5"  (1.651 m)   Wt 195 lb (88.5 kg)   BMI 32.45 kg/m  Last Weight:  Wt Readings from Last 1 Encounters:  09/27/19 195 lb (88.5 kg)   Last Height:   Ht Readings from Last 1 Encounters:  09/27/19 5\' 5"  (1.651 m)     Physical exam: Exam: Gen: NAD, conversant, obese  CV: RRR, no MRG. No Carotid Bruits. No peripheral edema, warm, nontender Eyes: Conjunctivae clear without exudates or hemorrhage  Neuro: Detailed Neurologic Exam  Speech:    Speech is normal; fluent and spontaneous with normal comprehension.  Cognition:    The patient is oriented to person, place, and time;     recent and remote memory intact;     language fluent;     normal attention, concentration,     fund of knowledge Cranial Nerves:    The pupils are equal, round, and reactive to light.  Attempted funduscopy could not visualize due to ophthalmoplegia and small pupils.  Right 6th nerve palsy. Visual fields are grossly intact difficult to tell with the right eye due to inversion. Trigeminal sensation is intact and the muscles of mastication are normal. Ptosis right eye, otherwise symmetric. The palate elevates in the midline. Hearing intact to voice. Voice is normal. Shoulder shrug is normal. The tongue has normal motion without fasciculations.   Coordination:    Normal finger to nose and heel to shin.  Gait:    Heel-toe and tandem gait are normal.   Motor Observation:    No asymmetry, no atrophy, and no involuntary movements noted. Tone:    Normal muscle tone.    Posture:    Posture is normal. normal erect    Strength:    Strength is V/V in the upper and lower limbs.      Sensation: intact to LT     Reflex Exam:  DTR's:    Deep tendon reflexes in the  upper and lower extremities are brisk bilaterally except right biceps (deferred left AJ) Toes:    The right toe is downgoing, left deferred due to pain in brace.   Clonus:    Clonus is absent.    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, differential is wide and it may include strokes, pontine lesions, inflammatory conditions,  will see if we can get thin cuts through the pons, also the need to rule out aneurysm given slight asymmetry of his lids however this is less likely in a cranial nerve VI palsy, also need to rule out myasthenia gravis, thyroid associated eye disease, tumors, inflammatory cranial nerve etiologies, cavernous sinus disease, may be ischemic palsy due to diabetes or hypertension or cholesterol but still have to go through the entire work-up(and I recommended he does not miss his aspirin every day, take control of vascular risk factors if this indeed is ischemic palsy).   MRI of the brain, orbits and blood vessels; see above Labs today May need a lumbar puncture pending results  MRI brain w/wo contrast - MS protocol, thin cuts through pons and brain stem if possible MRi orbits w.wo contrast  - check for inflammatory condition or other of the cavernous sinus due to repeated episodes of ophthalmoplegia right eye for example, eye pain, ptosis MRA of the head: look for aneurysm, cranial dysfunction and ptosis right eye LP if necessary based on above    Orders Placed This Encounter  Procedures  . MR BRAIN W WO CONTRAST  . MR ORBITS W WO CONTRAST  . MR ANGIO HEAD WO CONTRAST  . Acetylcholine receptor, binding  . Acetylcholine receptor, blocking  . Acetylcholine receptor, modulating  .  CK  . Hemoglobin A1c  . C-reactive protein  . Sedimentation rate  . TSH    Meds ordered this encounter  Medications  . ALPRAZolam (XANAX) 0.25 MG tablet    Sig: Take 1-2 tabs (0.25mg -0.50mg ) 30-60 minutes before procedure. May repeat if needed.Do not drive.    Dispense:  4 tablet    Refill:  0    Cc: Eustaquio Boyden, MD,  Marveen Reeks MD   Naomie Dean, MD  [facility] [address] [place], [place] [address]  Phone [phone] Fax [phone]

## 2019-09-27 NOTE — Patient Instructions (Signed)
MRI of the brain, orbits and blood vessels Labs today May need a lumbar puncture pending results  Alprazolam tablets What is this medicine? ALPRAZOLAM (al PRAY zoe lam) is a benzodiazepine. It is used to treat anxiety and panic attacks. This medicine may be used for other purposes; ask your health care provider or pharmacist if you have questions. COMMON BRAND NAME(S): Xanax What should I tell my health care provider before I take this medicine? They need to know if you have any of these conditions:  an alcohol or drug abuse problem  bipolar disorder, depression, psychosis or other mental health conditions  glaucoma  kidney or liver disease  lung or breathing disease  myasthenia gravis  Parkinson's disease  porphyria  seizures or a history of seizures  suicidal thoughts  an unusual or allergic reaction to alprazolam, other benzodiazepines, foods, dyes, or preservatives  pregnant or trying to get pregnant  breast-feeding How should I use this medicine? Take this medicine by mouth with a glass of water. Follow the directions on the prescription label. Take your medicine at regular intervals. Do not take it more often than directed. Do not stop taking except on your doctor's advice. A special MedGuide will be given to you by the pharmacist with each prescription and refill. Be sure to read this information carefully each time. Talk to your pediatrician regarding the use of this medicine in children. Special care may be needed. Overdosage: If you think you have taken too much of this medicine contact a poison control center or emergency room at once. NOTE: This medicine is only for you. Do not share this medicine with others. What if I miss a dose? If you miss a dose, take it as soon as you can. If it is almost time for your next dose, take only that dose. Do not take double or extra doses. What may interact with this medicine? Do not take this medicine with any of the  following medications:  certain antiviral medicines for HIV or AIDS like delavirdine, indinavir  certain medicines for fungal infections like ketoconazole and itraconazole  narcotic medicines for cough  sodium oxybate This medicine may also interact with the following medications:  alcohol  antihistamines for allergy, cough and cold  certain antibiotics like clarithromycin, erythromycin, isoniazid, rifampin, rifapentine, rifabutin, and troleandomycin  certain medicines for blood pressure, heart disease, irregular heart beat  certain medicines for depression, like amitriptyline, fluoxetine, sertraline  certain medicines for seizures like carbamazepine, oxcarbazepine, phenobarbital, phenytoin, primidone  cimetidine  cyclosporine  male hormones, like estrogens or progestins and birth control pills, patches, rings, or injections  general anesthetics like halothane, isoflurane, methoxyflurane, propofol  grapefruit juice  local anesthetics like lidocaine, pramoxine, tetracaine  medicines that relax muscles for surgery  narcotic medicines for pain  other antiviral medicines for HIV or AIDS  phenothiazines like chlorpromazine, mesoridazine, prochlorperazine, thioridazine This list may not describe all possible interactions. Give your health care provider a list of all the medicines, herbs, non-prescription drugs, or dietary supplements you use. Also tell them if you smoke, drink alcohol, or use illegal drugs. Some items may interact with your medicine. What should I watch for while using this medicine? Tell your doctor or health care professional if your symptoms do not start to get better or if they get worse. Do not stop taking except on your doctor's advice. You may develop a severe reaction. Your doctor will tell you how much medicine to take. You may get drowsy or dizzy. Do  not drive, use machinery, or do anything that needs mental alertness until you know how this  medicine affects you. To reduce the risk of dizzy and fainting spells, do not stand or sit up quickly, especially if you are an older patient. Alcohol may increase dizziness and drowsiness. Avoid alcoholic drinks. If you are taking another medicine that also causes drowsiness, you may have more side effects. Give your health care provider a list of all medicines you use. Your doctor will tell you how much medicine to take. Do not take more medicine than directed. Call emergency for help if you have problems breathing or unusual sleepiness. What side effects may I notice from receiving this medicine? Side effects that you should report to your doctor or health care professional as soon as possible:  allergic reactions like skin rash, itching or hives, swelling of the face, lips, or tongue  breathing problems  confusion  loss of balance or coordination  signs and symptoms of low blood pressure like dizziness; feeling faint or lightheaded, falls; unusually weak or tired  suicidal thoughts or other mood changes Side effects that usually do not require medical attention (report to your doctor or health care professional if they continue or are bothersome):  dizziness  dry mouth  nausea, vomiting  tiredness This list may not describe all possible side effects. Call your doctor for medical advice about side effects. You may report side effects to FDA at 1-800-FDA-1088. Where should I keep my medicine? Keep out of the reach of children. This medicine can be abused. Keep your medicine in a safe place to protect it from theft. Do not share this medicine with anyone. Selling or giving away this medicine is dangerous and against the law. Store at room temperature between 20 and 25 degrees C (68 and 77 degrees F). This medicine may cause accidental overdose and death if taken by other adults, children, or pets. Mix any unused medicine with a substance like cat litter or coffee grounds. Then throw the  medicine away in a sealed container like a sealed bag or a coffee can with a lid. Do not use the medicine after the expiration date. NOTE: This sheet is a summary. It may not cover all possible information. If you have questions about this medicine, talk to your doctor, pharmacist, or health care provider.  2020 Elsevier/Gold Standard (2015-05-11 13:47:25)  Lumbar Puncture A lumbar puncture, also called a spinal tap, is a procedure that is done to remove a small amount of the fluid that surrounds the brain and spinal cord (cerebrospinal fluid, CSF). The fluid is then examined in the lab. This procedure may be done to:  Help diagnose various problems, such as meningitis, encephalitis, multiple sclerosis, and other infections.  Remove fluid and relieve pressure that occurs with certain types of headaches.  Look for bleeding within the brain and spinal cord areas (central nervous system).  Place medicine into the spinal fluid. Tell a health care provider about:  Any allergies you have.  All medicines you are taking, including vitamins, herbs, eye drops, creams, and over-the-counter medicines like aspirin or NSAIDs.  Any problems you or family members have had with anesthetic medicines.  Any blood disorders you have.  Any surgeries you have had.  Any medical conditions you have.  Whether you are pregnant or may be pregnant. What are the risks? Generally, this is a safe procedure. However, problems may occur, including:  Infection at the insertion site that can spread to the bone  or spinal fluid.  Bleeding.  Spinal headache. This is a severe headache that occurs when there is a leak of spinal fluid.  Leakage of CSF.  Allergic reactions to medicines or dyes.  Damage to other structures or organs. What happens before the procedure? Staying hydrated Follow instructions from your health care provider about hydration, which may include:  Up to 2 hours before the procedure - you  may continue to drink clear liquids, such as water, clear fruit juice, black coffee, and plain tea. Eating and drinking restrictions Follow instructions from your health care provider about eating and drinking. If you will be given a medicine to help you relax (sedative), these instructions may include:  8 hours before the procedure - stop eating heavy meals or foods such as meat, fried foods, or fatty foods.  6 hours before the procedure - stop eating light meals or foods, such as toast or cereal.  6 hours before the procedure - stop drinking milk or drinks that contain milk.  2 hours before the procedure - stop drinking clear liquids. Medicines  Ask your health care provider about: ? Changing or stopping your regular medicines. This is especially important if you are taking diabetes medicines or blood thinners. ? Taking medicines such as aspirin and ibuprofen. These medicines can thin your blood. Do not take these medicines before your procedure if your health care provider instructs you not to.  You may be given antibiotic medicine to help prevent infection. If so, take the antibiotic as told by your health care provider. General instructions  You may have a blood sample taken.  You may be asked to shower with a germ-killing soap.  Ask your health care provider how your surgical site will be marked or identified.  Plan to have someone take you home from the hospital or clinic. This is especially important if you will be given a sedative.  If you will be going home right after the procedure, plan to have someone with you for 24 hours. What happens during the procedure?   You may lie down on your side with your knees bent, or you may sit with your head resting on a pillow on your lap. ? How you are positioned depends on your age and size. ? You will be positioned so that the spaces between the bones of the spine (vertebrae) are as wide as possible. This will make it easier to pass  the needle into the spinal canal.  The skin on your lower back (lumbar region) will be cleaned.  You will be given an injection of medicine to numb your lower back area (local anesthetic).  You may be given pain medicine or a sedative.  A small needle will be inserted into your lower back until it enters the space that contains the cerebrospinal fluid. The needle will not enter the spinal cord.  Fluid will be collected into tubes. It will be sent to a lab for examination.  The needle will be withdrawn, and a bandage (dressing) will be placed over the area. The procedure may vary among health care providers and hospitals. What happens after the procedure?  Your blood pressure, heart rate, breathing rate, and blood oxygen level will be monitored until any medicines you were given have worn off.  You may need to stay lying down for a while.  You will need to drink plenty of fluids and caffeine to help prevent a headache.  Do not drive for 24 hours if you received  a sedative. Summary  A lumbar puncture, also called a spinal tap, is a procedure that is done to remove a small amount of the cerebrospinal fluid, CSF. This may be done to help diagnose a wide variety of conditions.  Before the procedure, tell your health care provider about all medicines you are taking, including vitamins, herbs, eye drops, creams, and over-the-counter medicines.  Before the procedure, ask your health care provider about changing or stopping your regular medicines. This is especially important if you are taking blood thinners.  Your lower back will be numbed with an injection before the needle is placed into your spinal canal.  After the procedure, you will lie down for a while and you will drink plenty of fluids. This information is not intended to replace advice given to you by your health care provider. Make sure you discuss any questions you have with your health care provider. Document Revised:  09/25/2016 Document Reviewed: 09/25/2016 Elsevier Patient Education  2020 [employer].

## 2019-10-07 ENCOUNTER — Telehealth: Payer: Self-pay | Admitting: Neurology

## 2019-10-07 NOTE — Telephone Encounter (Signed)
[facility]: [id number] (exp. 10/07/19 to 12/05/19) order sent to [facility].  [facility] is out of network for the patient for MRI's [facility] is in network.

## 2019-10-11 ENCOUNTER — Other Ambulatory Visit: Payer: Self-pay | Admitting: Neurology

## 2019-10-11 DIAGNOSIS — H4921 Sixth [abducent] nerve palsy, right eye: Secondary | ICD-10-CM

## 2019-10-11 DIAGNOSIS — I676 Nonpyogenic thrombosis of intracranial venous system: Secondary | ICD-10-CM

## 2019-10-11 DIAGNOSIS — H532 Diplopia: Secondary | ICD-10-CM

## 2019-10-11 DIAGNOSIS — H02401 Unspecified ptosis of right eyelid: Secondary | ICD-10-CM

## 2019-10-14 LAB — ACETYLCHOLINE RECEPTOR, BINDING: AChR Binding Ab, Serum: 0.03 nmol/L (ref 0.00–0.24)

## 2019-10-14 LAB — SEDIMENTATION RATE: Sed Rate: 28 mm/hr (ref 0–30)

## 2019-10-14 LAB — TSH: TSH: 3.36 u[IU]/mL (ref 0.450–4.500)

## 2019-10-14 LAB — HEMOGLOBIN A1C
Est. average glucose Bld gHb Est-mCnc: 114 mg/dL
Hgb A1c MFr Bld: 5.6 % (ref 4.8–5.6)

## 2019-10-14 LAB — ACETYLCHOLINE RECEPTOR, BLOCKING: Acetylchol Block Ab: 19 % (ref 0–25)

## 2019-10-14 LAB — C-REACTIVE PROTEIN: CRP: 2 mg/L (ref 0–10)

## 2019-10-14 LAB — CK: Total CK: 81 U/L (ref 41–331)

## 2019-10-14 LAB — ACETYLCHOLINE RECEPTOR, MODULATING: Acetylcholine Modulat Ab: <12 % (ref 0–20)

## 2019-10-14 NOTE — Progress Notes (Signed)
Labs normal - thanks!

## 2019-10-25 ENCOUNTER — Ambulatory Visit: Payer: BC Managed Care – PPO | Attending: Internal Medicine

## 2019-10-25 DIAGNOSIS — Z23 Encounter for immunization: Secondary | ICD-10-CM | POA: Insufficient documentation

## 2019-10-25 NOTE — Progress Notes (Signed)
   Covid-19 Vaccination Clinic  Name:  Roger [name]    MRN: [id number] DOB: 1956-04-12  10/25/2019  Roger [name] was observed post Covid-19 immunization for 15 minutes without incidence. He was provided with Vaccine Information Sheet and instruction to access the V-Safe system.   Roger [name] was instructed to call 911 with any severe reactions post vaccine: Marland Kitchen Difficulty breathing  . Swelling of your face and throat  . A fast heartbeat  . A bad rash all over your body  . Dizziness and weakness    Immunizations Administered    Name Date Dose VIS Date Route   Pfizer COVID-19 Vaccine 10/25/2019  9:56 AM 0.3 mL 08/06/2019 Intramuscular   Manufacturer: [employer], [employer]   Lot: [id number]   NDC: [address]-3

## 2019-11-01 ENCOUNTER — Ambulatory Visit
Admission: RE | Admit: 2019-11-01 | Discharge: 2019-11-01 | Disposition: A | Discharge status: Home / Self Care | Payer: BC Managed Care – PPO | Source: Ambulatory Visit | Attending: Neurology | Admitting: Neurology

## 2019-11-01 ENCOUNTER — Other Ambulatory Visit: Payer: Self-pay

## 2019-11-01 DIAGNOSIS — I676 Nonpyogenic thrombosis of intracranial venous system: Secondary | ICD-10-CM

## 2019-11-01 DIAGNOSIS — H532 Diplopia: Secondary | ICD-10-CM | POA: Diagnosis not present

## 2019-11-01 DIAGNOSIS — H02401 Unspecified ptosis of right eyelid: Secondary | ICD-10-CM

## 2019-11-01 DIAGNOSIS — H4921 Sixth [abducent] nerve palsy, right eye: Secondary | ICD-10-CM

## 2019-11-01 MED ORDER — GADOBENATE DIMEGLUMINE 529 MG/ML IV SOLN: 18.0000 mL | Freq: Once | INTRAVENOUS | Status: DC | PRN

## 2019-11-01 MED ORDER — GADOBENATE DIMEGLUMINE 529 MG/ML IV SOLN
18.0000 mL | Freq: Once | INTRAVENOUS | Status: AC | PRN
  Administered 2019-11-01: 18 mL via INTRAVENOUS

## 2019-11-02 ENCOUNTER — Telehealth: Payer: Self-pay | Admitting: Neurology

## 2019-11-02 NOTE — Telephone Encounter (Signed)
Called pt & LVM (ok per DPR) advising his MRI brain, MRA head looked fine per Dr. Lucia Gaskins. Nothing concerning. Advised will discuss further at next office visit on 3/17 @ 2:00 pm. Left office number in message if needed.

## 2019-11-02 NOTE — Telephone Encounter (Signed)
Please let patient know his blood vessels and brain looked fine(MRI brain, MRA of the head), nothing concerning. He has a follow up appointment this month and we can talk about it then, thanks

## 2019-11-03 ENCOUNTER — Other Ambulatory Visit: Payer: Self-pay | Admitting: Neurology

## 2019-11-03 ENCOUNTER — Telehealth: Payer: Self-pay | Admitting: Neurology

## 2019-11-03 DIAGNOSIS — G959 Disease of spinal cord, unspecified: Secondary | ICD-10-CM

## 2019-11-03 DIAGNOSIS — R270 Ataxia, unspecified: Secondary | ICD-10-CM

## 2019-11-03 DIAGNOSIS — G9520 Unspecified cord compression: Secondary | ICD-10-CM

## 2019-11-03 NOTE — Telephone Encounter (Signed)
Spoke with pt and discussed message below from Dr. Lucia Gaskins. Pt's questions were answered. He is agreeable to having MRI c-spine. Pt verbalized appreciation for the call. Keep 3/17 office visit as scheduled.

## 2019-11-03 NOTE — Telephone Encounter (Signed)
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

## 2019-11-03 NOTE — Telephone Encounter (Signed)
Thanks, done.

## 2019-11-04 NOTE — Telephone Encounter (Signed)
Thanks

## 2019-11-10 ENCOUNTER — Telehealth: Payer: Self-pay | Admitting: Neurology

## 2019-11-10 ENCOUNTER — Ambulatory Visit: Payer: Self-pay | Admitting: Neurology

## 2019-11-10 NOTE — Telephone Encounter (Signed)
Per Dr. Lucia Gaskins, she spoke with pt about potentially canceling appt for today and holding until we get the MRI c-spine results so all can be reviewed.  Will proceed with canceling today's appt. MRI auth in progress.   Called pt & LVM (ok per DPR) advising pt that our office is working on the MRI c-spine and he will receive a separate call from GI to schedule. I let him know per Dr. Lucia Gaskins and his conversation, we will go ahead and cancel the appt with Dr. Lucia Gaskins today and will be in touch to reschedule so all results can be discussed. I also left GI's number in the message so pt can call.

## 2019-11-10 NOTE — Telephone Encounter (Signed)
[facility]: [id number] (exp. 11/10/19 to 01/08/20).  [facility] is out of net work for [facility] is in net work. Order sent to [facility]. They will reach out to the patient to schedule.

## 2019-11-23 ENCOUNTER — Ambulatory Visit: Payer: BC Managed Care – PPO | Attending: Internal Medicine

## 2019-11-23 DIAGNOSIS — Z23 Encounter for immunization: Secondary | ICD-10-CM

## 2019-11-23 NOTE — Progress Notes (Signed)
   Covid-19 Vaccination Clinic  Name:  Roger [name]    MRN: [id number] DOB: November 22, 1955  11/23/2019  [name] was observed post Covid-19 immunization for 15 minutes without incident. He was provided with Vaccine Information Sheet and instruction to access the V-Safe system.   [name] was instructed to call 911 with any severe reactions post vaccine: Marland Kitchen Difficulty breathing  . Swelling of face and throat  . A fast heartbeat  . A bad rash all over body  . Dizziness and weakness   Immunizations Administered    Name Date Dose VIS Date Route   Pfizer COVID-19 Vaccine 11/23/2019 10:07 AM 0.3 mL 08/06/2019 Intramuscular   Manufacturer: [employer], [employer]   Lot: [id number]   NDC: [address]-8

## 2019-12-04 ENCOUNTER — Other Ambulatory Visit: Payer: Self-pay | Admitting: Neurology

## 2019-12-04 MED ORDER — ALPRAZOLAM 0.25 MG PO TABS: ORAL_TABLET | ORAL | 0 refills | Status: DC

## 2019-12-06 ENCOUNTER — Ambulatory Visit
Admission: RE | Admit: 2019-12-06 | Discharge: 2019-12-06 | Disposition: A | Discharge status: Home / Self Care | Payer: BC Managed Care – PPO | Source: Ambulatory Visit | Attending: Neurology | Admitting: Neurology

## 2019-12-06 ENCOUNTER — Other Ambulatory Visit: Payer: Self-pay

## 2019-12-06 DIAGNOSIS — R270 Ataxia, unspecified: Secondary | ICD-10-CM

## 2019-12-06 DIAGNOSIS — G9520 Unspecified cord compression: Secondary | ICD-10-CM

## 2019-12-06 DIAGNOSIS — G959 Disease of spinal cord, unspecified: Secondary | ICD-10-CM

## 2019-12-10 ENCOUNTER — Ambulatory Visit
Admission: EM | Admit: 2019-12-10 | Discharge: 2019-12-10 | Disposition: A | Discharge status: Home / Self Care | Payer: BC Managed Care – PPO | Attending: Family Medicine | Admitting: Family Medicine

## 2019-12-10 DIAGNOSIS — J208 Acute bronchitis due to other specified organisms: Secondary | ICD-10-CM | POA: Diagnosis not present

## 2019-12-10 DIAGNOSIS — R059 Cough, unspecified: Secondary | ICD-10-CM

## 2019-12-10 DIAGNOSIS — J3089 Other allergic rhinitis: Secondary | ICD-10-CM

## 2019-12-10 DIAGNOSIS — R05 Cough: Secondary | ICD-10-CM

## 2019-12-10 HISTORY — DX: Diplopia: H53.2

## 2019-12-10 MED ORDER — PREDNISONE 10 MG (21) PO TBPK: ORAL_TABLET | Freq: Every day | ORAL | 0 refills | Status: AC

## 2019-12-10 MED ORDER — ALBUTEROL SULFATE HFA 108 (90 BASE) MCG/ACT IN AERS
2.0000 | INHALATION_SPRAY | Freq: Four times a day (QID) | RESPIRATORY_TRACT | Status: DC | PRN
  Administered 2019-12-10: 10:00:00 2 via RESPIRATORY_TRACT

## 2019-12-10 NOTE — ED Provider Notes (Signed)
Roger [name]    CSN: [id number] Arrival date & time: 12/10/19  4627      History   Chief Complaint Chief Complaint  Patient presents with  . Cough    HPI Roger [name] is a 64 y.o. male.   Patient reports that he has had a runny nose and a "tight cough," for the last 3 days.  Reports that for the last 3 days, his chest is sore when he coughs, sore when he takes a deep breath.  Denies chest pain at the moment, no radiating pain, no diaphoresis no shortness of breath on exertion.  Patient does have history of CAD, high cholesterol, sciatica, double vision.  Patient denies taking any medications to attempt to treat this at home.  Denies daily antihistamine regimen.  Denies headache, nausea, vomiting, diarrhea, rash, fever, other symptoms.  ROS per HPI  The history is provided by the patient.    Past Medical History:  Diagnosis Date  . Bell's palsy x2   recurrent  . CAD (coronary artery disease) 2004   with stent, unclear details  . Cataract    OD>OS per notes from [facility]  . 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   . History of chicken pox   . History of hypothyroidism as a child  . HLD (hyperlipidemia)     Patient Active Problem List   Diagnosis Date Noted  . 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 Diplopia 09/27/2019  . Nausea 08/16/2019  . Dizziness 01/14/2017  . Health maintenance examination 09/02/2016  . Shoulder pain, bilateral 01/23/2016  . Left-sided low back pain without sciatica 01/23/2016  . Overweight 03/09/2015  . CAD (coronary artery disease)   . HLD (hyperlipidemia)     Past Surgical History:  Procedure Laterality Date  . COLONOSCOPY     normal per patient  . PERCUTANEOUS CORONARY STENT INTERVENTION (PCI-S)  11/08/02   [place]  . TONSILLECTOMY AND ADENOIDECTOMY Bilateral        Home Medications    Prior to Admission medications   Medication Sig Start Date End Date Taking? Authorizing Provider  ALPRAZolam  (XANAX) 0.25 MG tablet Take 1-2 tabs (0.25mg -0.50mg ) 30-60 minutes before procedure. May repeat if needed before or during the exam every 30 minutes as needed Do not drive. 12/04/19   Melvenia Beam, MD  aspirin (ASPIRIN EC) 81 MG EC tablet Take 81 mg by mouth daily. Swallow whole.    [provider]  atorvastatin (LIPITOR) 20 MG tablet Take 1 tablet (20 mg total) by mouth daily. 08/16/19   Ria Bush, MD  Multiple Vitamins-Minerals (MULTIVITAMIN ADULT PO) Take 1 tablet by mouth daily.    [provider]  omeprazole (PRILOSEC) 40 MG capsule Take 1 capsule (40 mg total) by mouth daily. 08/16/19   Ria Bush, MD  predniSONE (STERAPRED UNI-PAK 21 TAB) 10 MG (21) TBPK tablet Take by mouth daily for 6 days. Take 6 tablets on day 1, 5 tablets on day 2, 4 tablets on day 3, 3 tablets on day 4, 2 tablets on day 5, 1 tablet on day 6 12/10/19 12/16/19  Faustino Congress, NP    Family History Family History  Problem Relation Age of Onset  . Cancer Mother 70       colon  . Cancer Father 39       prostate  . CAD Father 77       massive MI  . CAD Paternal Uncle   .  Alcoholism Paternal Uncle   . Stroke Paternal Uncle   . Diabetes Other        maternal side    Social History Social History   Tobacco Use  . Smoking status: Former Smoker    Quit date: 08/26/1982    Years since quitting: 37.3  . Smokeless tobacco: Never Used  Substance Use Topics  . Alcohol use: No    Alcohol/week: 0.0 standard drinks  . Drug use: No     Allergies   Patient has no known allergies.   Review of Systems Review of Systems   Physical Exam Triage Vital Signs ED Triage Vitals [12/10/19 0923]  Enc Vitals Group     BP (!) 141/78     Pulse Rate 74     Resp 20     Temp 98 F (36.7 C)     Temp Source Oral     SpO2 97 %     Weight      Height      Head Circumference      Peak Flow      Pain Score      Pain Loc      Pain Edu?      Excl. in GC?    No data  found.  Updated Vital Signs BP (!) 141/78 (BP Location: Right Arm)   Pulse 74   Temp 98 F (36.7 C) (Oral)   Resp 20   SpO2 97%   Visual Acuity Right Eye Distance:   Left Eye Distance:   Bilateral Distance:    Right Eye Near:   Left Eye Near:    Bilateral Near:     Physical Exam Vitals and nursing note reviewed.  Constitutional:      General: He is not in acute distress.    Appearance: Normal appearance. He is well-developed and normal weight. He is not ill-appearing.  HENT:     Head: Normocephalic and atraumatic.     Right Ear: Tympanic membrane normal.     Left Ear: Tympanic membrane normal.     Nose: Congestion and rhinorrhea present.     Mouth/Throat:     Mouth: Mucous membranes are moist.     Pharynx: Oropharynx is clear.  Eyes:     Conjunctiva/sclera: Conjunctivae normal.  Neck:     Vascular: No carotid bruit.  Cardiovascular:     Rate and Rhythm: Normal rate and regular rhythm.     Heart sounds: Normal heart sounds. No murmur.  Pulmonary:     Effort: Pulmonary effort is normal. No respiratory distress.     Breath sounds: No stridor. Wheezing present. No rhonchi or rales.  Chest:     Chest wall: No tenderness.  Abdominal:     Palpations: Abdomen is soft.     Tenderness: There is no abdominal tenderness.  Musculoskeletal:        General: Normal range of motion.     Cervical back: Normal range of motion and neck supple. No rigidity or tenderness.  Lymphadenopathy:     Cervical: No cervical adenopathy.  Skin:    General: Skin is warm and dry.     Capillary Refill: Capillary refill takes less than 2 seconds.  Neurological:     General: No focal deficit present.     Mental Status: He is alert and oriented to person, place, and time.  Psychiatric:        Mood and Affect: Mood normal.        Behavior: Behavior  normal.        Thought Content: Thought content normal.      UC Treatments / Results  Labs (all labs ordered are listed, but only abnormal  results are displayed) Labs Reviewed  NOVEL CORONAVIRUS, NAA    EKG   Radiology No results found.  Procedures Procedures (including critical care time)  Medications Ordered in UC Medications  albuterol (VENTOLIN HFA) 108 (90 Base) MCG/ACT inhaler 2 puff (2 puffs Inhalation Given 12/10/19 0953)    Initial Impression / Assessment and Plan / UC Course  I have reviewed the triage vital signs and the nursing notes.  Pertinent labs & imaging results that were available during my care of the patient were reviewed by me and considered in my medical decision making (see chart for details).     Allergic rhinitis/acute bronchitis: Presents with dry cough, rhinorrhea times last 3 days.  Decreased lung sounds bilaterally in the lower lobes.  Mid and upper lobes of the lungs are CTA bilaterally.  Patient instructed that he may take Zyrtec over-the-counter daily as an antihistamine to help combat his allergies.  Patient prescribed prednisone taper for bronchitis.  Mild wheezes noted on bilateral lung bases.  Patient instructed that if he is not feeling better, to follow-up with this office or with his primary care.  Patient instructed that if he is having trouble swallowing, trouble breathing, he is to report to the ER for further evaluation and treatment.  Patient verbalized understanding, agrees to treatment plan. Final Clinical Impressions(s) / UC Diagnoses   Final diagnoses:  Allergic rhinitis due to other allergic trigger, unspecified seasonality  Acute bronchitis due to other specified organisms  Cough     Discharge Instructions     Your COVID test is pending.  You should self quarantine until the test result is back.    Take Tylenol as needed for fever or discomfort.  Rest and keep yourself hydrated.    Go to the emergency department if you develop shortness of breath, severe diarrhea, high fever not relieved by Tylenol or ibuprofen, or other concerning symptoms.    For treatment  of your allergies at home, I would recommend that you use the albuterol inhaler every 4-6 hours, 2 puffs as needed for shortness of breath, cough, wheezing.  Would have you take Zyrtec over-the-counter for about the next 2 weeks.  I would also have you take Robitussin for your cough if it is bothering you keeping you awake at night.  Follow-up with primary care this office as needed.     ED Prescriptions    Medication Sig Dispense Auth. Provider   predniSONE (STERAPRED UNI-PAK 21 TAB) 10 MG (21) TBPK tablet Take by mouth daily for 6 days. Take 6 tablets on day 1, 5 tablets on day 2, 4 tablets on day 3, 3 tablets on day 4, 2 tablets on day 5, 1 tablet on day 6 21 tablet Moshe Cipro, NP     PDMP not reviewed this encounter.   Moshe Cipro, NP 12/10/19 1248

## 2019-12-10 NOTE — ED Notes (Signed)
Pt presents with nasal congestion, general fatigue and body aches x 2 days.  Cough started yesterday.  States he feels like he has bronchitis.  Reports heaviness in his chest and burning in chest when he coughs. No known exposure to illness.

## 2019-12-10 NOTE — Discharge Instructions (Addendum)
Your COVID test is pending.  You should self quarantine until the test result is back.    Take Tylenol as needed for fever or discomfort.  Rest and keep yourself hydrated.    Go to the emergency department if you develop shortness of breath, severe diarrhea, high fever not relieved by Tylenol or ibuprofen, or other concerning symptoms.    For treatment of your allergies at home, I would recommend that you use the albuterol inhaler every 4-6 hours, 2 puffs as needed for shortness of breath, cough, wheezing.  Would have you take Zyrtec over-the-counter for about the next 2 weeks.  I would also have you take Robitussin for your cough if it is bothering you keeping you awake at night.  Follow-up with primary care this office as needed.

## 2019-12-11 LAB — NOVEL CORONAVIRUS, NAA: SARS-CoV-2, NAA: NOT DETECTED

## 2019-12-11 LAB — SARS-COV-2, NAA 2 DAY TAT

## 2020-01-30 ENCOUNTER — Other Ambulatory Visit: Payer: Self-pay

## 2020-01-30 ENCOUNTER — Encounter: Payer: Self-pay | Admitting: *Deleted

## 2020-01-30 ENCOUNTER — Ambulatory Visit
Admission: EM | Admit: 2020-01-30 | Discharge: 2020-01-30 | Disposition: A | Discharge status: Home / Self Care | Payer: BC Managed Care – PPO | Attending: Emergency Medicine | Admitting: Emergency Medicine

## 2020-01-30 DIAGNOSIS — R5383 Other fatigue: Secondary | ICD-10-CM

## 2020-01-30 DIAGNOSIS — W57XXXA Bitten or stung by nonvenomous insect and other nonvenomous arthropods, initial encounter: Secondary | ICD-10-CM | POA: Diagnosis not present

## 2020-01-30 HISTORY — DX: Disorder of thyroid, unspecified: E07.9

## 2020-01-30 MED ORDER — DOXYCYCLINE HYCLATE 100 MG PO CAPS: 100.0000 mg | ORAL_CAPSULE | Freq: Two times a day (BID) | ORAL | 0 refills | Status: AC

## 2020-01-30 NOTE — ED Provider Notes (Signed)
[facility]    CSN: [id number] Arrival date & time: 01/30/20  1937      History   Chief Complaint Chief Complaint  Patient presents with  . Tick Bite    HPI Roger [name] is a 64 y.o. male with history of Bell's palsy, CAD presenting for presenting for tick bite to anterior aspect of left scrotum.  States he noticed this Wednesday.  He and his wife attempted to remove the tick, though were unable to remove the head.  Noticing lump to ipsilateral inner thigh.  Does endorse fatigue.  No headaches, myalgias, arthralgias, fever.   Past Medical History:  Diagnosis Date  . Bell's palsy x2   recurrent  . CAD (coronary artery disease) 2004   with stent, unclear details  . Cataract    OD>OS per notes from [facility]  . 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   . History of chicken pox   . History of hypothyroidism as a child  . HLD (hyperlipidemia)   . Thyroid disease     Patient Active Problem List   Diagnosis Date Noted  . 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 Diplopia 09/27/2019  . Nausea 08/16/2019  . Dizziness 01/14/2017  . Health maintenance examination 09/02/2016  . Shoulder pain, bilateral 01/23/2016  . Left-sided low back pain without sciatica 01/23/2016  . Overweight 03/09/2015  . CAD (coronary artery disease)   . HLD (hyperlipidemia)     Past Surgical History:  Procedure Laterality Date  . COLONOSCOPY     normal per patient  . PERCUTANEOUS CORONARY STENT INTERVENTION (PCI-S)  11/08/02   [place]  . TONSILLECTOMY    . TONSILLECTOMY AND ADENOIDECTOMY Bilateral        Home Medications    Prior to Admission medications   Medication Sig Start Date End Date Taking? Authorizing Provider  ALPRAZolam (XANAX) 0.25 MG tablet Take 1-2 tabs (0.25mg -0.50mg ) 30-60 minutes before procedure. May repeat if needed before or during the exam every 30 minutes as needed Do not drive. 12/04/19  Yes Anson Fret, MD  aspirin (ASPIRIN EC)  81 MG EC tablet Take 81 mg by mouth daily. Swallow whole.   Yes [provider]  atorvastatin (LIPITOR) 20 MG tablet Take 1 tablet (20 mg total) by mouth daily. 08/16/19  Yes Eustaquio Boyden, MD  Multiple Vitamins-Minerals (MULTIVITAMIN ADULT PO) Take 1 tablet by mouth daily.   Yes [provider]  omeprazole (PRILOSEC) 40 MG capsule Take 1 capsule (40 mg total) by mouth daily. 08/16/19  Yes Eustaquio Boyden, MD  doxycycline (VIBRAMYCIN) 100 MG capsule Take 1 capsule (100 mg total) by mouth 2 (two) times daily for 14 days. 01/30/20 02/13/20  Hall-Potvin, [place], PA-C    Family History Family History  Problem Relation Age of Onset  . Cancer Mother 60       colon  . Cancer Father 19       prostate  . CAD Father 17       massive MI  . CAD Paternal Uncle   . Alcoholism Paternal Uncle   . Stroke Paternal Uncle   . Diabetes Other        maternal side    Social History Social History   Tobacco Use  . Smoking status: Former Smoker    Quit date: 08/26/1982    Years since quitting: 37.4  . Smokeless tobacco: Never Used  Substance Use Topics  . Alcohol use: No  Alcohol/week: 0.0 standard drinks  . Drug use: No     Allergies   Patient has no known allergies.   Review of Systems As per HPI   Physical Exam Triage Vital Signs ED Triage Vitals  Enc Vitals Group     BP      Pulse      Resp      Temp      Temp src      SpO2      Weight      Height      Head Circumference      Peak Flow      Pain Score      Pain Loc      Pain Edu?      Excl. in [place]?    No data found.  Updated Vital Signs BP (!) 177/80 (BP Location: Left Arm)   Pulse 65   Temp (!) 97.4 F (36.3 C) (Oral)   Resp 18   SpO2 97%   Visual Acuity Right Eye Distance:   Left Eye Distance:   Bilateral Distance:    Right Eye Near:   Left Eye Near:    Bilateral Near:     Physical Exam Constitutional:      General: He is not in acute distress. HENT:     Head: Normocephalic  and atraumatic.  Eyes:     General: No scleral icterus.    Pupils: Pupils are equal, round, and reactive to light.  Cardiovascular:     Rate and Rhythm: Normal rate.  Pulmonary:     Effort: Pulmonary effort is normal. No respiratory distress.     Breath sounds: No wheezing.  Musculoskeletal:     Comments: Right proximal medial thigh with resolving bite mark.  No LAD.  Skin:    Coloration: Skin is not jaundiced or pale.     Comments: Head of tick noted over anterior aspect of left proximal scrotum.  Successfully removed without bleeding.  Neurological:     Mental Status: He is alert and oriented to person, place, and time.      UC Treatments / Results  Labs (all labs ordered are listed, but only abnormal results are displayed) [place] MTN SPOTTED FVR ABS PNL(IGG+IGM)    EKG   Radiology No results found.  Procedures Procedures (including critical care time)  Medications Ordered in UC Medications - No data to display  Initial Impression / Assessment and Plan / UC Course  I have reviewed the triage vital signs and the nursing notes.  Pertinent labs & imaging results that were available during my care of the patient were reviewed by me and considered in my medical decision making (see chart for details).     Patient afebrile, nontoxic in office today.  Patient does endorse fatigue.  Tick had has been present greater than 3 days.  Will start doxycycline, blood work pending: Patient will follow up with PCP in 2 weeks for repeat evaluation.  Head of tick successfully removed in office without complication.  Return precautions discussed, patient verbalized understanding and is agreeable to plan. Final Clinical Impressions(s) / UC Diagnoses   Final diagnoses:  Tick bite, initial encounter  Other fatigue     Discharge Instructions     Antibiotic with food twice daily. You are given 2 weeks worth of antibiotics: Important to  follow-up with your PCP by this time for repeat evaluation. Return sooner for worsening fatigue, headaches, muscle aches,  joint pain, fever.    ED Prescriptions    Medication Sig Dispense Auth. Provider   doxycycline (VIBRAMYCIN) 100 MG capsule Take 1 capsule (100 mg total) by mouth 2 (two) times daily for 14 days. 28 capsule Hall-Potvin, [place], PA-C     PDMP not reviewed this encounter.   Hall-Potvin, [place], [place] 01/30/20 [phone]

## 2020-01-30 NOTE — ED Triage Notes (Signed)
Pt reports tick bite to scrotum - noticed 3 days ago.  States attempted to remove tick, but unsure if portion remains.  Also noticed "lump" to groin.  Denies fevers or pain.

## 2020-01-30 NOTE — Discharge Instructions (Signed)
Antibiotic with food twice daily. You are given 2 weeks worth of antibiotics: Important to follow-up with your PCP by this time for repeat evaluation. Return sooner for worsening fatigue, headaches, muscle aches, joint pain, fever.

## 2020-02-01 LAB — ROCKY MTN SPOTTED FVR ABS PNL(IGG+IGM)
RMSF IgG: NEGATIVE
RMSF IgM: 1.09 index — ABNORMAL HIGH (ref 0.00–0.89)

## 2020-02-01 LAB — B. BURGDORFI ANTIBODIES: Lyme IgG/IgM Ab: <0.91 {ISR} (ref 0.00–0.90)

## 2020-02-11 ENCOUNTER — Ambulatory Visit (INDEPENDENT_AMBULATORY_CARE_PROVIDER_SITE_OTHER): Payer: BC Managed Care – PPO | Admitting: Family Medicine

## 2020-02-11 ENCOUNTER — Encounter: Payer: Self-pay | Admitting: Family Medicine

## 2020-02-11 ENCOUNTER — Other Ambulatory Visit: Payer: Self-pay

## 2020-02-11 VITALS — BP 152/82 | HR 77 | Ht 65.0 in | Wt 176.0 lb

## 2020-02-11 DIAGNOSIS — E785 Hyperlipidemia, unspecified: Secondary | ICD-10-CM

## 2020-02-11 DIAGNOSIS — B882 Other arthropod infestations: Secondary | ICD-10-CM | POA: Insufficient documentation

## 2020-02-11 DIAGNOSIS — E663 Overweight: Secondary | ICD-10-CM

## 2020-02-11 DIAGNOSIS — R030 Elevated blood-pressure reading, without diagnosis of hypertension: Secondary | ICD-10-CM | POA: Diagnosis not present

## 2020-02-11 MED ORDER — AMLODIPINE BESYLATE 5 MG PO TABS: 5.0000 mg | ORAL_TABLET | Freq: Every day | ORAL | 6 refills | Status: DC

## 2020-02-11 NOTE — Assessment & Plan Note (Signed)
RMSF IgM returned positive. Patient completing 14d doxy course, has tolerated well and feels well. I did ask him to return next week for rpt RMSF titers to confirm positive case.

## 2020-02-11 NOTE — Progress Notes (Signed)
This visit was conducted in person.  BP (!) 152/82   Pulse 77   Ht 5\' 5"  (1.651 m)   Wt 176 lb (79.8 kg)   SpO2 96%   BMI 29.29 kg/m   BP Readings from Last 3 Encounters:  02/11/20 (!) 152/82  01/30/20 (!) 177/80  12/10/19 (!) 141/78  Elevated on repeat 156/90  CC: f/u [facility] tick  bite Subjective:    Patient ID: 12/12/[id number], male    DOB: 1956/06/24, 64 y.o.   MRN: [id number]  HPI: Laakea Pereira is a 64 y.o. male presenting on 02/11/2020 for Follow-up (Tick bite)   Seen at ER 2 wks ago for L scrotal tick bite present for 3 days - treated with 2 wk doxycycline course to cover for tick borne illness given endorsed fatigue, head of tick removed at [facility]. Continues taking doxycycline, tolerating well. Got tick while mowing grass. Only symptom was fatigue. Never rash, fever, abd pain, new joint pains.   Labs at [facility]: RMSF IgM was positive  Lyme testing was negative   Recent 6th CN palsy found by Dr AVITA ONTARIO OD s/p reassuring neurological eval by Dr Zadie Rhine including MRI with thin cuts through pons, MRI orbits, MRA head (slight enlargement of extraocular muscles ?thyroid disease related).   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   Has home cuff but hasn't been checking.      Relevant past medical, surgical, family and social history reviewed and updated as indicated. Interim medical history since our last visit reviewed. Allergies and medications reviewed and updated. Outpatient Medications Prior to Visit  Medication Sig Dispense Refill  . ALPRAZolam (XANAX) 0.25 MG tablet Take 1-2 tabs (0.25mg -0.50mg ) 30-60 minutes before procedure. May repeat if needed before or during the exam every 30 minutes as needed Do not drive. 6 tablet 0  . aspirin (ASPIRIN EC) 81 MG EC tablet Take 81 mg by mouth daily. Swallow whole.    Lucia Gaskins atorvastatin (LIPITOR) 20 MG tablet Take 1 tablet (20 mg total) by mouth daily. 90 tablet 3  .  doxycycline (VIBRAMYCIN) 100 MG capsule Take 1 capsule (100 mg total) by mouth 2 (two) times daily for 14 days. 28 capsule 0  . Multiple Vitamins-Minerals (MULTIVITAMIN ADULT PO) Take 1 tablet by mouth daily.    Marland Kitchen omeprazole (PRILOSEC) 40 MG capsule Take 1 capsule (40 mg total) by mouth daily. 30 capsule 1   No facility-administered medications prior to visit.     Per HPI unless specifically indicated in ROS section below Review of Systems Objective:  BP (!) 152/82   Pulse 77   Ht 5\' 5"  (1.651 m)   Wt 176 lb (79.8 kg)   SpO2 96%   BMI 29.29 kg/m   Wt Readings from Last 3 Encounters:  02/11/20 176 lb (79.8 kg)  09/27/19 195 lb (88.5 kg)  08/16/19 187 lb 3 oz (84.9 kg)      Physical Exam Vitals and nursing note reviewed.  Constitutional:      Appearance: Normal appearance. He is not ill-appearing.  Cardiovascular:     Rate and Rhythm: Normal rate and regular rhythm.     Pulses: Normal pulses.     Heart sounds: Normal heart sounds. No murmur heard.   Pulmonary:     Effort: Pulmonary effort is normal. No respiratory distress.     Breath sounds: Normal breath sounds. No wheezing, rhonchi or rales.  Abdominal:     General: Abdomen is flat.  Bowel sounds are normal. There is no distension.     Palpations: Abdomen is soft. There is no mass.     Tenderness: There is no abdominal tenderness. There is no right CVA tenderness, left CVA tenderness, guarding or rebound.     Hernia: No hernia is present.  Musculoskeletal:     Right lower leg: No edema.     Left lower leg: No edema.  Skin:    Findings: No rash.  Neurological:     Mental Status: He is alert.  Psychiatric:        Mood and Affect: Mood normal.        Behavior: Behavior normal.       Results for orders placed or performed during the hospital encounter of 01/30/20  B. burgdorfi antibodies  Result Value Ref Range   Lyme IgG/IgM Ab <0.91 0.00 - 0.90 ISR  Rocky mtn spotted fvr abs pnl(IgG+IgM)  Result Value Ref Range    RMSF IgG Negative Negative   RMSF IgM 1.09 (H) 0.00 - 0.89 index   Assessment & Plan:  This visit occurred during the SARS-CoV-2 public health emergency.  Safety protocols were in place, including screening questions prior to the visit, additional usage of staff PPE, and extensive cleaning of exam room while observing appropriate contact time as indicated for disinfecting solutions.   Problem List Items Addressed This Visit    Tick-borne disease - Primary    RMSF IgM returned positive. Patient completing 14d doxy course, has tolerated well and feels well. I did ask him to return next week for rpt RMSF titers to confirm positive case.       Relevant Orders   Rocky mtn spotted fvr abs pnl(IgG+IgM)   Overweight    Congratulated on weight loss to date - he attributes to regular exercise at work walking [employer] - he states he is planning to retire soon and wants to maintain current level of activity.       HLD (hyperlipidemia)    Not regular with statin.  Will need FLP next fasting labs.  H/o CAD.       Relevant Medications   amLODipine (NORVASC) 5 MG tablet   Elevated blood pressure reading without diagnosis of hypertension    Elevated last few office visits.  WASP for amlodipine 5mg  printed for patient with indications when to fill - ie home BP readings consistently >140/90.           Meds ordered this encounter  Medications  . amLODipine (NORVASC) 5 MG tablet    Sig: Take 1 tablet (5 mg total) by mouth daily.    Dispense:  30 tablet    Refill:  6   Orders Placed This Encounter  Procedures  . Rocky mtn spotted fvr abs pnl(IgG+IgM)    Standing Status:   Future    Standing Expiration Date:   02/10/2021    Patient Instructions  Blood pressures are staying high today. Start monitoring at home - if consistently >140/90, start new blood pressure medicine printed out today.   Schedule lab visit in 1 week for retesting for [place] spotted fever - your initial test was  positive - this will be confirmatory testing.   Finish antibiotics for full 14 days.   Schedule physical at your convenience (last done 2018).    Follow up plan: Return if symptoms worsen or fail to improve.  Ria Bush, MD

## 2020-02-11 NOTE — Patient Instructions (Addendum)
Blood pressures are staying high today. Start monitoring at home - if consistently >140/90, start new blood pressure medicine printed out today.   Schedule lab visit in 1 week for retesting for rocky mountain spotted fever - your initial test was positive - this will be confirmatory testing.   Finish antibiotics for full 14 days.   Schedule physical at your convenience (last done 2018).

## 2020-02-11 NOTE — Assessment & Plan Note (Signed)
Elevated last few office visits.  WASP for amlodipine 5mg  printed for patient with indications when to fill - ie home BP readings consistently >140/90.

## 2020-02-11 NOTE — Assessment & Plan Note (Signed)
Not regular with statin.  Will need FLP next fasting labs.  H/o CAD.

## 2020-02-11 NOTE — Assessment & Plan Note (Signed)
Congratulated on weight loss to date - he attributes to regular exercise at work walking [employer] - he states he is planning to retire soon and wants to maintain current level of activity.

## 2020-02-15 ENCOUNTER — Other Ambulatory Visit (INDEPENDENT_AMBULATORY_CARE_PROVIDER_SITE_OTHER): Payer: BC Managed Care – PPO

## 2020-02-15 DIAGNOSIS — B882 Other arthropod infestations: Secondary | ICD-10-CM

## 2020-02-15 NOTE — Addendum Note (Signed)
Addended by: Alvina Chou on: 02/15/2020 04:40 PM   Modules accepted: Orders

## 2020-02-16 LAB — ROCKY MTN SPOTTED FVR ABS PNL(IGG+IGM)
RMSF IgG: NOT DETECTED
RMSF IgM: NOT DETECTED

## 2020-02-22 ENCOUNTER — Other Ambulatory Visit: Payer: Self-pay

## 2020-02-22 ENCOUNTER — Encounter: Payer: Self-pay | Admitting: Family Medicine

## 2020-02-22 ENCOUNTER — Ambulatory Visit (INDEPENDENT_AMBULATORY_CARE_PROVIDER_SITE_OTHER): Payer: BC Managed Care – PPO | Admitting: Family Medicine

## 2020-02-22 VITALS — BP 136/78 | HR 75 | Temp 98.3°F | Ht 63.0 in | Wt 171.3 lb

## 2020-02-22 DIAGNOSIS — G4762 Sleep related leg cramps: Secondary | ICD-10-CM | POA: Diagnosis not present

## 2020-02-22 DIAGNOSIS — H4921 Sixth [abducent] nerve palsy, right eye: Secondary | ICD-10-CM

## 2020-02-22 DIAGNOSIS — R030 Elevated blood-pressure reading, without diagnosis of hypertension: Secondary | ICD-10-CM

## 2020-02-22 DIAGNOSIS — Z23 Encounter for immunization: Secondary | ICD-10-CM

## 2020-02-22 DIAGNOSIS — E785 Hyperlipidemia, unspecified: Secondary | ICD-10-CM | POA: Diagnosis not present

## 2020-02-22 DIAGNOSIS — Z125 Encounter for screening for malignant neoplasm of prostate: Secondary | ICD-10-CM

## 2020-02-22 DIAGNOSIS — Z1211 Encounter for screening for malignant neoplasm of colon: Secondary | ICD-10-CM | POA: Diagnosis not present

## 2020-02-22 DIAGNOSIS — I2510 Atherosclerotic heart disease of native coronary artery without angina pectoris: Secondary | ICD-10-CM

## 2020-02-22 DIAGNOSIS — E669 Obesity, unspecified: Secondary | ICD-10-CM

## 2020-02-22 DIAGNOSIS — Z0000 Encounter for general adult medical examination without abnormal findings: Secondary | ICD-10-CM

## 2020-02-22 NOTE — Assessment & Plan Note (Signed)
Can get pretty severe - discussed conservative measures of good hydration status, leg stretching, avoiding tight bedding at foot of bed.

## 2020-02-22 NOTE — Assessment & Plan Note (Signed)
BP better controlled today - he states home readings comparable. Will stay off antihypertensive at this time.

## 2020-02-22 NOTE — Assessment & Plan Note (Signed)
Chronic, stable - continue lipitor 20mg  daily. Update FLP.  The ASCVD Risk score DC Jr., et al., 2013) failed to calculate for the following reasons:   Cannot find a previous HDL lab   Cannot find a previous total cholesterol lab

## 2020-02-22 NOTE — Progress Notes (Addendum)
This visit was conducted in person.  BP 136/78 (BP Location: Left Arm, Patient Position: Sitting, Cuff Size: Normal)   Pulse 75   Temp 98.3 F (36.8 C) (Temporal)   Ht 5\' 3"  (1.6 m)   Wt 171 lb 5 oz (77.7 kg)   SpO2 96%   BMI 30.35 kg/m    CC: CPE Subjective:    Patient ID: , male    DOB: Mar 19, 1956, 64 y.o.   MRN: [id number]  HPI: Roger [name] is a 64 y.o. male presenting on 02/22/2020 for Annual Exam   Fasting today for labs Home BP 130/70s.  Rare PPI use.  Coughs when he eats solid foods. No trouble with liquids. No early satiety. No dysphagia.   Preventative: COLONOSCOPY normal per patient in 02/24/2020 but unsure where or when. Will refer for rpt colonoscopy. Denies blood in stool or bowel changes.  Prostate cancer screen - will continue screening. No nocturia. Some stream weakening noted.  Lung cancer screening - not eligible  Flu - declines  COVID vaccine - completed Pfizer 10/2019 Tdap today. Pneumovax - will defer  shingrix - to consider  Advanced directive - discussed. Has not set this up but would want wife and daughter to be HCPOA.  Seat belt use discussed Sunscreen use discussed. No changing moles on skin.  Ex smoker - quit remotely Alcohol - none Dentist due Eye exam yearly  Lives with wife, daughter with granddaughter Edu: some college Occupation: 11/2019 Activity: stays active at work, 20,000 steps daily  Diet: poor water, good fruits/vegetables daily      Relevant past medical, surgical, family and social history reviewed and updated as indicated. Interim medical history since our last visit reviewed. Allergies and medications reviewed and updated. Outpatient Medications Prior to Visit  Medication Sig Dispense Refill  . ALPRAZolam (XANAX) 0.25 MG tablet Take 1-2 tabs (0.25mg -0.50mg ) 30-60 minutes before procedure. May repeat if needed before or during the exam every 30 minutes as needed Do not drive. 6 tablet 0    . aspirin (ASPIRIN EC) 81 MG EC tablet Take 81 mg by mouth daily. Swallow whole.    Therapist, art atorvastatin (LIPITOR) 20 MG tablet Take 1 tablet (20 mg total) by mouth daily. 90 tablet 3  . Multiple Vitamins-Minerals (MULTIVITAMIN ADULT PO) Take 1 tablet by mouth daily.    Marland Kitchen omeprazole (PRILOSEC) 40 MG capsule Take 1 capsule (40 mg total) by mouth daily. 30 capsule 1  . amLODipine (NORVASC) 5 MG tablet Take 1 tablet (5 mg total) by mouth daily. (Patient not taking: Reported on 02/22/2020) 30 tablet 6   No facility-administered medications prior to visit.     Per HPI unless specifically indicated in ROS section below Review of Systems  Constitutional: Negative for activity change, appetite change, chills, fatigue, fever and unexpected weight change.  HENT: Negative for hearing loss.   Eyes: Negative for visual disturbance.  Respiratory: Positive for cough. Negative for chest tightness, shortness of breath and wheezing.   Cardiovascular: Negative for chest pain, palpitations and leg swelling.  Gastrointestinal: Negative for abdominal distention, abdominal pain, blood in stool, constipation, diarrhea, nausea and vomiting.  Genitourinary: Negative for difficulty urinating and hematuria.  Musculoskeletal: Negative for arthralgias, myalgias and neck pain.       Occasional severe muscle cramps of left calf worse at night time  Skin: Negative for rash.  Neurological: Negative for dizziness, seizures, syncope and headaches.  Hematological: Negative for adenopathy. Does not bruise/bleed easily.  Psychiatric/Behavioral: Negative for  dysphoric mood. The patient is not nervous/anxious.    Objective:  BP 136/78 (BP Location: Left Arm, Patient Position: Sitting, Cuff Size: Normal)   Pulse 75   Temp 98.3 F (36.8 C) (Temporal)   Ht 5\' 3"  (1.6 m)   Wt 171 lb 5 oz (77.7 kg)   SpO2 96%   BMI 30.35 kg/m   Wt Readings from Last 3 Encounters:  02/22/20 171 lb 5 oz (77.7 kg)  02/11/20 176 lb (79.8 kg)   09/27/19 195 lb (88.5 kg)      Physical Exam Vitals and nursing note reviewed.  Constitutional:      General: He is not in acute distress.    Appearance: Normal appearance. He is well-developed. He is not ill-appearing.  HENT:     Head: Normocephalic and atraumatic.     Right Ear: Hearing, tympanic membrane, ear canal and external ear normal.     Left Ear: Hearing, tympanic membrane, ear canal and external ear normal.  Eyes:     General: No scleral icterus.    Extraocular Movements: Extraocular movements intact.     Conjunctiva/sclera: Conjunctivae normal.     Pupils: Pupils are equal, round, and reactive to light.  Cardiovascular:     Rate and Rhythm: Normal rate and regular rhythm.     Pulses: Normal pulses.          Radial pulses are 2+ on the right side and 2+ on the left side.     Heart sounds: Normal heart sounds. No murmur heard.   Pulmonary:     Effort: Pulmonary effort is normal. No respiratory distress.     Breath sounds: Normal breath sounds. No wheezing, rhonchi or rales.  Abdominal:     General: Abdomen is flat. Bowel sounds are normal. There is no distension.     Palpations: Abdomen is soft. There is no mass.     Tenderness: There is no abdominal tenderness. There is no guarding or rebound.     Hernia: No hernia is present.  Genitourinary:    Prostate: Enlarged (30gm). Not tender and no nodules present.     Rectum: Normal. No mass, tenderness, anal fissure, external hemorrhoid or internal hemorrhoid. Normal anal tone.  Musculoskeletal:        General: Normal range of motion.     Cervical back: Normal range of motion and neck supple.     Right lower leg: No edema.     Left lower leg: No edema.  Lymphadenopathy:     Cervical: No cervical adenopathy.  Skin:    General: Skin is warm and dry.     Findings: No rash.  Neurological:     General: No focal deficit present.     Mental Status: He is alert and oriented to person, place, and time.     Comments: CN  grossly intact, station and gait intact  Psychiatric:        Mood and Affect: Mood normal.        Behavior: Behavior normal.        Thought Content: Thought content normal.        Judgment: Judgment normal.       Results for orders placed or performed in visit on 02/15/20  Rocky mtn spotted fvr abs pnl(IgG+IgM)  Result Value Ref Range   RMSF IgG NOT DETECTED NOT DETECT   RMSF IgM NOT DETECTED NOT DETECT   Lab Results  Component Value Date   TSH 3.360 09/27/2019    Assessment &  Plan:  This visit occurred during the SARS-CoV-2 public health emergency.  Safety protocols were in place, including screening questions prior to the visit, additional usage of staff PPE, and extensive cleaning of exam room while observing appropriate contact time as indicated for disinfecting solutions.   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       Nocturnal leg cramps    Can get pretty severe - discussed conservative measures of good hydration status, leg stretching, avoiding tight bedding at foot of bed.       Relevant Orders   Magnesium   HLD (hyperlipidemia)    Chronic, stable - continue lipitor 20mg  daily. Update FLP.  The ASCVD Risk score DC Jr., et al., 2013) failed to calculate for the following reasons:   Cannot find a previous HDL lab   Cannot find a previous total cholesterol lab       Relevant Orders   Lipid panel   Comprehensive metabolic panel   TSH   Health maintenance examination - Primary    Preventative protocols reviewed and updated unless pt declined. Discussed healthy diet and lifestyle.       Elevated blood pressure reading without diagnosis of hypertension    BP better controlled today - he states home readings comparable. Will stay off antihypertensive at this  time.       Cranial nerve VI palsy, right   CAD (coronary artery disease)    H/o this s/p stent. I don't have details. Continue aspirin and statin.        Other Visit Diagnoses    Special screening for malignant neoplasms, colon       Relevant Orders   Ambulatory referral to Gastroenterology   Special screening for malignant neoplasm of prostate       Relevant Orders   PSA   Need for Tdap vaccination       Relevant Orders   Tdap vaccine greater than or equal to 7yo IM (Completed)       No orders of the defined types were placed in this encounter.  Orders Placed This Encounter  Procedures  . Tdap vaccine greater than or equal to 7yo IM  . Lipid panel  . Comprehensive metabolic panel  . TSH  . PSA  . Magnesium  . Ambulatory referral to Gastroenterology    Referral Priority:   Routine    Referral Type:   Consultation    Referral Reason:   Specialty Services Required    Number of Visits Requested:   1    Patient instructions: Tdap today Labs today We will refer you for colonoscopy.  Consider pneumonia shot (pneumovax) and shingles shots (shingrix) in the future. Could come in for nurse visit or we can discuss at next appointment.  Good to see you today Return as needed or in 1 year for next physical.   Follow up plan: Return in about 1 year (around 02/21/2021) for annual exam, prior fasting for blood work.  02/23/2021, MD

## 2020-02-22 NOTE — Patient Instructions (Addendum)
Tdap today Labs today We will refer you for colonoscopy.  Consider pneumonia shot (pneumovax) and shingles shots (shingrix) in the future. Could come in for nurse visit or we can discuss at next appointment.  Good to see you today Return as needed or in 1 year for next physical.   Health Maintenance After Age 66 After age 76, you are at a higher risk for certain long-term diseases and infections as well as injuries from falls. Falls are a major cause of broken bones and head injuries in people who are older than age 5. Getting regular preventive care can help to keep you healthy and well. Preventive care includes getting regular testing and making lifestyle changes as recommended by your health care provider. Talk with your health care provider about:  Which screenings and tests you should have. A screening is a test that checks for a disease when you have no symptoms.  A diet and exercise plan that is right for you. What should I know about screenings and tests to prevent falls? Screening and testing are the best ways to find a health problem early. Early diagnosis and treatment give you the best chance of managing medical conditions that are common after age 47. Certain conditions and lifestyle choices may make you more likely to have a fall. Your health care provider may recommend:  Regular vision checks. Poor vision and conditions such as cataracts can make you more likely to have a fall. If you wear glasses, make sure to get your prescription updated if your vision changes.  Medicine review. Work with your health care provider to regularly review all of the medicines you are taking, including over-the-counter medicines. Ask your health care provider about any side effects that may make you more likely to have a fall. Tell your health care provider if any medicines that you take make you feel dizzy or sleepy.  Osteoporosis screening. Osteoporosis is a condition that causes the bones to get  weaker. This can make the bones weak and cause them to break more easily.  Blood pressure screening. Blood pressure changes and medicines to control blood pressure can make you feel dizzy.  Strength and balance checks. Your health care provider may recommend certain tests to check your strength and balance while standing, walking, or changing positions.  Foot health exam. Foot pain and numbness, as well as not wearing proper footwear, can make you more likely to have a fall.  Depression screening. You may be more likely to have a fall if you have a fear of falling, feel emotionally low, or feel unable to do activities that you used to do.  Alcohol use screening. Using too much alcohol can affect your balance and may make you more likely to have a fall. What actions can I take to lower my risk of falls? General instructions  Talk with your health care provider about your risks for falling. Tell your health care provider if: ? You fall. Be sure to tell your health care provider about all falls, even ones that seem minor. ? You feel dizzy, sleepy, or off-balance.  Take over-the-counter and prescription medicines only as told by your health care provider. These include any supplements.  Eat a healthy diet and maintain a healthy weight. A healthy diet includes low-fat dairy products, low-fat (lean) meats, and fiber from whole grains, beans, and lots of fruits and vegetables. Home safety  Remove any tripping hazards, such as rugs, cords, and clutter.  Install safety equipment such as  grab bars in bathrooms and safety rails on stairs.  Keep rooms and walkways well-lit. Activity   Follow a regular exercise program to stay fit. This will help you maintain your balance. Ask your health care provider what types of exercise are appropriate for you.  If you need a cane or walker, use it as recommended by your health care provider.  Wear supportive shoes that have nonskid soles. Lifestyle  Do  not drink alcohol if your health care provider tells you not to drink.  If you drink alcohol, limit how much you have: ? 0-1 drink a day for women. ? 0-2 drinks a day for men.  Be aware of how much alcohol is in your drink. In the [place] one drink equals one typical bottle of beer (12 oz), one-half glass of wine (5 oz), or one shot of hard liquor (1 oz).  Do not use any products that contain nicotine or tobacco, such as cigarettes and e-cigarettes. If you need help quitting, ask your health care provider. Summary  Having a healthy lifestyle and getting preventive care can help to protect your health and wellness after age 89.  Screening and testing are the best way to find a health problem early and help you avoid having a fall. Early diagnosis and treatment give you the best chance for managing medical conditions that are more common for people who are older than age 17.  Falls are a major cause of broken bones and head injuries in people who are older than age 70. Take precautions to prevent a fall at home.  Work with your health care provider to learn what changes you can make to improve your health and wellness and to prevent falls. This information is not intended to replace advice given to you by your health care provider. Make sure you discuss any questions you have with your health care provider. Document Revised: 12/03/2018 Document Reviewed: 06/25/2017 Elsevier Patient Education  2020 [employer].

## 2020-02-22 NOTE — Assessment & Plan Note (Signed)
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

## 2020-02-22 NOTE — Assessment & Plan Note (Signed)
Preventative protocols reviewed and updated unless pt declined. Discussed healthy diet and lifestyle.  

## 2020-02-22 NOTE — Assessment & Plan Note (Addendum)
H/o this s/p stent. I don't have details. Continue aspirin and statin.

## 2020-02-23 LAB — MAGNESIUM: Magnesium: 2.2 mg/dL (ref 1.5–2.5)

## 2020-02-23 LAB — COMPREHENSIVE METABOLIC PANEL
ALT: 14 U/L (ref 0–53)
AST: 13 U/L (ref 0–37)
Albumin: 4.3 g/dL (ref 3.5–5.2)
Alkaline Phosphatase: 109 U/L (ref 39–117)
BUN: 18 mg/dL (ref 6–23)
CO2: 27 mEq/L (ref 19–32)
Calcium: 9.1 mg/dL (ref 8.4–10.5)
Chloride: 103 mEq/L (ref 96–112)
Creatinine, Ser: 1.03 mg/dL (ref 0.40–1.50)
GFR: 72.33 mL/min (ref >60.00)
Glucose, Bld: 92 mg/dL (ref 70–99)
Potassium: 4.3 mEq/L (ref 3.5–5.1)
Sodium: 138 mEq/L (ref 135–145)
Total Bilirubin: 0.4 mg/dL (ref 0.2–1.2)
Total Protein: 7.4 g/dL (ref 6.0–8.3)

## 2020-02-23 LAB — LIPID PANEL
Cholesterol: 166 mg/dL (ref 0–200)
HDL: 34.3 mg/dL — ABNORMAL LOW (ref >39.00)
LDL Cholesterol: 104 mg/dL — ABNORMAL HIGH (ref 0–99)
NonHDL: 131.99
Total CHOL/HDL Ratio: 5
Triglycerides: 138 mg/dL (ref 0.0–149.0)
VLDL: 27.6 mg/dL (ref 0.0–40.0)

## 2020-02-23 LAB — PSA: PSA: 0.21 ng/mL (ref 0.10–4.00)

## 2020-02-23 LAB — TSH: TSH: 3.94 u[IU]/mL (ref 0.35–4.50)

## 2020-03-10 ENCOUNTER — Encounter: Payer: Self-pay | Admitting: Internal Medicine

## 2020-03-26 DIAGNOSIS — U071 COVID-19: Secondary | ICD-10-CM

## 2020-03-26 HISTORY — DX: COVID-19: U07.1

## 2020-04-06 ENCOUNTER — Other Ambulatory Visit: Payer: Self-pay

## 2020-04-06 ENCOUNTER — Ambulatory Visit
Admission: RE | Admit: 2020-04-06 | Discharge: 2020-04-06 | Disposition: A | Discharge status: Home / Self Care | Payer: BC Managed Care – PPO | Source: Ambulatory Visit | Attending: Emergency Medicine | Admitting: Emergency Medicine

## 2020-04-06 VITALS — BP 130/83 | HR 76 | Temp 98.3°F | Resp 16

## 2020-04-06 DIAGNOSIS — J069 Acute upper respiratory infection, unspecified: Secondary | ICD-10-CM | POA: Diagnosis not present

## 2020-04-06 NOTE — ED Triage Notes (Signed)
Patient reports he started with a cough Tuesday and fever started yesterday. Patient's employer requiring covid test prior to returning to work.

## 2020-04-06 NOTE — ED Provider Notes (Signed)
Renaldo Fiddler    CSN: [id number] Arrival date & time: 04/06/20  1202      History   Chief Complaint Chief Complaint  Patient presents with  . Fever  . Cough    HPI Roger [name] is a 64 y.o. male.   Patient presents with 4-day history of nonproductive cough.  He has had a fever since yesterday.  T-max 101.5.  Treatment at home with ibuprofen.  He denies sore throat, shortness of breath, abdominal pain, vomiting, diarrhea, or other symptoms.  Patient states his employer is requiring a COVID test.  He is fully vaccinated.  The history is provided by the patient.    Past Medical History:  Diagnosis Date  . Bell's palsy x2   recurrent  . CAD (coronary artery disease) 2004   with stent, unclear details  . Cataract    OD>OS per notes from [facility]  . 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   . History of chicken pox   . History of hypothyroidism as a child  . HLD (hyperlipidemia)   . Thyroid disease     Patient Active Problem List   Diagnosis Date Noted  . Nocturnal leg cramps 02/22/2020  . 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 Diplopia 09/27/2019  . Nausea 08/16/2019  . Health maintenance examination 09/02/2016  . Left-sided low back pain without sciatica 01/23/2016  . Obesity, Class I, BMI 30-34.9 03/09/2015  . Elevated blood pressure reading without diagnosis of hypertension 03/09/2015  . CAD (coronary artery disease)   . HLD (hyperlipidemia)     Past Surgical History:  Procedure Laterality Date  . COLONOSCOPY     normal per patient  . PERCUTANEOUS CORONARY STENT INTERVENTION (PCI-S)  11/08/02   [place]  . TONSILLECTOMY    . TONSILLECTOMY AND ADENOIDECTOMY Bilateral        Home Medications    Prior to Admission medications   Medication Sig Start Date End Date Taking? Authorizing Provider  ALPRAZolam (XANAX) 0.25 MG tablet Take 1-2 tabs (0.25mg -0.50mg ) 30-60 minutes before procedure. May repeat if needed before or during  the exam every 30 minutes as needed Do not drive. 12/04/19   Anson Fret, MD  aspirin (ASPIRIN EC) 81 MG EC tablet Take 81 mg by mouth daily. Swallow whole.    [provider]  atorvastatin (LIPITOR) 20 MG tablet Take 1 tablet (20 mg total) by mouth daily. 08/16/19   Eustaquio Boyden, MD  Multiple Vitamins-Minerals (MULTIVITAMIN ADULT PO) Take 1 tablet by mouth daily.    [provider]  omeprazole (PRILOSEC) 40 MG capsule Take 1 capsule (40 mg total) by mouth daily. 08/16/19   Eustaquio Boyden, MD    Family History Family History  Problem Relation Age of Onset  . Cancer Mother 4       colon  . Cancer Father 66       prostate  . CAD Father 36       massive MI  . CAD Paternal Uncle   . Alcoholism Paternal Uncle   . Stroke Paternal Uncle   . Diabetes Other        maternal side    Social History Social History   Tobacco Use  . Smoking status: Former Smoker    Quit date: 08/26/1982    Years since quitting: 37.6  . Smokeless tobacco: Never Used  Vaping Use  . Vaping Use: Never used  Substance Use Topics  . Alcohol  use: No    Alcohol/week: 0.0 standard drinks  . Drug use: No     Allergies   Patient has no known allergies.   Review of Systems Review of Systems  Constitutional: Positive for fever. Negative for chills.  HENT: Negative for congestion, ear pain, rhinorrhea and sore throat.   Eyes: Negative for pain and visual disturbance.  Respiratory: Positive for cough. Negative for shortness of breath.   Cardiovascular: Negative for chest pain and palpitations.  Gastrointestinal: Negative for abdominal pain, diarrhea and vomiting.  Genitourinary: Negative for dysuria and hematuria.  Musculoskeletal: Negative for arthralgias and back pain.  Skin: Negative for color change and rash.  Neurological: Negative for seizures and syncope.  All other systems reviewed and are negative.    Physical Exam Triage Vital Signs ED Triage Vitals [04/06/20  1208]  Enc Vitals Group     BP      Pulse      Resp      Temp      Temp src      SpO2      Weight      Height      Head Circumference      Peak Flow      Pain Score 0     Pain Loc      Pain Edu?      Excl. in GC?    No data found.  Updated Vital Signs BP 130/83   Pulse 76   Temp 98.3 F (36.8 C)   Resp 16   SpO2 95%   Visual Acuity Right Eye Distance:   Left Eye Distance:   Bilateral Distance:    Right Eye Near:   Left Eye Near:    Bilateral Near:     Physical Exam Vitals and nursing note reviewed.  Constitutional:      General: He is not in acute distress.    Appearance: He is well-developed.  HENT:     Head: Normocephalic and atraumatic.     Right Ear: Tympanic membrane normal.     Left Ear: Tympanic membrane normal.     Nose: Nose normal.     Mouth/Throat:     Mouth: Mucous membranes are moist.     Pharynx: Oropharynx is clear.  Eyes:     Conjunctiva/sclera: Conjunctivae normal.  Cardiovascular:     Rate and Rhythm: Normal rate and regular rhythm.     Heart sounds: No murmur heard.   Pulmonary:     Effort: Pulmonary effort is normal. No respiratory distress.     Breath sounds: Normal breath sounds. No wheezing or rhonchi.  Abdominal:     Palpations: Abdomen is soft.     Tenderness: There is no abdominal tenderness. There is no guarding or rebound.  Musculoskeletal:     Cervical back: Neck supple.  Skin:    General: Skin is warm and dry.     Findings: No rash.  Neurological:     General: No focal deficit present.     Mental Status: He is alert and oriented to person, place, and time.     Gait: Gait normal.  Psychiatric:        Mood and Affect: Mood normal.        Behavior: Behavior normal.      UC Treatments / Results  Labs (all labs ordered are listed, but only abnormal results are displayed) Labs Reviewed  NOVEL CORONAVIRUS, NAA    EKG   Radiology No results found.  Procedures Procedures (including critical care  time)  Medications Ordered in UC Medications - No data to display  Initial Impression / Assessment and Plan / UC Course  I have reviewed the triage vital signs and the nursing notes.  Pertinent labs & imaging results that were available during my care of the patient were reviewed by me and considered in my medical decision making (see chart for details).   Upper respiratory infection.  PCR COVID pending.  Instructed patient to self quarantine until the test result is back.  Discussed symptomatic treatment, including Tylenol, rest, hydration.  Instructed patient to go to the ED he has acute worsening symptoms.  Patient agrees to plan of care.     Final Clinical Impressions(s) / UC Diagnoses   Final diagnoses:  Upper respiratory tract infection, unspecified type     Discharge Instructions     Your COVID test is pending.  You should self quarantine until the test result is back.    Take Tylenol as needed for fever or discomfort.  Rest and keep yourself hydrated.    Go to the emergency department if you develop acute worsening symptoms.        ED Prescriptions    None     PDMP not reviewed this encounter.   Mickie Bail, NP 04/06/20 1301

## 2020-04-06 NOTE — Discharge Instructions (Signed)
Your COVID test is pending.  You should self quarantine until the test result is back.    Take Tylenol as needed for fever or discomfort.  Rest and keep yourself hydrated.    Go to the emergency department if you develop acute worsening symptoms.     

## 2020-04-08 LAB — SARS-COV-2, NAA 2 DAY TAT

## 2020-04-08 LAB — NOVEL CORONAVIRUS, NAA: SARS-CoV-2, NAA: DETECTED — AB

## 2020-04-09 ENCOUNTER — Telehealth (HOSPITAL_COMMUNITY): Payer: Self-pay | Admitting: Nurse Practitioner

## 2020-04-09 NOTE — Telephone Encounter (Signed)
Called to Discuss with patient about Covid symptoms and the use of regeneron, a monoclonal antibody infusion for those with mild to moderate Covid symptoms and at a high risk of hospitalization.     Pt is qualified for this infusion at the [facility] center due to co-morbid conditions and/or a member of an at-risk group.     Unable to reach pt. Left message to return call with wife Bonita Quin. Sent mychart message  Consuello Masse, DNP, AGNP-C [phone] (Infusion Center Hotline)

## 2020-04-12 ENCOUNTER — Telehealth: Payer: Self-pay

## 2020-04-12 NOTE — Telephone Encounter (Signed)
Noted. Thanks.

## 2020-04-12 NOTE — Telephone Encounter (Signed)
Spoke with pt/pt's wife, Roger [name], offering antibody infusion for pt.  Pt declines at time but will let us know if he changes his mind.  FYI to Dr. Reece Agar and Dr. Para March.

## 2020-04-12 NOTE — Telephone Encounter (Signed)
Glad he's feeling better.  Would offer mAb infusion and report demographic info to infusion team.

## 2020-04-12 NOTE — Telephone Encounter (Addendum)
Received MyChart message from pt's wife, Bonita Quin, stating she and pt tested positive for COVID.  Spoke with pt/pt's wife, asking about his sxs.  States he has a cough but it is better than it was and has some body achiness that is also getting better.  Says he had a fever- 101 but has not had one for several days now.  Sxs started 04/05/20.  Pt denies any other sxs (ie., SOB, loss of taste/smell, etc).  Pt is vaccinated.   I offered a virtual visit with another provider since Dr. Reece Agar is out of the office.  Pt declines stating he feels he's getting better.  Pt/pt's wife just wanted to make Dr. Reece Agar aware of test results and to see if there is anything else he (they) should be doing.   Says a [facility] nurse has informed him about wearing mask in the home and separating as much as possible.  Informed pt to seek emergent care if cough worsens, fever comes back or if he develops SOB.  Also, informed pt his message will be forwarded to Dr. Reece Agar and the provider covering for him.  Pt verbalizes understanding and expresses his thanks.  Plz advise.   Placed pt on Call Log.

## 2020-04-13 NOTE — Telephone Encounter (Signed)
Would still report demographic info to infusion team so they can discuss pros/cons of infusion as there's a time frame (10 days) where he would be eligible.

## 2020-04-14 ENCOUNTER — Telehealth: Payer: Self-pay | Admitting: Adult Health

## 2020-04-14 NOTE — Telephone Encounter (Signed)
Lvm with pt's demographics at [facility].

## 2020-04-14 NOTE — Telephone Encounter (Signed)
Called and LMOM regarding monoclonal antibody treatment for COVID 19 given to those who are at risk for complications and/or hospitalization of the virus.  Patient meets criteria based on: age, heart disease, bmi greater than 25   Call back number given: [phone]  My chart message: sent  Lillard Anes, NP

## 2020-04-14 NOTE — Telephone Encounter (Signed)
Spoke with pt's wife, Bonita Quin (on dpr), asking for update on pt.  States he is much better.  Pt has minor cough at night.  But no more body aches, fatigue and pt has appetite back.

## 2020-04-16 NOTE — Telephone Encounter (Signed)
Glad he's feeling better.

## 2020-04-20 ENCOUNTER — Telehealth: Payer: Self-pay | Admitting: Family Medicine

## 2020-04-20 NOTE — Telephone Encounter (Signed)
fmla paperwork in dr g in box

## 2020-04-26 NOTE — Telephone Encounter (Signed)
Filled and in my outbox

## 2020-04-26 NOTE — Telephone Encounter (Signed)
Paperwork faxed °

## 2020-05-10 ENCOUNTER — Encounter: Payer: BC Managed Care – PPO | Admitting: Internal Medicine

## 2020-07-03 ENCOUNTER — Other Ambulatory Visit: Payer: Self-pay

## 2020-07-03 ENCOUNTER — Emergency Department
Admission: EM | Admit: 2020-07-03 | Discharge: 2020-07-04 | Disposition: A | Discharge status: Home / Self Care | Payer: BC Managed Care – PPO | Attending: Emergency Medicine | Admitting: Emergency Medicine

## 2020-07-03 DIAGNOSIS — L509 Urticaria, unspecified: Secondary | ICD-10-CM

## 2020-07-03 DIAGNOSIS — I2510 Atherosclerotic heart disease of native coronary artery without angina pectoris: Secondary | ICD-10-CM | POA: Diagnosis not present

## 2020-07-03 DIAGNOSIS — Z7982 Long term (current) use of aspirin: Secondary | ICD-10-CM | POA: Insufficient documentation

## 2020-07-03 DIAGNOSIS — T7840XA Allergy, unspecified, initial encounter: Secondary | ICD-10-CM | POA: Diagnosis not present

## 2020-07-03 DIAGNOSIS — Z9861 Coronary angioplasty status: Secondary | ICD-10-CM | POA: Diagnosis not present

## 2020-07-03 DIAGNOSIS — R600 Localized edema: Secondary | ICD-10-CM | POA: Diagnosis present

## 2020-07-03 MED ORDER — FAMOTIDINE IN NACL 20-0.9 MG/50ML-% IV SOLN
20.0000 mg | Freq: Once | INTRAVENOUS | Status: AC
  Administered 2020-07-04: 20 mg via INTRAVENOUS
  Filled 2020-07-03: qty 50

## 2020-07-03 MED ORDER — METHYLPREDNISOLONE SODIUM SUCC 125 MG IJ SOLR
125.0000 mg | Freq: Once | INTRAMUSCULAR | Status: AC
  Administered 2020-07-04: 125 mg via INTRAVENOUS
  Filled 2020-07-03: qty 2

## 2020-07-03 NOTE — ED Triage Notes (Signed)
Pt presents to ER c/o allergic reaction.  Pt states he was bitten by something earlier this afternoon.  Pt began noticing facial swelling when he woke up tonight at 11.  Pt has swollen lips and cheeks in triage.  Pt denies SOB. Pt denies any changes in food, detergent or anything else.

## 2020-07-04 MED ORDER — EPINEPHRINE 0.3 MG/0.3ML IJ SOAJ: 0.3000 mg | INTRAMUSCULAR | 0 refills | Status: DC | PRN

## 2020-07-04 NOTE — ED Provider Notes (Signed)
[facility] Emergency Department Provider Note   ____________________________________________   First MD Initiated Contact with Patient 07/03/20 2353     (approximate)  I have reviewed the triage vital signs and the nursing notes.   HISTORY  Chief Complaint Allergic Reaction    HPI Roger [name] is a 64 y.o. male with a stated past medical history of hypertension, hypothyroidism, and CAD who presents for facial swelling that began approximately 6 hours prior to arrival.  Patient states that he was bitten by something on the back of his neck earlier this afternoon and developed symptoms approximately 2-3 hours later.  Patient attempted to get into a hot shower to improve the symptoms which initially improved but then started worsening again and family called EMS.  Patient took 50 mg p.o. Benadryl prior to EMS arrival and patient states that it has halted his symptoms but they have not fully gone away.  Patient does state that the red, patchy rash over his abdomen has improved in color but he is still feeling itchy.  Patient denies any difficulty swallowing or breathing or change in speech         Past Medical History:  Diagnosis Date  . Bell's palsy x2   recurrent  . CAD (coronary artery disease) 2004   with stent, unclear details  . Cataract    OD>OS per notes from [facility]  . 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   . History of chicken pox   . History of hypothyroidism as a child  . HLD (hyperlipidemia)   . Thyroid disease     Patient Active Problem List   Diagnosis Date Noted  . Nocturnal leg cramps 02/22/2020  . 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 Diplopia 09/27/2019  . Nausea 08/16/2019  . Health maintenance examination 09/02/2016  . Left-sided low back pain without sciatica 01/23/2016  . Obesity, Class I, BMI 30-34.9 03/09/2015  . Elevated blood pressure reading without diagnosis of hypertension 03/09/2015   . CAD (coronary artery disease)   . HLD (hyperlipidemia)     Past Surgical History:  Procedure Laterality Date  . COLONOSCOPY     normal per patient  . PERCUTANEOUS CORONARY STENT INTERVENTION (PCI-S)  11/08/02   [place]  . TONSILLECTOMY    . TONSILLECTOMY AND ADENOIDECTOMY Bilateral     Prior to Admission medications   Medication Sig Start Date End Date Taking? Authorizing Provider  ALPRAZolam (XANAX) 0.25 MG tablet Take 1-2 tabs (0.25mg -0.50mg ) 30-60 minutes before procedure. May repeat if needed before or during the exam every 30 minutes as needed Do not drive. 12/04/19   Anson Fret, MD  aspirin (ASPIRIN EC) 81 MG EC tablet Take 81 mg by mouth daily. Swallow whole.    [provider]  atorvastatin (LIPITOR) 20 MG tablet Take 1 tablet (20 mg total) by mouth daily. 08/16/19   Eustaquio Boyden, MD  EPINEPHrine 0.3 mg/0.3 mL IJ SOAJ injection Inject 0.3 mg into the muscle as needed for anaphylaxis. 07/04/20   Merwyn Katos, MD  Multiple Vitamins-Minerals (MULTIVITAMIN ADULT PO) Take 1 tablet by mouth daily.    [provider]  omeprazole (PRILOSEC) 40 MG capsule Take 1 capsule (40 mg total) by mouth daily. 08/16/19   Eustaquio Boyden, MD    Allergies Patient has no known allergies.  Family History  Problem Relation Age of Onset  . Cancer Mother 43       colon  .  Cancer Father 9       prostate  . CAD Father 40       massive MI  . CAD Paternal Uncle   . Alcoholism Paternal Uncle   . Stroke Paternal Uncle   . Diabetes Other        maternal side    Social History Social History   Tobacco Use  . Smoking status: Former Smoker    Quit date: 08/26/1982    Years since quitting: 37.8  . Smokeless tobacco: Never Used  Vaping Use  . Vaping Use: Never used  Substance Use Topics  . Alcohol use: No    Alcohol/week: 0.0 standard drinks  . Drug use: No    Review of Systems Constitutional: No fever/chills Eyes: No visual changes. ENT: No sore  throat. Cardiovascular: Denies chest pain. Respiratory: Denies shortness of breath. Gastrointestinal: No abdominal pain.  No nausea, no vomiting.  No diarrhea. Genitourinary: Negative for dysuria. Musculoskeletal: Negative for acute arthralgias Skin: Negative for rash.  Positive for swollen lips and cheeks Neurological: Negative for headaches, weakness/numbness/paresthesias in any extremity Psychiatric: Negative for suicidal ideation/homicidal ideation   ____________________________________________   PHYSICAL EXAM:  VITAL SIGNS: ED Triage Vitals  Enc Vitals Group     BP 07/03/20 2354 (!) 160/103     Pulse Rate 07/03/20 2354 (!) 58     Resp 07/03/20 2354 18     Temp 07/03/20 2354 97.8 F (36.6 C)     Temp Source 07/03/20 2354 Oral     SpO2 07/03/20 2354 97 %     Weight 07/03/20 2355 176 lb (79.8 kg)     Height 07/03/20 2355 5\' 5"  (1.651 m)     Head Circumference --      Peak Flow --      Pain Score 07/03/20 2355 0     Pain Loc --      Pain Edu? --      Excl. in GC? --    Constitutional: Alert and oriented. Well appearing and in no acute distress. Eyes: Conjunctivae are normal. PERRL. Head: Atraumatic. Nose: No congestion/rhinnorhea. Mouth/Throat: Mucous membranes are moist. Neck: No stridor Cardiovascular: Grossly normal heart sounds.  Good peripheral circulation. Respiratory: Normal respiratory effort.  No retractions. Gastrointestinal: Soft and nontender. No distention. Musculoskeletal: No obvious deformities Neurologic:  Normal speech and language. No gross focal neurologic deficits are appreciated. Skin:  Skin is warm and dry.  Mildly erythematous urticaria over trunk.  Edema of the lips and perioral area without erythema Psychiatric: Mood and affect are normal. Speech and behavior are normal.  ____________________________________________   LABS (all labs ordered are listed, but only abnormal results are displayed)  Labs Reviewed - No data to  display   PROCEDURES  Procedure(s) performed (including Critical Care):  .1-3 Lead EKG Interpretation Performed by: 13/08/21, MD Authorized by: Merwyn Katos, MD     Interpretation: normal     ECG rate:  79   ECG rate assessment: normal     Rhythm: sinus rhythm     Ectopy: none     Conduction: normal       ____________________________________________   INITIAL IMPRESSION / ASSESSMENT AND PLAN / ED COURSE  As part of my medical decision making, I reviewed the following data within the electronic MEDICAL RECORD NUMBER Nursing notes reviewed and incorporated, Labs reviewed, Old chart reviewed, and Notes from prior ED visits reviewed and incorporated        + Cutaneous hives/erythema, upper and  lower lip swelling -Airway involvement No evidence of multiorgan involvement  Given history and exam, presentation most consistent with allergic reaction. I have low suspicion for toxic shock syndrome, anaphylaxis, asthma exacerbation, or drug toxicity. Rx: Prednisone 60mg  qday x3days, Benadryl 25mg  q8hr x3days Disposition: Discharge home with SRP. Follow up with PCP in 1-2 days.      ____________________________________________   FINAL CLINICAL IMPRESSION(S) / ED DIAGNOSES  Final diagnoses:  Allergic reaction, initial encounter  Urticaria     ED Discharge Orders         Ordered    EPINEPHrine 0.3 mg/0.3 mL IJ SOAJ injection  As needed        07/04/20 0334           Note:  This document was prepared using Dragon voice recognition software and may include unintentional dictation errors.   , MD 07/04/20 [phone]

## 2020-07-05 ENCOUNTER — Ambulatory Visit (INDEPENDENT_AMBULATORY_CARE_PROVIDER_SITE_OTHER): Payer: BC Managed Care – PPO | Admitting: Family Medicine

## 2020-07-05 ENCOUNTER — Encounter: Payer: Self-pay | Admitting: Family Medicine

## 2020-07-05 ENCOUNTER — Other Ambulatory Visit: Payer: Self-pay

## 2020-07-05 VITALS — BP 138/82 | HR 60 | Temp 97.8°F | Ht 65.0 in | Wt 172.4 lb

## 2020-07-05 DIAGNOSIS — Z23 Encounter for immunization: Secondary | ICD-10-CM

## 2020-07-05 DIAGNOSIS — Z91038 Other insect allergy status: Secondary | ICD-10-CM | POA: Diagnosis not present

## 2020-07-05 DIAGNOSIS — L509 Urticaria, unspecified: Secondary | ICD-10-CM

## 2020-07-05 DIAGNOSIS — T783XXD Angioneurotic edema, subsequent encounter: Secondary | ICD-10-CM | POA: Diagnosis not present

## 2020-07-05 NOTE — Progress Notes (Signed)
This visit was conducted in person.  BP 138/82 (BP Location: Left Arm, Patient Position: Sitting, Cuff Size: Normal)   Pulse 60   Temp 97.8 F (36.6 C) (Temporal)   Ht 5\' 5"  (1.651 m)   Wt 172 lb 6 oz (78.2 kg)   SpO2 97%   BMI 28.68 kg/m    CC: ER f/u visit  Subjective:    Patient ID: , male    DOB: 1956-02-01, 64 y.o.   MRN: [id number]  HPI: Roger [name] is a 64 y.o. male presenting on 07/05/2020 for Hospitalization Follow-up (Seen at [facility] ED on 07/03/20.)   Birthday today!  DOI: 07/03/2020 Seen at [facility] ER on Monday after possible bug bite to back of his neck with subsequent allergic reaction including facial swelling (lip swelling), red rash to trunk (hives). No dyspnea or dysphagia. Managed at home with 50mg  benadryl with benefit. Treated at ER with IV methylprednisolone 125mg  and famotidine 20mg , discussed oral course of 60mg  prednisone course x3 days and benadryl (he never received prednisone course). Also prescribed epi pen which he has filled.   Doesn't remember bug bite but he did carry heavy propane canister on shoulder and wonders if bug was on it. He has some erosions to the base of his neck.   Since home, facial swelling slowly improving.  Never had dyspnea, throat swelling, abdominal pain, nausea, pedal edema.  BP elevated at ER 160/103. Today BP well controlled off medication.   He had localized swelling to spider bite several years ago but no prior systemic reactions.   No new foods, medicines, supplements.  No new lotions, detergents, soaps or shampoos.      Relevant past medical, surgical, family and social history reviewed and updated as indicated. Interim medical history since our last visit reviewed. Allergies and medications reviewed and updated. Outpatient Medications Prior to Visit  Medication Sig Dispense Refill  . ALPRAZolam (XANAX) 0.25 MG tablet Take 1-2 tabs (0.25mg -0.50mg ) 30-60 minutes before procedure. May repeat if needed  before or during the exam every 30 minutes as needed Do not drive. 6 tablet 0  . aspirin (ASPIRIN EC) 81 MG EC tablet Take 81 mg by mouth daily. Swallow whole.    Sunday atorvastatin (LIPITOR) 20 MG tablet Take 1 tablet (20 mg total) by mouth daily. 90 tablet 3  . EPINEPHrine 0.3 mg/0.3 mL IJ SOAJ injection Inject 0.3 mg into the muscle as needed for anaphylaxis. 2 each 0  . Multiple Vitamins-Minerals (MULTIVITAMIN ADULT PO) Take 1 tablet by mouth daily.    omeprazole (PRILOSEC) 40 MG capsule Take 1 capsule (40 mg total) by mouth daily. 30 capsule 1   No facility-administered medications prior to visit.     Per HPI unless specifically indicated in ROS section below Review of Systems Objective:  BP 138/82 (BP Location: Left Arm, Patient Position: Sitting, Cuff Size: Normal)   Pulse 60   Temp 97.8 F (36.6 C) (Temporal)   Ht 5\' 5"  (1.651 m)   Wt 172 lb 6 oz (78.2 kg)   SpO2 97%   BMI 28.68 kg/m   Wt Readings from Last 3 Encounters:  07/05/20 172 lb 6 oz (78.2 kg)  07/03/20 176 lb (79.8 kg)  02/22/20 171 lb 5 oz (77.7 kg)      Physical Exam Vitals and nursing note reviewed.  Constitutional:      Appearance: Normal appearance. He is not ill-appearing.  HENT:     Head: Normocephalic and atraumatic.  Mouth/Throat:     Mouth: Mucous membranes are moist. No angioedema.     Tongue: No lesions.     Pharynx: Oropharynx is clear. No pharyngeal swelling, oropharyngeal exudate or posterior oropharyngeal erythema.  Eyes:     Extraocular Movements: Extraocular movements intact.     Pupils: Pupils are equal, round, and reactive to light.  Cardiovascular:     Rate and Rhythm: Normal rate and regular rhythm.     Pulses: Normal pulses.     Heart sounds: Normal heart sounds. No murmur heard.   Pulmonary:     Effort: Pulmonary effort is normal. No respiratory distress.     Breath sounds: Normal breath sounds. No wheezing, rhonchi or rales.  Musculoskeletal:     Right lower leg: No edema.       Left lower leg: No edema.  Lymphadenopathy:     Cervical: No cervical adenopathy.  Skin:    General: Skin is warm and dry.     Findings: Lesion present. No rash.          Comments: 2 small erosions to base of posterior neck without surrounding erythema   Neurological:     Mental Status: He is alert.  Psychiatric:        Mood and Affect: Mood normal.        Behavior: Behavior normal.       Assessment & Plan:  This visit occurred during the SARS-CoV-2 public health emergency.  Safety protocols were in place, including screening questions prior to the visit, additional usage of staff PPE, and extensive cleaning of exam room while observing appropriate contact time as indicated for disinfecting solutions.   Problem List Items Addressed This Visit    Allergic reaction to insect bite - Primary    Angioedema, hives presumed related to insect bite s/p ER evaluation treated with benadryl, pepcid, and methylprednisolone IV. Symptoms largely resolved. He has epi pen at home - reviewed indications for use (anaphylaxis, dyspnea, throat swelling, etc). Reviewed need to seek ER care if used.  Will defer allergist evaluation at this time, but if any recurrent allergic symptoms will refer. Pt agrees with plan.        Other Visit Diagnoses    Need for influenza vaccination       Relevant Orders   Flu Vaccine QUAD High Dose(Fluad) (Completed)   Urticaria       Angioedema, subsequent encounter           No orders of the defined types were placed in this encounter.  Orders Placed This Encounter  Procedures  . Flu Vaccine QUAD High Dose(Fluad)    Patient instructions: Flu shot today.  Continue benadryl for the next 2 days.  Possible allergic reaction to bug bite.  Keep epi pen on hand.  If recurrent hives or allergic symptoms let us know right away - we will refer you to allergist.  If throat swelling, shortness of breath, use epi pen and go to ER or call 911.   Follow up  plan: Return if symptoms worsen or fail to improve.  Eustaquio Boyden, MD

## 2020-07-05 NOTE — Assessment & Plan Note (Addendum)
Angioedema, hives presumed related to insect bite s/p ER evaluation treated with benadryl, pepcid, and methylprednisolone IV. Symptoms largely resolved. He has epi pen at home - reviewed indications for use (anaphylaxis, dyspnea, throat swelling, etc). Reviewed need to seek ER care if used.  Will defer allergist evaluation at this time, but if any recurrent allergic symptoms will refer. Pt agrees with plan.

## 2020-07-05 NOTE — Patient Instructions (Addendum)
Flu shot today.  Continue benadryl for the next 2 days.  Possible allergic reaction to bug bite.  Keep epi pen on hand.  If recurrent hives or allergic symptoms let us know right away - we will refer you to allergist.  If throat swelling, shortness of breath, use epi pen and go to ER or call 911.   Hives Hives (urticaria) are itchy, red, swollen areas on the skin. Hives can appear on any part of the body. Hives often fade within 24 hours (acute hives). Sometimes, new hives appear after old ones fade and the cycle can continue for several days or weeks (chronic hives). Hives do not spread from person to person (are not contagious). Hives come from the body's reaction to something a person is allergic to (allergen), something that causes irritation, or various other triggers. When a person is exposed to a trigger, his or her body releases a chemical (histamine) that causes redness, itching, and swelling. Hives can appear right after exposure to a trigger or hours later. What are the causes? This condition may be caused by:  Allergies to foods or ingredients.  Insect bites or stings.  Exposure to pollen or pets.  Contact with latex or chemicals.  Spending time in sunlight, heat, or cold (exposure).  Exercise.  Stress.  Certain medicines. You can also get hives from other medical conditions and treatments, such as:  Viruses, including the common cold.  Bacterial infections, such as urinary tract infections and strep throat.  Certain medicines.  Allergy shots.  Blood transfusions. Sometimes, the cause of this condition is not known (idiopathic hives). What increases the risk? You are more likely to develop this condition if you:  Are a woman.  Have food allergies, especially to citrus fruits, milk, eggs, peanuts, tree nuts, or shellfish.  Are allergic to: ? Medicines. ? Latex. ? Insects. ? Animals. ? Pollen. What are the signs or symptoms? Common symptoms of this  condition include raised, itchy, red or white bumps or patches on your skin. These areas may:  Become large and swollen (welts).  Change in shape and location, quickly and repeatedly.  Be separate hives or connect over a large area of skin.  Sting or become painful.  Turn white when pressed in the center (blanch). In severe cases, yourhands, feet, and face may also become swollen. This may occur if hives develop deeper in your skin. How is this diagnosed? This condition may be diagnosed by your symptoms, medical history, and physical exam.  Your skin, urine, or blood may be tested to find out what is causing your hives and to rule out other health issues.  Your health care provider may also remove a small sample of skin from the affected area and examine it under a microscope (biopsy). How is this treated? Treatment for this condition depends on the cause and severity of your symptoms. Your health care provider may recommend using cool, wet cloths (cool compresses) or taking cool showers to relieve itching. Treatment may include:  Medicines that help: ? Relieve itching (antihistamines). ? Reduce swelling (corticosteroids). ? Treat infection (antibiotics).  An injectable medicine (omalizumab). Your health care provider may prescribe this if you have chronic idiopathic hives and you continue to have symptoms even after treatment with antihistamines. Severe cases may require an emergency injection of adrenaline (epinephrine) to prevent a life-threatening allergic reaction (anaphylaxis). Follow these instructions at home: Medicines  Take and apply over-the-counter and prescription medicines only as told by your health care provider.  If you were prescribed an antibiotic medicine, take it as told by your health care provider. Do not stop using the antibiotic even if you start to feel better. Skin care  Apply cool compresses to the affected areas.  Do not scratch or rub your  skin. General instructions  Do not take hot showers or baths. This can make itching worse.  Do not wear tight-fitting clothing.  Use sunscreen and wear protective clothing when you are outside.  Avoid any substances that cause your hives. Keep a journal to help track what causes your hives. Write down: ? What medicines you take. ? What you eat and drink. ? What products you use on your skin.  Keep all follow-up visits as told by your health care provider. This is important. Contact a health care provider if:  Your symptoms are not controlled with medicine.  Your joints are painful or swollen. Get help right away if:  You have a fever.  You have pain in your abdomen.  Your tongue or lips are swollen.  Your eyelids are swollen.  Your chest or throat feels tight.  You have trouble breathing or swallowing. These symptoms may represent a serious problem that is an emergency. Do not wait to see if the symptoms will go away. Get medical help right away. Call your local emergency services (911 in the [place] Do not drive yourself to the hospital. Summary  Hives (urticaria) are itchy, red, swollen areas on your skin. Hives come from the body's reaction to something a person is allergic to (allergen), something that causes irritation, or various other triggers.  Treatment for this condition depends on the cause and severity of your symptoms.  Avoid any substances that cause your hives. Keep a journal to help track what causes your hives.  Take and apply over-the-counter and prescription medicines only as told by your health care provider.  Keep all follow-up visits as told by your health care provider. This is important. This information is not intended to replace advice given to you by your health care provider. Make sure you discuss any questions you have with your health care provider. Document Revised: 02/25/2018 Document Reviewed: 02/25/2018 Elsevier Patient Education  2020  [employer].

## 2020-08-14 ENCOUNTER — Other Ambulatory Visit: Payer: Self-pay

## 2020-08-14 ENCOUNTER — Emergency Department (HOSPITAL_COMMUNITY): Payer: PRIVATE HEALTH INSURANCE

## 2020-08-14 ENCOUNTER — Emergency Department (HOSPITAL_COMMUNITY)
Admission: EM | Admit: 2020-08-14 | Discharge: 2020-08-14 | Disposition: A | Discharge status: Home / Self Care | Payer: PRIVATE HEALTH INSURANCE | Attending: Emergency Medicine | Admitting: Emergency Medicine

## 2020-08-14 DIAGNOSIS — Z87891 Personal history of nicotine dependence: Secondary | ICD-10-CM | POA: Diagnosis not present

## 2020-08-14 DIAGNOSIS — Y92512 Supermarket, store or market as the place of occurrence of the external cause: Secondary | ICD-10-CM | POA: Insufficient documentation

## 2020-08-14 DIAGNOSIS — I2510 Atherosclerotic heart disease of native coronary artery without angina pectoris: Secondary | ICD-10-CM | POA: Insufficient documentation

## 2020-08-14 DIAGNOSIS — Y990 Civilian activity done for income or pay: Secondary | ICD-10-CM | POA: Diagnosis not present

## 2020-08-14 DIAGNOSIS — Z8616 Personal history of COVID-19: Secondary | ICD-10-CM | POA: Diagnosis not present

## 2020-08-14 DIAGNOSIS — M25562 Pain in left knee: Secondary | ICD-10-CM | POA: Insufficient documentation

## 2020-08-14 DIAGNOSIS — W208XXA Other cause of strike by thrown, projected or falling object, initial encounter: Secondary | ICD-10-CM | POA: Diagnosis not present

## 2020-08-14 DIAGNOSIS — W19XXXA Unspecified fall, initial encounter: Secondary | ICD-10-CM

## 2020-08-14 DIAGNOSIS — Z7982 Long term (current) use of aspirin: Secondary | ICD-10-CM | POA: Diagnosis not present

## 2020-08-14 MED ORDER — FENTANYL CITRATE (PF) 100 MCG/2ML IJ SOLN
50.0000 ug | Freq: Once | INTRAMUSCULAR | Status: AC
  Administered 2020-08-14: 18:00:00 50 ug via INTRAVENOUS
  Filled 2020-08-14: qty 2

## 2020-08-14 MED ORDER — FENTANYL CITRATE (PF) 100 MCG/2ML IJ SOLN: 50.0000 ug | Freq: Once | INTRAMUSCULAR | Status: DC

## 2020-08-14 MED ORDER — ACETAMINOPHEN 325 MG PO TABS: 650.0000 mg | ORAL_TABLET | Freq: Four times a day (QID) | ORAL | 0 refills | Status: AC

## 2020-08-14 MED ORDER — NAPROXEN 375 MG PO TABS: 375.0000 mg | ORAL_TABLET | Freq: Two times a day (BID) | ORAL | 0 refills | Status: AC

## 2020-08-14 NOTE — ED Provider Notes (Signed)
[facility] EMERGENCY DEPARTMENT Provider Note   CSN: [id number] Arrival date & time: 08/14/20  1608     History Chief Complaint  Patient presents with  . Fall    Taliesin Hartlage is a 64 y.o. male.  HPI   64 year old male with history of recurrent Bell's palsy, CAD, Covid, hyperlipidemia, thyroid disease, who presents the emergency department today for evaluation after a fall.  Patient works at [employer] and was next to several pallets which started to fall on him.  He attempted to move out of the way but the pallets fell onto his left leg, he twisted his leg and then fell to the ground.  He is complaining of pain to the left knee and tib-fib/fib area.  He denies any head trauma or LOC.  He denies any other injuries elsewhere.  Past Medical History:  Diagnosis Date  . Bell's palsy x2   recurrent  . CAD (coronary artery disease) 2004   with stent, unclear details  . Cataract    OD>OS per notes from [facility]  . COVID-19 virus infection 03/2020  . 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   . History of chicken pox   . History of hypothyroidism as a child  . HLD (hyperlipidemia)   . Thyroid disease     Patient Active Problem List   Diagnosis Date Noted  . Allergic reaction to insect bite 07/05/2020  . Nocturnal leg cramps 02/22/2020  . 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 Diplopia 09/27/2019  . Nausea 08/16/2019  . Health maintenance examination 09/02/2016  . Left-sided low back pain without sciatica 01/23/2016  . Obesity, Class I, BMI 30-34.9 03/09/2015  . Elevated blood pressure reading without diagnosis of hypertension 03/09/2015  . CAD (coronary artery disease)   . HLD (hyperlipidemia)     Past Surgical History:  Procedure Laterality Date  . COLONOSCOPY     normal per patient  . PERCUTANEOUS CORONARY STENT INTERVENTION (PCI-S)  11/08/02   [place]  . TONSILLECTOMY    . TONSILLECTOMY AND ADENOIDECTOMY Bilateral         Family History  Problem Relation Age of Onset  . Cancer Mother 88       colon  . Cancer Father 75       prostate  . CAD Father 14       massive MI  . CAD Paternal Uncle   . Alcoholism Paternal Uncle   . Stroke Paternal Uncle   . Diabetes Other        maternal side    Social History   Tobacco Use  . Smoking status: Former Smoker    Quit date: 08/26/1982    Years since quitting: 37.9  . Smokeless tobacco: Never Used  Vaping Use  . Vaping Use: Never used  Substance Use Topics  . Alcohol use: No    Alcohol/week: 0.0 standard drinks  . Drug use: No    Home Medications Prior to Admission medications   Medication Sig Start Date End Date Taking? Authorizing Provider  acetaminophen (TYLENOL) 325 MG tablet Take 2 tablets (650 mg total) by mouth every 6 (six) hours for 7 days. 08/14/20 08/21/20  Elvina Bosch S, PA-C  ALPRAZolam (XANAX) 0.25 MG tablet Take 1-2 tabs (0.25mg -0.50mg ) 30-60 minutes before procedure. May repeat if needed before or during the exam every 30 minutes as needed Do not drive. 12/04/19   Anson Fret, MD  aspirin (ASPIRIN EC) 81  MG EC tablet Take 81 mg by mouth daily. Swallow whole.    [provider]  atorvastatin (LIPITOR) 20 MG tablet Take 1 tablet (20 mg total) by mouth daily. 08/16/19   Eustaquio Boyden, MD  EPINEPHrine 0.3 mg/0.3 mL IJ SOAJ injection Inject 0.3 mg into the muscle as needed for anaphylaxis. 07/04/20   Merwyn Katos, MD  Multiple Vitamins-Minerals (MULTIVITAMIN ADULT PO) Take 1 tablet by mouth daily.    [provider]  naproxen (NAPROSYN) 375 MG tablet Take 1 tablet (375 mg total) by mouth 2 (two) times daily for 7 days. 08/14/20 08/21/20  Lijah Bourque S, PA-C  omeprazole (PRILOSEC) 40 MG capsule Take 1 capsule (40 mg total) by mouth daily. 08/16/19   Eustaquio Boyden, MD    Allergies    Patient has no known allergies.  Review of Systems   Review of Systems  Constitutional: Negative for fever.   HENT: Negative for sore throat.   Eyes: Negative for visual disturbance.  Respiratory: Negative for shortness of breath.   Cardiovascular: Negative for chest pain.  Gastrointestinal: Negative for abdominal pain.  Genitourinary: Negative for flank pain.  Musculoskeletal: Negative for back pain and neck pain.       Pain to left knee and left lower leg  Skin: Negative for wound.  Neurological: Negative for weakness, numbness and headaches.       No head trauma or loc  All other systems reviewed and are negative.   Physical Exam Updated Vital Signs BP (!) 149/79 (BP Location: Right Arm)   Pulse 61   Temp (!) 97.5 F (36.4 C) (Oral)   Resp 18   Ht 5\' 5"  (1.651 m)   Wt 79.8 kg   SpO2 97%   BMI 29.29 kg/m   Physical Exam Vitals and nursing note reviewed.  Constitutional:      Appearance: He is well-developed and well-nourished.  HENT:     Head: Normocephalic and atraumatic.  Eyes:     Conjunctiva/sclera: Conjunctivae normal.  Cardiovascular:     Rate and Rhythm: Normal rate and regular rhythm.     Heart sounds: No murmur heard.   Pulmonary:     Effort: Pulmonary effort is normal. No respiratory distress.     Breath sounds: Normal breath sounds.  Abdominal:     General: Bowel sounds are normal.     Palpations: Abdomen is soft.     Tenderness: There is no abdominal tenderness.  Musculoskeletal:        General: No edema.     Cervical back: Neck supple.     Comments: No TTP to bilat hips, pelvis stable to compression. TTP over the left patella and medial joint line. TTP along the proximal tib/fib. No joint laxity with varus/valgus stress or with anterior/posterior drawer testing. No TTP throughout the ankle/foot and normal distal pulses.  Skin:    General: Skin is warm and dry.  Neurological:     Mental Status: He is alert.  Psychiatric:        Mood and Affect: Mood and affect normal.     ED Results / Procedures / Treatments   Labs (all labs ordered are listed,  but only abnormal results are displayed) Labs Reviewed - No data to display  EKG None  Radiology DG Tibia/Fibula Left  Result Date: 08/14/2020 CLINICAL DATA:  08/16/2020 EXAM: LEFT TIBIA AND FIBULA - 2 VIEW; LEFT KNEE - COMPLETE 4+ VIEW COMPARISON:  None. FINDINGS: Left knee: Frontal, bilateral oblique, and lateral views  of the left knee are obtained. There is 3 compartmental osteoarthritis, moderate in severity and greatest in the patellofemoral compartment. No fracture, subluxation, or dislocation. Soft tissues are unremarkable. No joint effusion. Left tibia/fibula: Frontal and lateral views demonstrate no fractures. Alignment is anatomic. Soft tissues are normal. IMPRESSION: 1. Moderate 3 compartmental left knee osteoarthritis. 2. No acute bony abnormalities of the left knee or left lower leg. Electronically Signed   By: Sharlet Salina M.D.   On: 08/14/2020 17:19   CT Head Wo Contrast  Result Date: 08/14/2020 CLINICAL DATA:  Larey Seat, hit head EXAM: CT HEAD WITHOUT CONTRAST TECHNIQUE: Contiguous axial images were obtained from the base of the skull through the vertex without intravenous contrast. COMPARISON:  11/01/2019 FINDINGS: Brain: Confluent hypodensities throughout the periventricular white matter, in similar distribution to previous MRI findings. No signs of acute infarct or hemorrhage. The lateral ventricles and remaining midline structures are unremarkable. No acute extra-axial fluid collections. No mass effect. Vascular: No hyperdense vessel or unexpected calcification. Skull: Normal. Negative for fracture or focal lesion. Sinuses/Orbits: Mild mucoperiosteal thickening throughout the maxillary and ethmoid sinuses. Small mucous retention cyst or polyp within the left maxillary sinus. Other: None. IMPRESSION: 1. No acute intracranial process. 2. Extensive hypodensities throughout the periventricular white matter, stable since previous MRI. Findings most consistent with chronic small vessel ischemic  changes. Electronically Signed   By: Sharlet Salina M.D.   On: 08/14/2020 18:19   DG Knee Complete 4 Views Left  Result Date: 08/14/2020 CLINICAL DATA:  Larey Seat EXAM: LEFT TIBIA AND FIBULA - 2 VIEW; LEFT KNEE - COMPLETE 4+ VIEW COMPARISON:  None. FINDINGS: Left knee: Frontal, bilateral oblique, and lateral views of the left knee are obtained. There is 3 compartmental osteoarthritis, moderate in severity and greatest in the patellofemoral compartment. No fracture, subluxation, or dislocation. Soft tissues are unremarkable. No joint effusion. Left tibia/fibula: Frontal and lateral views demonstrate no fractures. Alignment is anatomic. Soft tissues are normal. IMPRESSION: 1. Moderate 3 compartmental left knee osteoarthritis. 2. No acute bony abnormalities of the left knee or left lower leg. Electronically Signed   By: Sharlet Salina M.D.   On: 08/14/2020 17:19   DG Hip Unilat W or Wo Pelvis 2-3 Views Left  Result Date: 08/14/2020 CLINICAL DATA:  Status post fall. EXAM: DG HIP (WITH OR WITHOUT PELVIS) 2-3V LEFT COMPARISON:  None FINDINGS: There is no evidence of hip fracture or dislocation. Mild bilateral hip joint osteoarthritis with joint space narrowing, marginal spur formation and subchondral sclerosis. IMPRESSION: 1. No acute findings. 2. Mild bilateral hip joint osteoarthritis. Electronically Signed   By: Signa Kell M.D.   On: 08/14/2020 17:37    Procedures Procedures (including critical care time)  Medications Ordered in ED Medications  fentaNYL (SUBLIMAZE) injection 50 mcg (50 mcg Intravenous Given 08/14/20 1821)    ED Course  I have reviewed the triage vital signs and the nursing notes.  Pertinent labs & imaging results that were available during my care of the patient were reviewed by me and considered in my medical decision making (see chart for details).    MDM Rules/Calculators/A&P                          64 y/o M with mechanical fall PTA at his job. C/o left knee pain. No  reported head trauma.   Reviewed/interpreted imaging CT head with chronic microvascular changes, otherwise no acute trauamtic injury Xray pelvis neg for fx Xray left knee  neg for fx or acute traumatic injury Xray left tib/fib neg for acute traumatic injury  Pt was placed in a knee immobilizer and given crutches. He was able to ambulate with these in the department. I recommend that he f/u with ortho, he has previously followed with [facility] if he has any continued pain. Advised on return precautions. He voices understanding of the plan and reasons to return. All questions answered, pt stable for discharge.   Case discussed with Dr. Rosalia Hammersay who is in agreement with plan.   Final Clinical Impression(s) / ED Diagnoses Final diagnoses:  Fall, initial encounter  Acute pain of left knee    Rx / DC Orders ED Discharge Orders         Ordered    naproxen (NAPROSYN) 375 MG tablet  2 times daily        08/14/20 1838    acetaminophen (TYLENOL) 325 MG tablet  Every 6 hours        08/14/20 1838           Rayne DuCouture, Venus Ruhe S, PA-C 08/14/20 1843    Margarita Grizzleay, Danielle, MD 08/22/20 [phone]

## 2020-08-14 NOTE — Progress Notes (Signed)
Orthopedic Tech Progress Note Patient Details:  Roger [name] 05-25-1956 [id number]  Ortho Devices Type of Ortho Device: Crutches,Knee Immobilizer Ortho Device/Splint Location: Left Lower Extremity Ortho Device/Splint Interventions: Ordered,Application,Adjustment   Post Interventions Patient Tolerated: Well Instructions Provided: Adjustment of device,Care of device,Poper ambulation with device   Roger [name] Harle Stanford 08/14/2020, 7:09 PM

## 2020-08-14 NOTE — ED Triage Notes (Signed)
Pt presents via EMS from [place], his place of work, where cases of water were stacked on some pallets being moved. Cases of water fell off and hit patient, but as he was moving out of the way of the falling water he twisted his L leg, fell to the ground, and now has L knee and L thigh pain. Did not pass out, no blood thinners.

## 2020-08-14 NOTE — Discharge Instructions (Signed)
You may alternate taking Tylenol and Naproxen as needed for pain control. You may take Naproxen twice daily as directed on your discharge paperwork and you may take  500-1000 mg of Tylenol every 6 hours. Do not exceed 4000 mg of Tylenol daily as this can lead to liver damage. Also, make sure to take Naproxen with meals as it can cause an upset stomach. Do not take other NSAIDs while taking Naproxen such as (Aleve, Ibuprofen, Aspirin, Celebrex, etc) and do not take more than the prescribed dose as this can lead to ulcers and bleeding in your GI tract. You may use warm and cold compresses to help with your symptoms.   Please follow up with the orthopedic doctor within the next 7-10 days for re-evaluation and further treatment of your symptoms.   Please return to the ER sooner if you have any new or worsening symptoms.  

## 2020-12-22 ENCOUNTER — Ambulatory Visit
Admission: EM | Admit: 2020-12-22 | Discharge: 2020-12-22 | Disposition: A | Discharge status: Home / Self Care | Payer: BC Managed Care – PPO | Attending: Emergency Medicine | Admitting: Emergency Medicine

## 2020-12-22 DIAGNOSIS — R059 Cough, unspecified: Secondary | ICD-10-CM | POA: Diagnosis not present

## 2020-12-22 DIAGNOSIS — Z20822 Contact with and (suspected) exposure to covid-19: Secondary | ICD-10-CM

## 2020-12-22 DIAGNOSIS — J302 Other seasonal allergic rhinitis: Secondary | ICD-10-CM

## 2020-12-22 DIAGNOSIS — R030 Elevated blood-pressure reading, without diagnosis of hypertension: Secondary | ICD-10-CM

## 2020-12-22 MED ORDER — BENZONATATE 100 MG PO CAPS: 100.0000 mg | ORAL_CAPSULE | Freq: Three times a day (TID) | ORAL | 0 refills | Status: DC | PRN

## 2020-12-22 NOTE — ED Triage Notes (Signed)
Pt presents with complaints of congestion and cough x 1 week. Reports initial concern was for allergies but now he is worried about bronchitis due to history. Pt also endorses fatigue. Pt had negative at home covid test yesterday. Reports temperature at home was 99 but he did wake up sweating one morning.

## 2020-12-22 NOTE — ED Provider Notes (Signed)
Roger [name]    CSN: [id number] Arrival date & time: 12/22/20  1527      History   Chief Complaint Chief Complaint  Patient presents with  . Cough    HPI Roger [name] is a 65 y.o. male.   Patient presents with nasal congestion and nonproductive cough x1 week.  He woke up sweating 1 morning earlier this week and had temp of 99.  He denies chills, rash, shortness of breath, wheezing, or other symptoms.  Treatment attempted at home with OTC allergy medication.  He had a negative COVID test at home.  Patient's medical history includes CAD, hyperlipidemia, obesity, elevated blood pressure reading, Bell's palsy, hypothyroidism, COVID-19 in 2021.  The history is provided by the patient and medical records.    Past Medical History:  Diagnosis Date  . Bell's palsy x2   recurrent  . CAD (coronary artery disease) 2004   with stent, unclear details  . Cataract    OD>OS per notes from [facility]  . COVID-19 virus infection 03/2020  . 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   . History of chicken pox   . History of hypothyroidism as a child  . HLD (hyperlipidemia)   . Thyroid disease     Patient Active Problem List   Diagnosis Date Noted  . Allergic reaction to insect bite 07/05/2020  . Nocturnal leg cramps 02/22/2020  . 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 Diplopia 09/27/2019  . Nausea 08/16/2019  . Health maintenance examination 09/02/2016  . Left-sided low back pain without sciatica 01/23/2016  . Obesity, Class I, BMI 30-34.9 03/09/2015  . Elevated blood pressure reading without diagnosis of hypertension 03/09/2015  . CAD (coronary artery disease)   . HLD (hyperlipidemia)     Past Surgical History:  Procedure Laterality Date  . COLONOSCOPY     normal per patient  . PERCUTANEOUS CORONARY STENT INTERVENTION (PCI-S)  11/08/02   [place]  . TONSILLECTOMY    . TONSILLECTOMY AND ADENOIDECTOMY Bilateral        Home Medications    Prior  to Admission medications   Medication Sig Start Date End Date Taking? Authorizing Provider  benzonatate (TESSALON) 100 MG capsule Take 1 capsule (100 mg total) by mouth 3 (three) times daily as needed for cough. 12/22/20  Yes Mickie Bail, NP  ALPRAZolam Prudy Feeler) 0.25 MG tablet Take 1-2 tabs (0.25mg -0.50mg ) 30-60 minutes before procedure. May repeat if needed before or during the exam every 30 minutes as needed Do not drive. 12/04/19   Anson Fret, MD  aspirin (ASPIRIN EC) 81 MG EC tablet Take 81 mg by mouth daily. Swallow whole.    [provider]  atorvastatin (LIPITOR) 20 MG tablet Take 1 tablet (20 mg total) by mouth daily. 08/16/19   Eustaquio Boyden, MD  EPINEPHrine 0.3 mg/0.3 mL IJ SOAJ injection Inject 0.3 mg into the muscle as needed for anaphylaxis. 07/04/20   Merwyn Katos, MD  Multiple Vitamins-Minerals (MULTIVITAMIN ADULT PO) Take 1 tablet by mouth daily.    [provider]  omeprazole (PRILOSEC) 40 MG capsule Take 1 capsule (40 mg total) by mouth daily. 08/16/19   Eustaquio Boyden, MD    Family History Family History  Problem Relation Age of Onset  . Cancer Mother 42       colon  . Cancer Father 30       prostate  . CAD Father 99  massive MI  . CAD Paternal Uncle   . Alcoholism Paternal Uncle   . Stroke Paternal Uncle   . Diabetes Other        maternal side    Social History Social History   Tobacco Use  . Smoking status: Former Smoker    Quit date: 08/26/1982    Years since quitting: 38.3  . Smokeless tobacco: Never Used  Vaping Use  . Vaping Use: Never used  Substance Use Topics  . Alcohol use: No    Alcohol/week: 0.0 standard drinks  . Drug use: No     Allergies   Patient has no known allergies.   Review of Systems Review of Systems  Constitutional: Negative for chills and fever.  HENT: Positive for congestion. Negative for ear pain and sore throat.   Respiratory: Positive for cough. Negative for shortness of breath.    Cardiovascular: Negative for chest pain and palpitations.  Gastrointestinal: Negative for abdominal pain and vomiting.  Skin: Negative for color change and rash.  All other systems reviewed and are negative.    Physical Exam Triage Vital Signs ED Triage Vitals  Enc Vitals Group     BP      Pulse      Resp      Temp      Temp src      SpO2      Weight      Height      Head Circumference      Peak Flow      Pain Score      Pain Loc      Pain Edu?      Excl. in GC?    No data found.  Updated Vital Signs BP (!) 153/77   Pulse 86   Temp 98 F (36.7 C)   Resp 19   SpO2 97%   Visual Acuity Right Eye Distance:   Left Eye Distance:   Bilateral Distance:    Right Eye Near:   Left Eye Near:    Bilateral Near:     Physical Exam   UC Treatments / Results  Labs (all labs ordered are listed, but only abnormal results are displayed) Labs Reviewed  NOVEL CORONAVIRUS, NAA    EKG   Radiology No results found.  Procedures Procedures (including critical care time)  Medications Ordered in UC Medications - No data to display  Initial Impression / Assessment and Plan / UC Course  I have reviewed the triage vital signs and the nursing notes.  Pertinent labs & imaging results that were available during my care of the patient were reviewed by me and considered in my medical decision making (see chart for details).   Cough, seasonal allergies, COVID-19 test.  Elevated blood pressure reading.  Treating cough with Tessalon.  PCR COVID pending and patient instructed to self quarantine until the test result is back.  Discussed that he should follow-up with his PCP if his symptoms or not improving.  Also discussed that his blood pressure is elevated today and needs to be rechecked by his PCP in 2 to 4 weeks.  He agrees to plan of care.   Final Clinical Impressions(s) / UC Diagnoses   Final diagnoses:  Encounter for screening laboratory testing for COVID-19 virus  Cough   Seasonal allergies  Elevated blood pressure reading     Discharge Instructions     Take the Tessalon as needed for cough.  Your COVID test is pending.  You should  self quarantine until the test result is back.    Follow up with your primary care provider if your symptoms are not improving.    Your blood pressure is elevated today at 153/77.  Please have this rechecked by your primary care provider in 2-4 weeks.          ED Prescriptions    Medication Sig Dispense Auth. Provider   benzonatate (TESSALON) 100 MG capsule Take 1 capsule (100 mg total) by mouth 3 (three) times daily as needed for cough. 21 capsule Mickie Bail, NP     PDMP not reviewed this encounter.   Mickie Bail, NP 12/22/20 1600

## 2020-12-22 NOTE — Discharge Instructions (Signed)
Take the Tessalon as needed for cough.  Your COVID test is pending.  You should self quarantine until the test result is back.    Follow up with your primary care provider if your symptoms are not improving.    Your blood pressure is elevated today at 153/77.  Please have this rechecked by your primary care provider in 2-4 weeks.

## 2020-12-23 LAB — SARS-COV-2, NAA 2 DAY TAT

## 2020-12-23 LAB — NOVEL CORONAVIRUS, NAA: SARS-CoV-2, NAA: NOT DETECTED

## 2021-02-07 IMAGING — CR DG ORBITS FOR FOREIGN BODY
2 series · 2 of 2 positions shown · non-contrast
Comparison: None.

CLINICAL DATA: Metal working/exposure; clearance prior to MRI

EXAM:
ORBITS FOR FOREIGN BODY - 2 VIEW

[w orbit pa (1 of 2)]
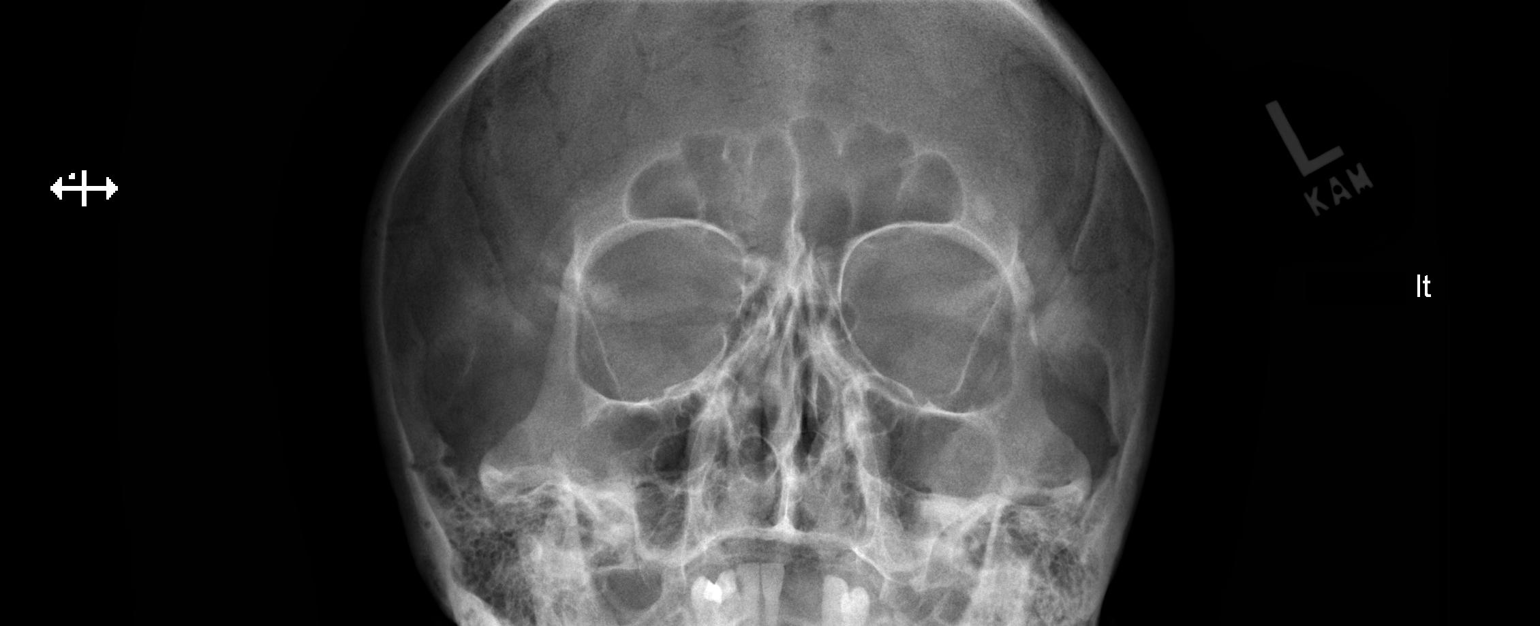

[w orbit pa (2 of 2)]
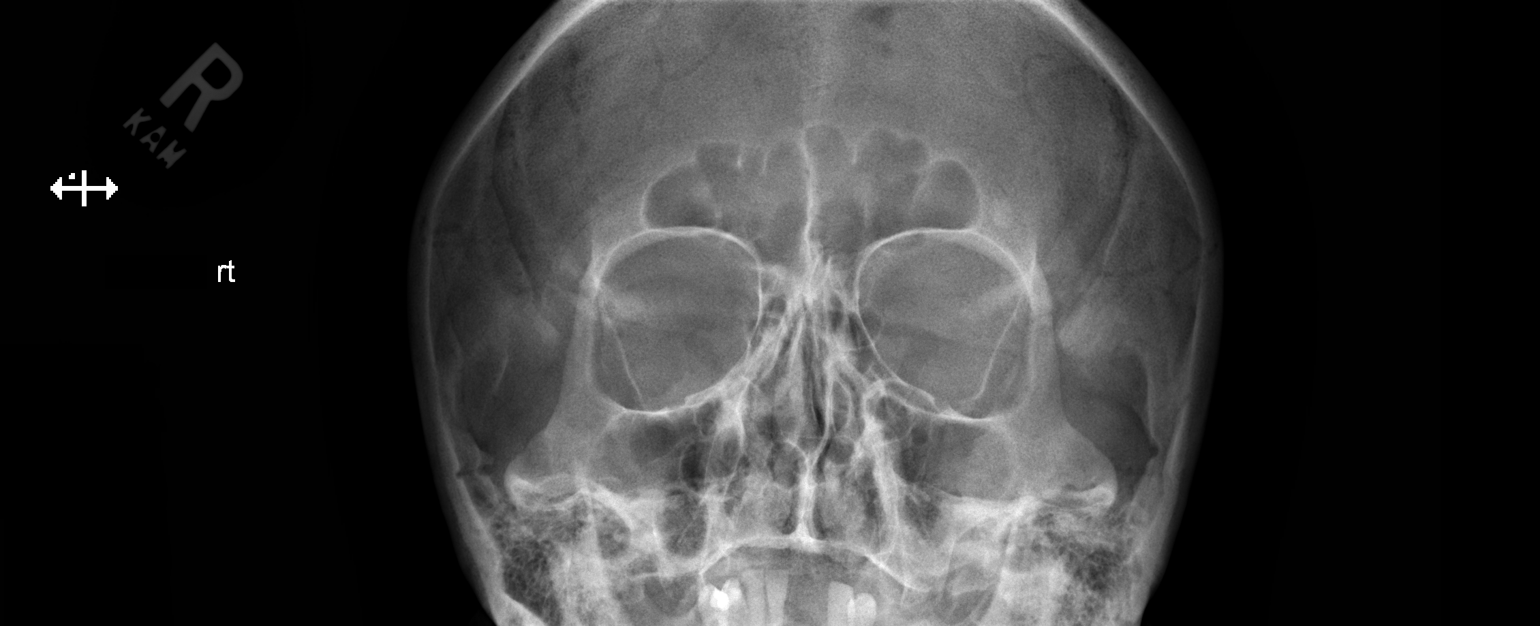

[2 of 2 positions shown; findings below may reference images not displayed]

FINDINGS: There is no evidence of metallic foreign body within the orbits. No
significant bone abnormality identified.
IMPRESSION: No evidence of metallic foreign body within the orbits.

## 2021-05-22 ENCOUNTER — Ambulatory Visit: Payer: Medicare Other | Admitting: Family Medicine

## 2021-11-21 IMAGING — DX DG TIBIA/FIBULA 2V*L*
2 series · 2 of 2 positions shown · non-contrast
Comparison: None.

CLINICAL DATA: Fell

EXAM:
LEFT TIBIA AND FIBULA - 2 VIEW; LEFT KNEE - COMPLETE 4+ VIEW

[tibia ap]
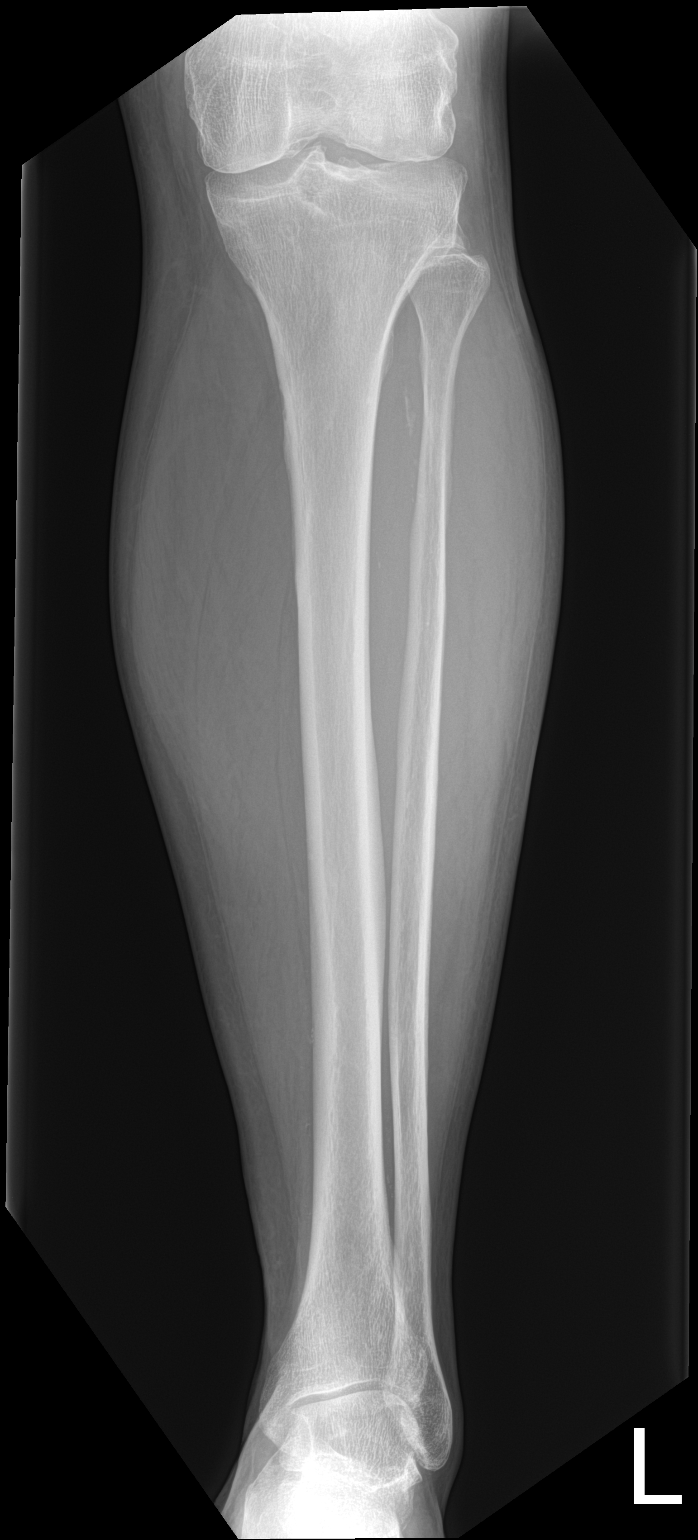

[tibia lat]
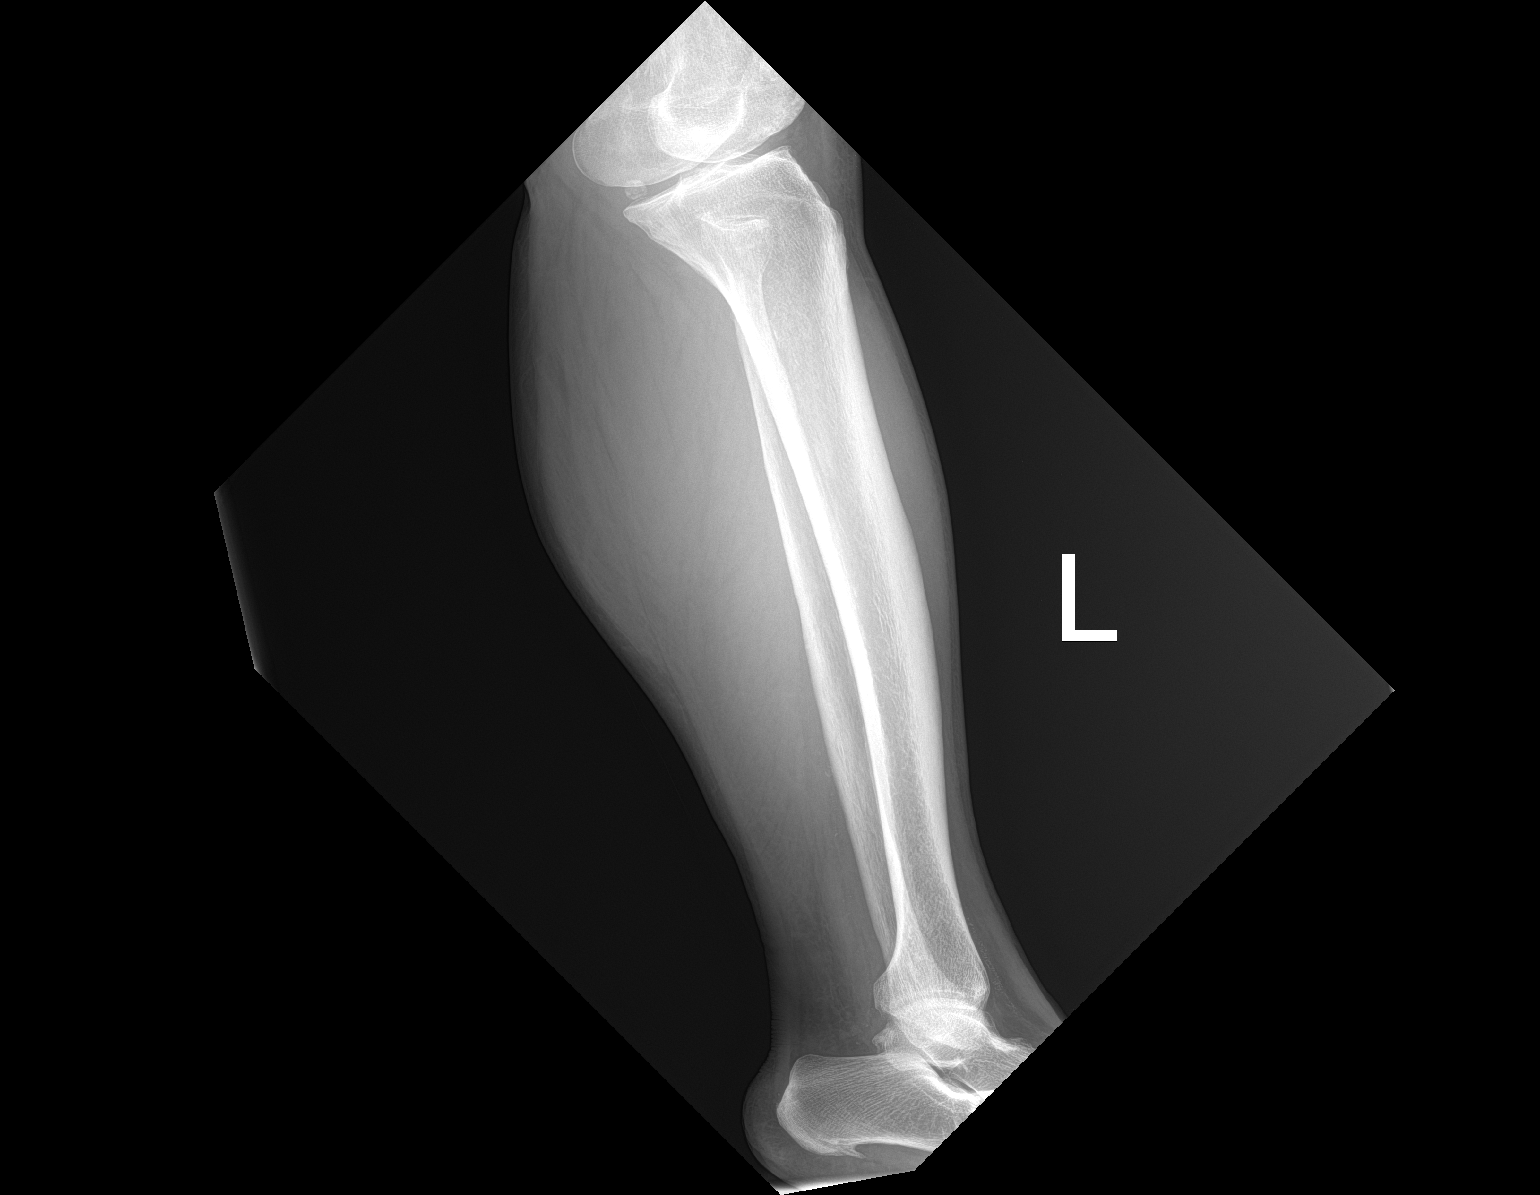

[2 of 2 positions shown; findings below may reference images not displayed]

FINDINGS: Left knee: Frontal, bilateral oblique, and lateral views of the left
knee are obtained. There is 3 compartmental osteoarthritis, moderate
in severity and greatest in the patellofemoral compartment. No
fracture, subluxation, or dislocation. Soft tissues are
unremarkable. No joint effusion.

Left tibia/fibula: Frontal and lateral views demonstrate no
fractures. Alignment is anatomic. Soft tissues are normal.
IMPRESSION: 1. Moderate 3 compartmental left knee osteoarthritis.
2. No acute bony abnormalities of the left knee or left lower leg.

## 2022-10-18 ENCOUNTER — Encounter: Payer: Self-pay | Admitting: Nurse Practitioner

## 2022-10-18 ENCOUNTER — Ambulatory Visit (INDEPENDENT_AMBULATORY_CARE_PROVIDER_SITE_OTHER): Payer: No Typology Code available for payment source | Admitting: Nurse Practitioner

## 2022-10-18 ENCOUNTER — Ambulatory Visit
Admission: RE | Admit: 2022-10-18 | Discharge: 2022-10-18 | Disposition: A | Discharge status: Home / Self Care | Payer: No Typology Code available for payment source | Source: Ambulatory Visit | Attending: Nurse Practitioner | Admitting: Nurse Practitioner

## 2022-10-18 VITALS — BP 138/78 | HR 78 | Temp 99.6°F | Resp 16 | Ht 65.0 in | Wt 177.5 lb

## 2022-10-18 DIAGNOSIS — G4459 Other complicated headache syndrome: Secondary | ICD-10-CM | POA: Diagnosis not present

## 2022-10-18 DIAGNOSIS — R29810 Facial weakness: Secondary | ICD-10-CM | POA: Diagnosis present

## 2022-10-18 DIAGNOSIS — G510 Bell's palsy: Secondary | ICD-10-CM

## 2022-10-18 DIAGNOSIS — R519 Headache, unspecified: Secondary | ICD-10-CM | POA: Diagnosis not present

## 2022-10-18 LAB — COMPREHENSIVE METABOLIC PANEL
ALT: 15 U/L (ref 0–53)
AST: 13 U/L (ref 0–37)
Albumin: 4.5 g/dL (ref 3.5–5.2)
Alkaline Phosphatase: 113 U/L (ref 39–117)
BUN: 13 mg/dL (ref 6–23)
CO2: 28 mEq/L (ref 19–32)
Calcium: 9.5 mg/dL (ref 8.4–10.5)
Chloride: 105 mEq/L (ref 96–112)
Creatinine, Ser: 0.96 mg/dL (ref 0.40–1.50)
GFR: 81.34 mL/min (ref >60.00)
Glucose, Bld: 78 mg/dL (ref 70–99)
Potassium: 4.2 mEq/L (ref 3.5–5.1)
Sodium: 142 mEq/L (ref 135–145)
Total Bilirubin: 0.3 mg/dL (ref 0.2–1.2)
Total Protein: 7.7 g/dL (ref 6.0–8.3)

## 2022-10-18 LAB — CBC
HCT: 42.8 % (ref 39.0–52.0)
Hemoglobin: 14.6 g/dL (ref 13.0–17.0)
MCHC: 34.1 g/dL (ref 30.0–36.0)
MCV: 91.1 fl (ref 78.0–100.0)
Platelets: 247 10*3/uL (ref 150.0–400.0)
RBC: 4.7 Mil/uL (ref 4.22–5.81)
RDW: 14.4 % (ref 11.5–15.5)
WBC: 9.1 10*3/uL (ref 4.0–10.5)

## 2022-10-18 LAB — HIGH SENSITIVITY CRP: CRP, High Sensitivity: 2.18 mg/L (ref 0.000–5.000)

## 2022-10-18 LAB — SEDIMENTATION RATE: Sed Rate: 22 mm/hr — ABNORMAL HIGH (ref 0–20)

## 2022-10-18 MED ORDER — PREDNISONE 20 MG PO TABS: ORAL_TABLET | ORAL | 0 refills | Status: AC

## 2022-10-18 NOTE — Assessment & Plan Note (Signed)
Concern for CVA. VS facial nerve palsy. Stat CT scan of the head

## 2022-10-18 NOTE — Assessment & Plan Note (Signed)
Concern for CVA vs nerve palsy, vs temporal arteritis on top. Treat with steroids. Stat CT scan of the head

## 2022-10-18 NOTE — Progress Notes (Signed)
Acute Office Visit  Subjective:     Patient ID: Roger [name], male    DOB: [birth year], 67 y.o.   MRN: [id number]  Chief Complaint  Patient presents with   bells palsey    HPI Patient is in today for Bells Palsy. He has the history of the same and has been evaluated in the past by Dr. Jaynee Eagles in the past. He also has a history of CAD, obesity, HLD, diplopia  Symptoms started on Monday.  States that he has been having an ear ahce. States that then several days later he woke up with the palsy. States some pain on the face and no were else  States the headache has been since Monday States that she has been taking ibuprofen that has helped some States it is intermittent stabbing pain and is approx 30 mins ago  Review of Systems  Constitutional:  Negative for chills and fever.  Respiratory:  Negative for shortness of breath.   Cardiovascular:  Negative for chest pain.  Neurological:  Positive for tingling, speech change and headaches.  Psychiatric/Behavioral:  Negative for hallucinations and suicidal ideas.         Objective:    BP 138/78   Pulse 78   Temp 99.6 F (37.6 C)   Resp 16   Ht '5\' 5"'$  (1.651 m)   Wt 177 lb 8 oz (80.5 kg)   SpO2 98%   BMI 29.54 kg/m    Physical Exam Vitals and nursing note reviewed.  Constitutional:      Appearance: Normal appearance.  HENT:     Right Ear: Tympanic membrane, ear canal and external ear normal.     Left Ear: Tympanic membrane, ear canal and external ear normal.  Eyes:     Extraocular Movements: Extraocular movements intact.     Pupils: Pupils are equal, round, and reactive to light.     Comments: Ptosis present   Cardiovascular:     Rate and Rhythm: Normal rate and regular rhythm.     Heart sounds: Normal heart sounds.  Pulmonary:     Effort: Pulmonary effort is normal.     Breath sounds: Normal breath sounds.  Neurological:     Mental Status: He is alert.     Cranial Nerves: Facial asymmetry present.     Sensory:  Sensation is intact.     Motor: Motor function is intact.     Coordination: Coordination is intact.     Gait: Gait is intact.     Deep Tendon Reflexes:     Reflex Scores:      Bicep reflexes are 1+ on the right side and 1+ on the left side.      Patellar reflexes are 2+ on the right side and 2+ on the left side.    Comments: Bilateral upper and lower extremity strength 5/5 Left sided facial droop.  Oropharynx symmetrical  Unable to wrinkle left side of forehead      No results found for any visits on 10/18/22.      Assessment & Plan:   Problem List Items Addressed This Visit       Nervous and Auditory   Bell's palsy    History of the same in the past. Has been evaluated by Neurology before. Concerning this time as patient has a headache that is non intractable. R/o CVA vs Nerve palsy. High dose steroid taper, eye lubrication and protection       Relevant Medications   predniSONE (DELTASONE) 20 MG  tablet   Other Relevant Orders   CBC   Comprehensive metabolic panel   Sedimentation rate   High sensitivity CRP   CT HEAD WO CONTRAST (5MM)     Other   Facial droop - Primary    Concern for CVA. VS facial nerve palsy. Stat CT scan of the head      Relevant Orders   CT HEAD WO CONTRAST (5MM)   Other complicated headache syndrome    Concern for CVA vs nerve palsy, vs temporal arteritis on top. Treat with steroids. Stat CT scan of the head      Relevant Orders   CBC   Comprehensive metabolic panel   Sedimentation rate   High sensitivity CRP   CT HEAD WO CONTRAST (5MM)    Meds ordered this encounter  Medications   predniSONE (DELTASONE) 20 MG tablet    Sig: Take 3 tablets (60 mg total) by mouth daily with breakfast for 3 days, THEN 2 tablets (40 mg total) daily with breakfast for 3 days, THEN 1 tablet (20 mg total) daily with breakfast for 3 days. Avoid NSAIDs like Ibuprofen, motrin, aleve, naproxen, BC/Goody powders.    Dispense:  18 tablet    Refill:  0    Order  Specific Question:   Supervising Provider    Answer:   TOWER, MARNE A [[id number]]    Return if symptoms worsen or fail to improve.  Romilda Garret, NP

## 2022-10-18 NOTE — Patient Instructions (Signed)
Nice to see you today I will be in touch with the labs and CT scan results once I have them I have sent in some prednisone to take. Take it with food. Avoid NSAID like: ibuprofen, aleve, naproxen, motrin, BC/goody powders. Tylenol is ok to for pain relief   Get a lubricating eye drop at the store use it through the day. Also do it before bed then tape your eye closed to protect it at night

## 2022-10-18 NOTE — Assessment & Plan Note (Addendum)
History of the same in the past. Has been evaluated by Neurology before. Concerning this time as patient has a headache that is non intractable. R/o CVA vs Nerve palsy. High dose steroid taper, eye lubrication and protection

## 2023-05-17 ENCOUNTER — Other Ambulatory Visit: Payer: Self-pay | Admitting: Family Medicine

## 2023-05-17 DIAGNOSIS — Z125 Encounter for screening for malignant neoplasm of prostate: Secondary | ICD-10-CM

## 2023-05-17 DIAGNOSIS — E785 Hyperlipidemia, unspecified: Secondary | ICD-10-CM

## 2023-05-23 ENCOUNTER — Other Ambulatory Visit (INDEPENDENT_AMBULATORY_CARE_PROVIDER_SITE_OTHER): Payer: No Typology Code available for payment source

## 2023-05-23 DIAGNOSIS — Z125 Encounter for screening for malignant neoplasm of prostate: Secondary | ICD-10-CM | POA: Diagnosis not present

## 2023-05-23 DIAGNOSIS — E785 Hyperlipidemia, unspecified: Secondary | ICD-10-CM | POA: Diagnosis not present

## 2023-05-23 LAB — LIPID PANEL
Cholesterol: 186 mg/dL (ref 0–200)
HDL: 39.4 mg/dL (ref >39.00)
LDL Cholesterol: 112 mg/dL — ABNORMAL HIGH (ref 0–99)
NonHDL: 146.96
Total CHOL/HDL Ratio: 5
Triglycerides: 173 mg/dL — ABNORMAL HIGH (ref 0.0–149.0)
VLDL: 34.6 mg/dL (ref 0.0–40.0)

## 2023-05-23 LAB — TSH: TSH: 7.07 u[IU]/mL — ABNORMAL HIGH (ref 0.35–5.50)

## 2023-05-23 LAB — COMPREHENSIVE METABOLIC PANEL
ALT: 15 U/L (ref 0–53)
AST: 15 U/L (ref 0–37)
Albumin: 4.3 g/dL (ref 3.5–5.2)
Alkaline Phosphatase: 96 U/L (ref 39–117)
BUN: 19 mg/dL (ref 6–23)
CO2: 22 meq/L (ref 19–32)
Calcium: 9.1 mg/dL (ref 8.4–10.5)
Chloride: 106 meq/L (ref 96–112)
Creatinine, Ser: 1.07 mg/dL (ref 0.40–1.50)
GFR: 71.12 mL/min (ref >60.00)
Glucose, Bld: 76 mg/dL (ref 70–99)
Potassium: 4.2 meq/L (ref 3.5–5.1)
Sodium: 139 meq/L (ref 135–145)
Total Bilirubin: 0.4 mg/dL (ref 0.2–1.2)
Total Protein: 7.4 g/dL (ref 6.0–8.3)

## 2023-05-23 LAB — PSA: PSA: 0.28 ng/mL (ref 0.10–4.00)

## 2023-05-30 ENCOUNTER — Ambulatory Visit: Payer: No Typology Code available for payment source | Admitting: Family Medicine

## 2023-05-30 ENCOUNTER — Encounter: Payer: Self-pay | Admitting: Family Medicine

## 2023-05-30 VITALS — BP 136/82 | HR 74 | Temp 97.8°F | Ht 63.25 in | Wt 173.5 lb

## 2023-05-30 DIAGNOSIS — E785 Hyperlipidemia, unspecified: Secondary | ICD-10-CM

## 2023-05-30 DIAGNOSIS — E66811 Obesity, class 1: Secondary | ICD-10-CM | POA: Diagnosis not present

## 2023-05-30 DIAGNOSIS — Z1211 Encounter for screening for malignant neoplasm of colon: Secondary | ICD-10-CM

## 2023-05-30 DIAGNOSIS — K219 Gastro-esophageal reflux disease without esophagitis: Secondary | ICD-10-CM

## 2023-05-30 DIAGNOSIS — Z7189 Other specified counseling: Secondary | ICD-10-CM | POA: Diagnosis not present

## 2023-05-30 DIAGNOSIS — Z23 Encounter for immunization: Secondary | ICD-10-CM | POA: Diagnosis not present

## 2023-05-30 DIAGNOSIS — E039 Hypothyroidism, unspecified: Secondary | ICD-10-CM

## 2023-05-30 DIAGNOSIS — I2510 Atherosclerotic heart disease of native coronary artery without angina pectoris: Secondary | ICD-10-CM

## 2023-05-30 DIAGNOSIS — Z0000 Encounter for general adult medical examination without abnormal findings: Secondary | ICD-10-CM

## 2023-05-30 MED ORDER — ATORVASTATIN CALCIUM 20 MG PO TABS: 20.0000 mg | ORAL_TABLET | Freq: Every day | ORAL | 3 refills | Status: DC

## 2023-05-30 MED ORDER — LEVOTHYROXINE SODIUM 50 MCG PO TABS: 50.0000 ug | ORAL_TABLET | Freq: Every day | ORAL | 3 refills | Status: DC

## 2023-05-30 MED ORDER — FAMOTIDINE 20 MG PO TABS: 20.0000 mg | ORAL_TABLET | Freq: Every day | ORAL | Status: DC

## 2023-05-30 MED ORDER — ASPIRIN 81 MG PO TBEC: 81.0000 mg | DELAYED_RELEASE_TABLET | Freq: Every day | ORAL | Status: AC

## 2023-05-30 MED ORDER — OMEPRAZOLE 40 MG PO CPDR: 40.0000 mg | DELAYED_RELEASE_CAPSULE | Freq: Every day | ORAL | 3 refills | Status: DC

## 2023-05-30 NOTE — Assessment & Plan Note (Addendum)
H/o stent, no records available - recommend restart aspirin, statin.

## 2023-05-30 NOTE — Assessment & Plan Note (Signed)
Encourage healthy diet and lifestyle choices to affect sustainable weight loss.  

## 2023-05-30 NOTE — Assessment & Plan Note (Addendum)
No red flags. Rec trial pepcid 20mg  nightly.  If ineffective, may try omeprazole 40mg  daily PRN.

## 2023-05-30 NOTE — Assessment & Plan Note (Signed)
Advanced directive - discussed. Has not set this up but would want wife and daughter to be HCPOA. Full code, but wouldn't want prolonged life support if terminal condition.

## 2023-05-30 NOTE — Progress Notes (Signed)
Ph: [phone] Fax: [phone]   Patient ID: Roger [name], male    DOB: 1956-05-12, 67 y.o.   MRN: [id number]  This visit was conducted in person.  BP 136/82   Pulse 74   Temp 97.8 F (36.6 C) (Oral)   Ht 5' 3.25" (1.607 m)   Wt 173 lb 8 oz (78.7 kg)   SpO2 96%   BMI 30.49 kg/m    CC: CPE Subjective:   HPI: Roger [name] is a 67 y.o. male presenting on 05/30/2023 for Medicare Wellness   I last saw patient 2021.  He received medicare A/B at age 10yo.   Notes increased fatigue, constipation, cold intolerance. No unexpected weight loss or gain.   Notes ongoing upper abd discomfort. Notes heartburn, indigestion, acid reflux. Previously on omeprazole with benefit. No dysphagia, nausea/vomiting, or early satiety or weight loss.   Preventative: COLONOSCOPY normal per patient in [place] but unsure where or when. Referred for repeat colonoscopy 2021 - did not complete. Agrees to repeat  Prostate cancer screen - yearly PSA. No nocturia.  Lung cancer screening - not eligible  Flu - declines  COVID vaccine - completed Pfizer 10/2019 Tdap 2021, 07/2022 Prevnar-20 - today Shingrix - to consider  Advanced directive - discussed. Has not set this up but would want wife and daughter to be HCPOA. Full code, but wouldn't want prolonged life support if terminal condition.  Seat belt use discussed Sunscreen use discussed. No changing moles on skin.  Ex smoker - quit remotely Alcohol - none Dentist has seen  Eye exam yearly - monitoring cataract   Lives with wife, daughter with granddaughter Edu: some college Occupation: Therapist, art Activity: stays active at work, 20,000 steps daily  Diet: poor water, good fruits/vegetables daily      Relevant past medical, surgical, family and social history reviewed and updated as indicated. Interim medical history since our last visit reviewed. Allergies and medications reviewed and updated. Outpatient Medications Prior  to Visit  Medication Sig Dispense Refill   EPINEPHrine 0.3 mg/0.3 mL IJ SOAJ injection Inject 0.3 mg into the muscle as needed for anaphylaxis. 2 each 0   ALPRAZolam (XANAX) 0.25 MG tablet Take 1-2 tabs (0.25mg -0.50mg ) 30-60 minutes before procedure. May repeat if needed before or during the exam every 30 minutes as needed Do not drive. (Patient not taking: Reported on 10/18/2022) 6 tablet 0   aspirin (ASPIRIN EC) 81 MG EC tablet Take 81 mg by mouth daily. Swallow whole. (Patient not taking: Reported on 10/18/2022)     atorvastatin (LIPITOR) 20 MG tablet Take 1 tablet (20 mg total) by mouth daily. (Patient not taking: Reported on 10/18/2022) 90 tablet 3   benzonatate (TESSALON) 100 MG capsule Take 1 capsule (100 mg total) by mouth 3 (three) times daily as needed for cough. 21 capsule 0   Multiple Vitamins-Minerals (MULTIVITAMIN ADULT PO) Take 1 tablet by mouth daily. (Patient not taking: Reported on 10/18/2022)     omeprazole (PRILOSEC) 40 MG capsule Take 1 capsule (40 mg total) by mouth daily. (Patient not taking: Reported on 10/18/2022) 30 capsule 1   No facility-administered medications prior to visit.     Per HPI unless specifically indicated in ROS section below Review of Systems  Constitutional:  Positive for fatigue. Negative for activity change, appetite change, chills, fever and unexpected weight change.  HENT:  Negative for hearing loss.   Eyes:  Negative for visual disturbance.  Respiratory:  Positive for shortness of breath (occ exertional). Negative for  cough, chest tightness and wheezing.   Cardiovascular:  Negative for chest pain, palpitations and leg swelling.  Gastrointestinal:  Positive for constipation. Negative for abdominal distention, abdominal pain, blood in stool, diarrhea, nausea and vomiting.  Endocrine: Positive for cold intolerance.  Genitourinary:  Negative for difficulty urinating and hematuria.  Musculoskeletal:  Negative for arthralgias, myalgias and neck pain.   Skin:  Negative for rash.  Neurological:  Negative for dizziness, seizures, syncope and headaches.  Hematological:  Negative for adenopathy. Bruises/bleeds easily.  Psychiatric/Behavioral:  Negative for dysphoric mood. The patient is not nervous/anxious.         Occ insomnia Cries easily    Objective:  BP 136/82   Pulse 74   Temp 97.8 F (36.6 C) (Oral)   Ht 5' 3.25" (1.607 m)   Wt 173 lb 8 oz (78.7 kg)   SpO2 96%   BMI 30.49 kg/m   Wt Readings from Last 3 Encounters:  05/30/23 173 lb 8 oz (78.7 kg)  10/18/22 177 lb 8 oz (80.5 kg)  08/14/20 176 lb (79.8 kg)      Physical Exam Vitals and nursing note reviewed.  Constitutional:      General: He is not in acute distress.    Appearance: Normal appearance. He is well-developed. He is not ill-appearing.  HENT:     Head: Normocephalic and atraumatic.     Right Ear: Hearing, tympanic membrane, ear canal and external ear normal.     Left Ear: Hearing, tympanic membrane, ear canal and external ear normal.     Mouth/Throat:     Mouth: Mucous membranes are moist.     Pharynx: Oropharynx is clear. No oropharyngeal exudate or posterior oropharyngeal erythema.  Eyes:     General: No scleral icterus.    Extraocular Movements: Extraocular movements intact.     Conjunctiva/sclera: Conjunctivae normal.     Pupils: Pupils are equal, round, and reactive to light.  Neck:     Thyroid: No thyroid mass or thyromegaly.     Vascular: No carotid bruit.  Cardiovascular:     Rate and Rhythm: Normal rate and regular rhythm.     Pulses: Normal pulses.          Radial pulses are 2+ on the right side and 2+ on the left side.     Heart sounds: Normal heart sounds. No murmur heard. Pulmonary:     Effort: Pulmonary effort is normal. No respiratory distress.     Breath sounds: Normal breath sounds. No wheezing, rhonchi or rales.  Abdominal:     General: Bowel sounds are normal. There is no distension.     Palpations: Abdomen is soft. There is no  mass.     Tenderness: There is no abdominal tenderness. There is no guarding or rebound.     Hernia: No hernia is present.  Musculoskeletal:        General: Normal range of motion.     Cervical back: Normal range of motion and neck supple.     Right lower leg: No edema.     Left lower leg: No edema.  Lymphadenopathy:     Cervical: No cervical adenopathy.  Skin:    General: Skin is warm and dry.     Findings: No rash.  Neurological:     General: No focal deficit present.     Mental Status: He is alert and oriented to person, place, and time.     Comments:  Recall 1/3, 3/3 with cue  Calculation 4/5  serial 3s, trouble with reverse spelling   Psychiatric:        Mood and Affect: Mood normal.        Behavior: Behavior normal.        Thought Content: Thought content normal.        Judgment: Judgment normal.       Results for orders placed or performed in visit on 05/23/23  TSH  Result Value Ref Range   TSH 7.07 (H) 0.35 - 5.50 uIU/mL  PSA  Result Value Ref Range   PSA 0.28 0.10 - 4.00 ng/mL  Comprehensive metabolic panel  Result Value Ref Range   Sodium 139 135 - 145 mEq/L   Potassium 4.2 3.5 - 5.1 mEq/L   Chloride 106 96 - 112 mEq/L   CO2 22 19 - 32 mEq/L   Glucose, Bld 76 70 - 99 mg/dL   BUN 19 6 - 23 mg/dL   Creatinine, Ser 5.18 0.40 - 1.50 mg/dL   Total Bilirubin 0.4 0.2 - 1.2 mg/dL   Alkaline Phosphatase 96 39 - 117 U/L   AST 15 0 - 37 U/L   ALT 15 0 - 53 U/L   Total Protein 7.4 6.0 - 8.3 g/dL   Albumin 4.3 3.5 - 5.2 g/dL   GFR 84.16 >60.63 mL/min   Calcium 9.1 8.4 - 10.5 mg/dL  Lipid panel  Result Value Ref Range   Cholesterol 186 0 - 200 mg/dL   Triglycerides 016.0 (H) 0.0 - 149.0 mg/dL   HDL 10.93 >23.55 mg/dL   VLDL 73.2 0.0 - 20.2 mg/dL   LDL Cholesterol 542 (H) 0 - 99 mg/dL   Total CHOL/HDL Ratio 5    NonHDL 146.96     Assessment & Plan:   Problem List Items Addressed This Visit     Health maintenance examination (Chronic)    Preventative  protocols reviewed and updated unless pt declined. Discussed healthy diet and lifestyle.       Medicare annual wellness visit, initial - Primary (Chronic)    I have personally reviewed the Medicare Annual Wellness questionnaire and have noted 1. The patient's medical and social history 2. Their use of alcohol, tobacco or illicit drugs 3. Their current medications and supplements 4. The patient's functional ability including ADL's, fall risks, home safety risks and hearing or visual impairment. Cognitive function has been assessed and addressed as indicated.  5. Diet and physical activity 6. Evidence for depression or mood disorders The patients weight, height, BMI have been recorded in the chart. I have made referrals, counseling and provided education to the patient based on review of the above and I have provided the pt with a written personalized care plan for preventive services. Provider list updated.. See scanned questionairre as needed for further documentation. Reviewed preventative protocols and updated unless pt declined.       Advanced directives, counseling/discussion (Chronic)    Advanced directive - discussed. Has not set this up but would want wife and daughter to be HCPOA. Full code, but wouldn't want prolonged life support if terminal condition.       CAD (coronary artery disease)    H/o stent, no records available - recommend restart aspirin, statin.       Relevant Medications   aspirin EC 81 MG tablet   atorvastatin (LIPITOR) 20 MG tablet   HLD (hyperlipidemia)    Chronic, h/o CAD. Restart atorvastatin, aspirin.  Goal LDL <70 in CAD/stent history The 10-year ASCVD risk score (Arnett DK, et al.,  2019) is: 18.7%   Values used to calculate the score:     Age: 61 years     Sex: Male     Is Non-Hispanic African American: No     Diabetic: No     Tobacco smoker: No     Systolic Blood Pressure: 136 mmHg     Is BP treated: No     HDL Cholesterol: 39.4 mg/dL      Total Cholesterol: 186 mg/dL       Relevant Medications   aspirin EC 81 MG tablet   atorvastatin (LIPITOR) 20 MG tablet   Obesity, Class I, BMI 30-34.9    Encourage healthy diet and lifestyle choices to affect sustainable weight loss.       Hypothyroidism (acquired)    TSH elevated with hypothyroid symptoms - start levothyroxine daily. RTC 6 wks rpt TFTs. RTC 6 mo f/u visit.       Relevant Medications   levothyroxine (SYNTHROID) 50 MCG tablet   Other Relevant Orders   TSH   T4, free   T3   GERD (gastroesophageal reflux disease)    No red flags. Rec trial pepcid 20mg  nightly.  If ineffective, may try omeprazole 40mg  daily PRN.       Relevant Medications   omeprazole (PRILOSEC) 40 MG capsule   famotidine (PEPCID) 20 MG tablet   Other Visit Diagnoses     Special screening for malignant neoplasms, colon       Relevant Orders   Ambulatory referral to Gastroenterology   Need for vaccination against Streptococcus pneumoniae       Relevant Orders   Pneumococcal conjugate vaccine 20-valent (Completed)        Meds ordered this encounter  Medications   levothyroxine (SYNTHROID) 50 MCG tablet    Sig: Take 1 tablet (50 mcg total) by mouth daily.    Dispense:  90 tablet    Refill:  3   omeprazole (PRILOSEC) 40 MG capsule    Sig: Take 1 capsule (40 mg total) by mouth daily. Daily for 3 weeks then as needed for reflux    Dispense:  30 capsule    Refill:  3   aspirin EC 81 MG tablet    Sig: Take 1 tablet (81 mg total) by mouth daily. Swallow whole.   atorvastatin (LIPITOR) 20 MG tablet    Sig: Take 1 tablet (20 mg total) by mouth daily.    Dispense:  90 tablet    Refill:  3   famotidine (PEPCID) 20 MG tablet    Sig: Take 1 tablet (20 mg total) by mouth at bedtime.    Orders Placed This Encounter  Procedures   Pneumococcal conjugate vaccine 20-valent   TSH    Standing Status:   Future    Standing Expiration Date:   05/29/2024   T4, free    Standing Status:    Future    Standing Expiration Date:   05/29/2024   T3    Standing Status:   Future    Standing Expiration Date:   05/29/2024   Ambulatory referral to Gastroenterology    Referral Priority:   Routine    Referral Type:   Consultation    Referral Reason:   Specialty Services Required    Number of Visits Requested:   1    Patient Instructions  We will refer you for colonoscopy in [place]. You may call [facility] to schedule an appointment at [phone].  Prevnar-20 today. Thyroid levels were somewhat  low - start levothyroxine daily. Schedule lab visit in 6 weeks after starting medicine for repeat thyroid function.  If interested, check with pharmacy about new 2 shot shingles series (shingrix).  Advanced directive today.  Try pepcid 20mg  over the counter nightly for reflux. If no better with this, then may fill omeprazole prescription printed out today.  Good to see you today Return as needed or in 6 months for follow up visit   Follow up plan: Return in about 6 months (around 11/28/2023) for follow up visit.  Eustaquio Boyden, MD

## 2023-05-30 NOTE — Assessment & Plan Note (Signed)

## 2023-05-30 NOTE — Assessment & Plan Note (Signed)
TSH elevated with hypothyroid symptoms - start levothyroxine daily. RTC 6 wks rpt TFTs. RTC 6 mo f/u visit.

## 2023-05-30 NOTE — Assessment & Plan Note (Addendum)
Chronic, h/o CAD. Restart atorvastatin, aspirin.  Goal LDL <70 in CAD/stent history The 10-year ASCVD risk score (Arnett DK, et al., 2019) is: 18.7%   Values used to calculate the score:     Age: 67 years     Sex: Male     Is Non-Hispanic African American: No     Diabetic: No     Tobacco smoker: No     Systolic Blood Pressure: 136 mmHg     Is BP treated: No     HDL Cholesterol: 39.4 mg/dL     Total Cholesterol: 186 mg/dL

## 2023-05-30 NOTE — Assessment & Plan Note (Signed)
Preventative protocols reviewed and updated unless pt declined. Discussed healthy diet and lifestyle.  

## 2023-05-30 NOTE — Patient Instructions (Addendum)
We will refer you for colonoscopy in [place]. You may call [facility] to schedule an appointment at [phone].  Prevnar-20 today. Thyroid levels were somewhat low - start levothyroxine daily. Schedule lab visit in 6 weeks after starting medicine for repeat thyroid function.  If interested, check with pharmacy about new 2 shot shingles series (shingrix).  Advanced directive today.  Try pepcid 20mg  over the counter nightly for reflux. If no better with this, then may fill omeprazole prescription printed out today.  Good to see you today Return as needed or in 6 months for follow up visit

## 2023-07-11 ENCOUNTER — Other Ambulatory Visit (INDEPENDENT_AMBULATORY_CARE_PROVIDER_SITE_OTHER): Payer: No Typology Code available for payment source

## 2023-07-11 DIAGNOSIS — E039 Hypothyroidism, unspecified: Secondary | ICD-10-CM

## 2023-07-11 LAB — TSH: TSH: 3.1 u[IU]/mL (ref 0.35–5.50)

## 2023-07-11 LAB — T4, FREE: Free T4: 0.86 ng/dL (ref 0.60–1.60)

## 2023-07-12 LAB — T3: T3, Total: 112 ng/dL (ref 76–181)

## 2023-09-10 NOTE — Progress Notes (Signed)
09/11/2023 Roger [name] [id number] [birth year]  Referring provider: Eustaquio Boyden, MD Primary GI doctor: Dr. Tomasa Rand  ASSESSMENT AND PLAN:   GERD long standing history Worse at night, can have reflux into throat with some food regurgitation, no dysphagia, melena or weight loss. Lifestyle changes discussed, avoid NSAIDS, ETOH Weight loss discussed with the patient Continue pepcid as needed, PPI after procedure Will schedule for EGD to evaluate for esophagitis, barrett's, etc I discussed risks of EGD with patient today, including risk of sedation, bleeding or perforation.  Patient provides understanding and gave verbal consent to proceed.  Screening colonoscopy Family history of colon cancer in his mother Last colonoscopy remote in [place] with polyps per patient Will schedule for colon We have discussed the risks of bleeding, infection, perforation, medication reactions, and remote risk of death associated with colonoscopy. All questions were answered and the patient acknowledges these risk and wishes to proceed.  CAD  On bASA, statin No CP, SOB   Patient Care Team: Eustaquio Boyden, MD as PCP - General (Family Medicine)  HISTORY OF PRESENT ILLNESS: 68 y.o. male with a past medical history of GERD, hypothyroidism, CAD status post stent, hyperlipidemia and others listed below presents as a new patient for evaluation of colonoscopy and GERD.  Per primary care notes patient had normal colonoscopy in [place] but no reports, referred for repeat colonoscopy 2021 that was not completed.  Discussed the use of AI scribe software for clinical note transcription with the patient, who gave verbal consent to proceed.  History of Present Illness   The patient, with a long-standing history of acid reflux and regurgitation, presents for evaluation. The reflux is intermittent but has been worsening at times, causing some stomach irritability.  The patient reports that the reflux is  predominantly nocturnal and is associated with a sensation of food regurgitating, especially when lying down. However, the patient denies any difficulty swallowing or noticing any blood or dark black color in his stool. The patient has been managing the reflux with Pepcid as needed, which he reports helps when he remembers to take it.  The patient also reports occasional stomach discomfort, which occurs sporadically and varies in intensity.  The patient denies any significant weight loss. The patient has a history of heart stent placement but denies any recent chest discomfort or shortness of breath. T Patient has BM every day or every other day, no changes in bowel habits.  he patient has a past smoking history but quit in 1984. The patient also has a family history of colon and pancreatic cancer.   He  reports that he quit smoking about 41 years ago. His smoking use included cigarettes. He has never used smokeless tobacco. He reports that he does not drink alcohol and does not use drugs.  RELEVANT GI HISTORY, LABS, IMAGING:  CBC    Component Value Date/Time   WBC 9.1 10/18/2022 1132   RBC 4.70 10/18/2022 1132   HGB 14.6 10/18/2022 1132   HCT 42.8 10/18/2022 1132   PLT 247.0 10/18/2022 1132   MCV 91.1 10/18/2022 1132   MCHC 34.1 10/18/2022 1132   RDW 14.4 10/18/2022 1132   LYMPHSABS 2.2 08/16/2019 1031   MONOABS 0.3 08/16/2019 1031   EOSABS 0.3 08/16/2019 1031   BASOSABS 0.0 08/16/2019 1031   Recent Labs    10/18/22 1132  HGB 14.6    CMP     Component Value Date/Time   NA 139 05/23/2023 0850   K 4.2 05/23/2023 0850  CL 106 05/23/2023 0850   CO2 22 05/23/2023 0850   GLUCOSE 76 05/23/2023 0850   BUN 19 05/23/2023 0850   CREATININE 1.07 05/23/2023 0850   CALCIUM 9.1 05/23/2023 0850   PROT 7.4 05/23/2023 0850   ALBUMIN 4.3 05/23/2023 0850   AST 15 05/23/2023 0850   ALT 15 05/23/2023 0850   ALKPHOS 96 05/23/2023 0850   BILITOT 0.4 05/23/2023 0850      Latest Ref Rng &  Units 05/23/2023    8:50 AM 10/18/2022   11:32 AM 02/22/2020    4:50 PM  Hepatic Function  Total Protein 6.0 - 8.3 g/dL 7.4  7.7  7.4   Albumin 3.5 - 5.2 g/dL 4.3  4.5  4.3   AST 0 - 37 U/L 15  13  13    ALT 0 - 53 U/L 15  15  14    Alk Phosphatase 39 - 117 U/L 96  113  109   Total Bilirubin 0.2 - 1.2 mg/dL 0.4  0.3  0.4       Current Medications:   Current Outpatient Medications (Endocrine & Metabolic):    levothyroxine (SYNTHROID) 50 MCG tablet, Take 1 tablet (50 mcg total) by mouth daily.  Current Outpatient Medications (Cardiovascular):    atorvastatin (LIPITOR) 20 MG tablet, Take 1 tablet (20 mg total) by mouth daily.   Current Outpatient Medications (Analgesics):    aspirin EC 81 MG tablet, Take 1 tablet (81 mg total) by mouth daily. Swallow whole.   Current Outpatient Medications (Other):    famotidine (PEPCID) 20 MG tablet, Take 1 tablet (20 mg total) by mouth at bedtime.  Medical History:  Past Medical History:  Diagnosis Date   Bell's palsy x2   recurrent   CAD (coronary artery disease) 2004   with stent, unclear details   Cataract    OD>OS per notes from [facility]   COVID-19 virus infection 03/2020   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    History of chicken pox    History of hypothyroidism as a child   HLD (hyperlipidemia)    Thyroid disease    Allergies: No Known Allergies   Surgical History:  He  has a past surgical history that includes Tonsillectomy and adenoidectomy (Bilateral); Percutaneous coronary stent intervention (pci-s) (11/08/02); Colonoscopy; and Tonsillectomy. Family History:  His family history includes Alcoholism in his paternal uncle; CAD in his paternal uncle; CAD (age of onset: 51) in his father; Cancer (age of onset: 78) in his mother; Cancer (age of onset: 103) in his father; Diabetes in an other family member; Pancreatic cancer in his mother; Stroke in his paternal uncle.  REVIEW OF SYSTEMS  : All other systems  reviewed and negative except where noted in the History of Present Illness.  PHYSICAL EXAM: BP 116/80 (BP Location: Left Arm, Patient Position: Sitting, Cuff Size: Normal)   Pulse 74   Ht 5' 3.25" (1.607 m)   Wt 180 lb (81.6 kg)   SpO2 96%   BMI 31.63 kg/m  General Appearance: Well nourished, in no apparent distress. Head:   Normocephalic and atraumatic. Eyes:  sclerae anicteric,conjunctive pink  Respiratory: Respiratory effort normal, BS equal bilaterally without rales, rhonchi, wheezing. Cardio: RRR with no MRGs. Peripheral pulses intact.  Abdomen: Soft,  Obese ,active bowel sounds. No tenderness . No masses. Rectal: Not evaluated Musculoskeletal: Full ROM, Normal gait. Without edema. Skin:  Dry and intact without significant lesions or rashes Neuro: Alert and  oriented x4;  No focal deficits. Psych:  Cooperative. Normal mood and affect.    Doree Albee, PA-C 8:55 AM

## 2023-09-11 ENCOUNTER — Ambulatory Visit: Payer: No Typology Code available for payment source | Admitting: Physician Assistant

## 2023-09-11 ENCOUNTER — Encounter: Payer: Self-pay | Admitting: Physician Assistant

## 2023-09-11 VITALS — BP 116/80 | HR 74 | Ht 63.25 in | Wt 180.0 lb

## 2023-09-11 DIAGNOSIS — I2510 Atherosclerotic heart disease of native coronary artery without angina pectoris: Secondary | ICD-10-CM

## 2023-09-11 DIAGNOSIS — K219 Gastro-esophageal reflux disease without esophagitis: Secondary | ICD-10-CM | POA: Diagnosis not present

## 2023-09-11 DIAGNOSIS — Z860100 Personal history of colon polyps, unspecified: Secondary | ICD-10-CM

## 2023-09-11 DIAGNOSIS — Z800 Family history of malignant neoplasm of digestive organs: Secondary | ICD-10-CM | POA: Diagnosis not present

## 2023-09-11 MED ORDER — SUFLAVE 178.7 G PO SOLR: 1.0000 | Freq: Once | ORAL | 0 refills | Status: AC

## 2023-09-11 NOTE — Patient Instructions (Addendum)
You have been scheduled for an endoscopy and colonoscopy. Please follow the written instructions given to you at your visit today.  Please pick up your prep supplies at the pharmacy within the next 1-3 days.  If you use inhalers (even only as needed), please bring them with you on the day of your procedure.  DO NOT TAKE 7 DAYS PRIOR TO TEST- Trulicity (dulaglutide) Ozempic, Wegovy (semaglutide) Mounjaro (tirzepatide) Bydureon Bcise (exanatide extended release)  DO NOT TAKE 1 DAY PRIOR TO YOUR TEST Rybelsus (semaglutide) Adlyxin (lixisenatide) Victoza (liraglutide) Byetta (exanatide) ___________________________________________________________________________ Bonita Quin will receive your bowel preparation through [facility], which ensures the lowest copay and home delivery, with outreach via text or call from an [phone] number. Please respond promptly to avoid rescheduling. If you are interested in alternative options or have any questions please contact them at [phone]  Your Provider Has Sent Your Bowel Prep Regimen To [facility] What to expect. [facility] will contact you to verify your information and collect your copay, if applicable. Enjoy the comfort of your home while we deliver your prescription to you, free of any shipping charges. Fast, FREE delivery or shipping. [facility] accepts all major insurance benefits and applies discounts & coupons  Have additional questions? [facility]'s patient care team is always here to help.  Chat: [email or web address] Call: [phone] Email: [email or web address] NCPDP: [id number] How will we contact you? Welcome Phone call  a Welcome text and a Checkout link in a text Texts you receive from [phone] Are Not Spam.   *To set up delivery, you must complete the checkout process via link or speak to one of our patient care representatives. If we are unable to reach you, your prescription may be delayed.   Pepcid up to twice a day, at  least at night Avoid spicy and acidic foods Avoid fatty foods Limit your intake of coffee, tea, alcohol, and carbonated drinks Work to maintain a healthy weight Keep the head of the bed elevated at least 3 inches with blocks or a wedge pillow if you are having any nighttime symptoms Stay upright for 2 hours after eating Avoid meals and snacks three to four hours before bedtime

## 2023-10-06 ENCOUNTER — Telehealth: Payer: Self-pay | Admitting: *Deleted

## 2023-10-06 DIAGNOSIS — I2510 Atherosclerotic heart disease of native coronary artery without angina pectoris: Secondary | ICD-10-CM

## 2023-10-06 NOTE — Telephone Encounter (Signed)
 Dr. Cherryl Corona,  This pt is scheduled with you on 10/13/23.  He has a dx of CAD but there are no studies or notes in order to evaluate its severity.  We will need either a recent cardiac echo or clearance to clear him for care at [facility].  Thanks,  Rogena Class

## 2023-10-07 NOTE — Telephone Encounter (Signed)
Spoke with pt and his wife and he is aware. Referral placed for pt to see cardiology for cardiac clearance. Procedure appt cancelled and will be rescheduled once cleared by cardiology.

## 2023-10-13 ENCOUNTER — Encounter: Payer: No Typology Code available for payment source | Admitting: Gastroenterology

## 2023-10-24 ENCOUNTER — Encounter: Payer: Self-pay | Admitting: Family Medicine

## 2023-10-24 DIAGNOSIS — Z01818 Encounter for other preprocedural examination: Secondary | ICD-10-CM

## 2023-11-28 ENCOUNTER — Encounter: Payer: Self-pay | Admitting: Family Medicine

## 2023-11-28 ENCOUNTER — Ambulatory Visit (INDEPENDENT_AMBULATORY_CARE_PROVIDER_SITE_OTHER): Payer: No Typology Code available for payment source | Admitting: Family Medicine

## 2023-11-28 ENCOUNTER — Ambulatory Visit: Payer: No Typology Code available for payment source | Admitting: Family Medicine

## 2023-11-28 VITALS — BP 152/66 | HR 67 | Temp 98.0°F | Ht 63.25 in | Wt 178.5 lb

## 2023-11-28 DIAGNOSIS — I2510 Atherosclerotic heart disease of native coronary artery without angina pectoris: Secondary | ICD-10-CM | POA: Diagnosis not present

## 2023-11-28 DIAGNOSIS — R011 Cardiac murmur, unspecified: Secondary | ICD-10-CM

## 2023-11-28 DIAGNOSIS — Z91038 Other insect allergy status: Secondary | ICD-10-CM | POA: Diagnosis not present

## 2023-11-28 DIAGNOSIS — R0609 Other forms of dyspnea: Secondary | ICD-10-CM

## 2023-11-28 DIAGNOSIS — E039 Hypothyroidism, unspecified: Secondary | ICD-10-CM

## 2023-11-28 DIAGNOSIS — R030 Elevated blood-pressure reading, without diagnosis of hypertension: Secondary | ICD-10-CM | POA: Diagnosis not present

## 2023-11-28 DIAGNOSIS — E66811 Obesity, class 1: Secondary | ICD-10-CM

## 2023-11-28 DIAGNOSIS — E785 Hyperlipidemia, unspecified: Secondary | ICD-10-CM | POA: Diagnosis not present

## 2023-11-28 DIAGNOSIS — I340 Nonrheumatic mitral (valve) insufficiency: Secondary | ICD-10-CM | POA: Insufficient documentation

## 2023-11-28 MED ORDER — FAMOTIDINE 20 MG PO TABS: 20.0000 mg | ORAL_TABLET | Freq: Every evening | ORAL | Status: DC | PRN

## 2023-11-28 MED ORDER — EPINEPHRINE 0.3 MG/0.3ML IJ SOAJ: 0.3000 mg | INTRAMUSCULAR | 1 refills | Status: AC | PRN

## 2023-11-28 MED ORDER — NITROGLYCERIN 0.4 MG SL SUBL: 0.4000 mg | SUBLINGUAL_TABLET | SUBLINGUAL | 3 refills | Status: AC | PRN

## 2023-11-28 NOTE — Assessment & Plan Note (Signed)
 Epi pen refilled for h/o allergic reaction (angioedema, hives) after some insect bite (2021).

## 2023-11-28 NOTE — Addendum Note (Signed)
 Addended by: Eustaquio Boyden on: 11/28/2023 10:10 AM   Modules accepted: Orders

## 2023-11-28 NOTE — Assessment & Plan Note (Addendum)
 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  Concerned this may be anginal equivalent in h/o CAD s/p remote stent in [place].  Will expedite cardiology evaluation (updated referral to urgent today).  Will order echocardiogram, rec avoid exertion until seen by cardiology.  Rx SL nitroglycerin to have on hand.

## 2023-11-28 NOTE — Patient Instructions (Addendum)
 Call [facility] heart care at [phone]  to schedule cardiac evaluation prior to colonoscopy.  I will order heart ultrasound in [place] as well.  Epi pen refilled Good to see you today  Return fasting at your convenience for fasting labs.  Return in 6 months for physical.

## 2023-11-28 NOTE — Progress Notes (Signed)
 Ph: [phone] Fax: [phone]   Patient ID: Roger [name], male    DOB: 09-08-1955, 68 y.o.   MRN: [id number]  This visit was conducted in person.  BP (!) 152/66   Pulse 67   Temp 98 F (36.7 C) (Oral)   Ht 5' 3.25" (1.607 m)   Wt 178 lb 8 oz (81 kg)   SpO2 97%   BMI 31.37 kg/m    CC: 6 mo f/u visit  Subjective:   HPI: Roger [name] is a 68 y.o. male presenting on 11/28/2023 for Medical Management of Chronic Issues (Here for 6 mo thyroid f/u. Pt accompanied by wife, Bonita Quin. )   See prior note for details.  Pending colonoscopy - was advised by anesthesiology to have cardiac clearance due to h/o CAD with remote stent. Referral pending.   He is taking atorvastatin 20mg  and aspirin 81mg  daily.   Dietary habits have changed - eating healthier more fish and chicken, less red meat, more vegetables.   He notes intermittent exertional dyspnea with some tightness, not associated with chest pain or palpitations, nausea, or diaphoresis. This happens at work - tires him out.   ~10 PY hx - quit smoking 1980s.   BP elevated today - no h/o hypertension.   TSH elevated to 7 on last check with endorsed hypothyroid symptoms - at that time we started levothyroxine daily. On repeat testing:  Lab Results  Component Value Date   TSH 3.10 07/11/2023   T3TOTAL 112 07/11/2023   Wife notes his legs jerk at night but no significant snoring or witnessed apnea. Some PNDyspnea. He had OSA evaluation in [place] - told normal.      Relevant past medical, surgical, family and social history reviewed and updated as indicated. Interim medical history since our last visit reviewed. Allergies and medications reviewed and updated. Outpatient Medications Prior to Visit  Medication Sig Dispense Refill   aspirin EC 81 MG tablet Take 1 tablet (81 mg total) by mouth daily. Swallow whole.     atorvastatin (LIPITOR) 20 MG tablet Take 1 tablet (20 mg total) by mouth daily. 90 tablet 3    levothyroxine (SYNTHROID) 50 MCG tablet Take 1 tablet (50 mcg total) by mouth daily. 90 tablet 3   famotidine (PEPCID) 20 MG tablet Take 1 tablet (20 mg total) by mouth at bedtime.     No facility-administered medications prior to visit.     Per HPI unless specifically indicated in ROS section below Review of Systems  Objective:  BP (!) 152/66   Pulse 67   Temp 98 F (36.7 C) (Oral)   Ht 5' 3.25" (1.607 m)   Wt 178 lb 8 oz (81 kg)   SpO2 97%   BMI 31.37 kg/m   Wt Readings from Last 3 Encounters:  11/28/23 178 lb 8 oz (81 kg)  09/11/23 180 lb (81.6 kg)  05/30/23 173 lb 8 oz (78.7 kg)      Physical Exam Vitals and nursing note reviewed.  Constitutional:      Appearance: Normal appearance. He is not ill-appearing.  HENT:     Head: Normocephalic and atraumatic.     Mouth/Throat:     Mouth: Mucous membranes are moist.     Pharynx: Oropharynx is clear. No oropharyngeal exudate or posterior oropharyngeal erythema.  Eyes:     Extraocular Movements: Extraocular movements intact.     Conjunctiva/sclera: Conjunctivae normal.     Pupils: Pupils are equal, round, and reactive to light.  Neck:  Thyroid: No thyroid mass or thyromegaly.     Vascular: Carotid bruit (L sided) present.  Cardiovascular:     Rate and Rhythm: Normal rate and regular rhythm.     Pulses: Normal pulses.     Heart sounds: Murmur (2/6 systolic USB) heard.  Pulmonary:     Effort: Pulmonary effort is normal. No respiratory distress.     Breath sounds: Normal breath sounds. No wheezing, rhonchi or rales.  Musculoskeletal:     Cervical back: Normal range of motion and neck supple.     Right lower leg: No edema.     Left lower leg: No edema.  Skin:    General: Skin is warm and dry.     Findings: No rash.  Neurological:     Mental Status: He is alert.  Psychiatric:        Mood and Affect: Mood normal.        Behavior: Behavior normal.       Results for orders placed or performed in visit on 07/11/23   T3   Collection Time: 07/11/23  8:53 AM  Result Value Ref Range   T3, Total 112 76 - 181 ng/dL  T4, free   Collection Time: 07/11/23  8:53 AM  Result Value Ref Range   Free T4 0.86 0.60 - 1.60 ng/dL  TSH   Collection Time: 07/11/23  8:53 AM  Result Value Ref Range   TSH 3.10 0.35 - 5.50 uIU/mL    Assessment & Plan:   Problem List Items Addressed This Visit     CAD (coronary artery disease)   Refer back to cardiology - anesthesiology requested cardiac clearance prior to proceeding with colonoscopy.       Relevant Medications   EPINEPHrine 0.3 mg/0.3 mL IJ SOAJ injection   nitroGLYCERIN (NITROSTAT) 0.4 MG SL tablet   HLD (hyperlipidemia)   Chronic. Will return for fasting labs to update FLP after restarting atorvastatin 05/2023.  Goal LDL <70 in CAD/stent hx. The 10-year ASCVD risk score (Arnett DK, et al., 2019) is: 23.7%   Values used to calculate the score:     Age: 30 years     Sex: Male     Is Non-Hispanic African American: No     Diabetic: No     Tobacco smoker: No     Systolic Blood Pressure: 152 mmHg     Is BP treated: No     HDL Cholesterol: 39.4 mg/dL     Total Cholesterol: 186 mg/dL       Relevant Medications   EPINEPHrine 0.3 mg/0.3 mL IJ SOAJ injection   nitroGLYCERIN (NITROSTAT) 0.4 MG SL tablet   Other Relevant Orders   Lipid panel   Hepatic function panel   Obesity, Class I, BMI 30-34.9   Elevated blood pressure reading in office without diagnosis of hypertension   BP elevated in office today.  He is not on blood pressure medication.  Will recommend he start monitoring BP readings at home - log sheet provided today.  If consistently >140/90, will recommend antihypertensive. Pulse already low at 67.       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       Hypothyroidism (acquired) - Primary   Chronic, improved levels on low dose levothyroxine - continue this,  update TSH when he returns fasting.       Relevant Orders   TSH   Exertional dyspnea  Notes intermittent exertional dyspnea when walking in [place] (works there part time).  Concerned this may be anginal equivalent in h/o CAD s/p remote stent in [place].  Will expedite cardiology evaluation (updated referral to urgent today).  Will order echocardiogram, rec avoid exertion until seen by cardiology.  Rx SL nitroglycerin to have on hand.       Relevant Orders   ECHOCARDIOGRAM COMPLETE   Systolic murmur   Newly heard suspect with radiation to L carotid.  Will order echocardiogram.       Relevant Orders   ECHOCARDIOGRAM COMPLETE     Meds ordered this encounter  Medications   famotidine (PEPCID) 20 MG tablet    Sig: Take 1 tablet (20 mg total) by mouth at bedtime as needed for heartburn or indigestion.   EPINEPHrine 0.3 mg/0.3 mL IJ SOAJ injection    Sig: Inject 0.3 mg into the muscle as needed for anaphylaxis.    Dispense:  2 each    Refill:  1   nitroGLYCERIN (NITROSTAT) 0.4 MG SL tablet    Sig: Place 1 tablet (0.4 mg total) under the tongue every 5 (five) minutes as needed for chest pain.    Dispense:  30 tablet    Refill:  3    Orders Placed This Encounter  Procedures   Lipid panel    Standing Status:   Future    Expiration Date:   11/27/2024   TSH    Standing Status:   Future    Expiration Date:   11/27/2024   Hepatic function panel    Standing Status:   Future    Expiration Date:   11/27/2024   ECHOCARDIOGRAM COMPLETE    Standing Status:   Future    Expiration Date:   11/27/2024    Where should this test be performed:   [facility])    Does the patient weigh less than or greater than 250 lbs?:   Patient weighs less than 250 lbs    Perflutren DEFINITY (image enhancing agent) should be administered unless hypersensitivity or allergy exist:   Administer Perflutren    Reason for exam-Echo:   Murmur R01.1    Reason for exam-Echo:   CAD Native Vessel   I25.10    Patient Instructions  Call [facility] heart care at [phone]  to schedule cardiac evaluation prior to colonoscopy.  I will order heart ultrasound in [place] as well.  Epi pen refilled Good to see you today  Return fasting at your convenience for fasting labs.  Return in 6 months for physical.   Follow up plan: Return in about 6 months (around 05/29/2024), or if symptoms worsen or fail to improve, for annual exam, prior fasting for blood work, medicare wellness visit.  Eustaquio Boyden, MD

## 2023-11-28 NOTE — Assessment & Plan Note (Addendum)
 Chronic, improved levels on low dose levothyroxine - continue this, update TSH when he returns fasting.

## 2023-11-28 NOTE — Telephone Encounter (Signed)
 Spoke with patient at [facility]. I've updated referral to urgent given symptoms he is having.  Will forward to referral coordinators to expedite appt.  I have also ordered echocardiogram for new murmur heard today;.

## 2023-11-28 NOTE — Assessment & Plan Note (Signed)
 BP elevated in office today.  He is not on blood pressure medication.  Will recommend he start monitoring BP readings at home - log sheet provided today.  If consistently >140/90, will recommend antihypertensive. Pulse already low at 67.

## 2023-11-28 NOTE — Assessment & Plan Note (Signed)
 Refer back to cardiology - anesthesiology requested cardiac clearance prior to proceeding with colonoscopy.

## 2023-11-28 NOTE — Assessment & Plan Note (Signed)
 Chronic. Will return for fasting labs to update FLP after restarting atorvastatin 05/2023.  Goal LDL <70 in CAD/stent hx. The 10-year ASCVD risk score (Arnett DK, et al., 2019) is: 23.7%   Values used to calculate the score:     Age: 68 years     Sex: Male     Is Non-Hispanic African American: No     Diabetic: No     Tobacco smoker: No     Systolic Blood Pressure: 152 mmHg     Is BP treated: No     HDL Cholesterol: 39.4 mg/dL     Total Cholesterol: 186 mg/dL

## 2023-11-28 NOTE — Assessment & Plan Note (Signed)
 Newly heard suspect with radiation to L carotid.  Will order echocardiogram.

## 2023-12-01 ENCOUNTER — Other Ambulatory Visit (INDEPENDENT_AMBULATORY_CARE_PROVIDER_SITE_OTHER)

## 2023-12-01 DIAGNOSIS — E039 Hypothyroidism, unspecified: Secondary | ICD-10-CM

## 2023-12-01 DIAGNOSIS — E785 Hyperlipidemia, unspecified: Secondary | ICD-10-CM | POA: Diagnosis not present

## 2023-12-01 LAB — LIPID PANEL
Cholesterol: 124 mg/dL (ref 0–200)
HDL: 35.3 mg/dL — ABNORMAL LOW (ref >39.00)
LDL Cholesterol: 65 mg/dL (ref 0–99)
NonHDL: 88.91
Total CHOL/HDL Ratio: 4
Triglycerides: 121 mg/dL (ref 0.0–149.0)
VLDL: 24.2 mg/dL (ref 0.0–40.0)

## 2023-12-01 LAB — HEPATIC FUNCTION PANEL
ALT: 14 U/L (ref 0–53)
AST: 12 U/L (ref 0–37)
Albumin: 4.2 g/dL (ref 3.5–5.2)
Alkaline Phosphatase: 117 U/L (ref 39–117)
Bilirubin, Direct: 0.1 mg/dL (ref 0.0–0.3)
Total Bilirubin: 0.4 mg/dL (ref 0.2–1.2)
Total Protein: 6.9 g/dL (ref 6.0–8.3)

## 2023-12-01 LAB — TSH: TSH: 1.93 u[IU]/mL (ref 0.35–5.50)

## 2023-12-02 NOTE — Telephone Encounter (Signed)
 Sent to a message to Cardiology to advise of sooner appt request - sooner than July.  See Referral message.  Echo appt is scheduled for 01/05/2024

## 2023-12-03 ENCOUNTER — Encounter: Payer: Self-pay | Admitting: Family Medicine

## 2024-01-05 ENCOUNTER — Ambulatory Visit (HOSPITAL_COMMUNITY): Attending: Family Medicine

## 2024-01-05 DIAGNOSIS — R0609 Other forms of dyspnea: Secondary | ICD-10-CM | POA: Diagnosis not present

## 2024-01-05 DIAGNOSIS — R011 Cardiac murmur, unspecified: Secondary | ICD-10-CM | POA: Diagnosis not present

## 2024-01-05 LAB — ECHOCARDIOGRAM COMPLETE
Area-P 1/2: 4.15 cm2
S' Lateral: 3.1 cm

## 2024-01-08 ENCOUNTER — Ambulatory Visit: Payer: Self-pay | Admitting: Family Medicine

## 2024-01-08 DIAGNOSIS — I340 Nonrheumatic mitral (valve) insufficiency: Secondary | ICD-10-CM

## 2024-01-12 ENCOUNTER — Encounter: Payer: Self-pay | Admitting: Cardiology

## 2024-01-12 ENCOUNTER — Ambulatory Visit: Attending: Cardiology | Admitting: Cardiology

## 2024-01-12 VITALS — BP 146/78 | HR 57 | Ht 63.0 in | Wt 180.1 lb

## 2024-01-12 DIAGNOSIS — I200 Unstable angina: Secondary | ICD-10-CM | POA: Diagnosis not present

## 2024-01-12 DIAGNOSIS — I25119 Atherosclerotic heart disease of native coronary artery with unspecified angina pectoris: Secondary | ICD-10-CM | POA: Diagnosis not present

## 2024-01-12 DIAGNOSIS — R0609 Other forms of dyspnea: Secondary | ICD-10-CM | POA: Diagnosis not present

## 2024-01-12 DIAGNOSIS — I340 Nonrheumatic mitral (valve) insufficiency: Secondary | ICD-10-CM | POA: Diagnosis not present

## 2024-01-12 DIAGNOSIS — Z01818 Encounter for other preprocedural examination: Secondary | ICD-10-CM | POA: Diagnosis not present

## 2024-01-12 DIAGNOSIS — E785 Hyperlipidemia, unspecified: Secondary | ICD-10-CM

## 2024-01-12 DIAGNOSIS — I10 Essential (primary) hypertension: Secondary | ICD-10-CM | POA: Diagnosis not present

## 2024-01-12 DIAGNOSIS — R0602 Shortness of breath: Secondary | ICD-10-CM | POA: Diagnosis not present

## 2024-01-12 MED ORDER — AMLODIPINE BESYLATE 5 MG PO TABS: 5.0000 mg | ORAL_TABLET | Freq: Every day | ORAL | 3 refills | Status: DC

## 2024-01-12 NOTE — Assessment & Plan Note (Signed)
 With known CAD, actual goal is less than 55 as opposed to less than 70.   He is currently on atorvastatin  20 mg daily with an LDL of 65. Pending results on cardiac catheterization may want to be more aggressive by either increasing to 40 mg daily or converting to rosuvastatin.

## 2024-01-12 NOTE — Assessment & Plan Note (Addendum)
 Intermittent chest tightness and dyspnea on exertion suggestive of angina.  Recent presyncope and hypertension.  Echocardiogram shows normal left ventricular ejection fraction and mild left atrial dilation.  Possible progression of coronary artery disease since stent placement in 2004. Differential includes restenosis or new disease.  History of PCI-LCx in 2004 due to angina. Current symptoms may indicate restenosis or new disease. Cardiac catheterization planned for definitive evaluation due to symptoms and history. Informed consent discussed, including risks of bleeding, arrhythmia, myocardial infarction, stroke, and potential need for stenting or bypass surgery. Benefits include definitive diagnosis and potential treatment. Stress test considered but deemed less reliable given symptoms. - Schedule cardiac catheterization with Dr. [place] for Wednesday. - Prescribe amlodipine  5 mg daily for angina benefit. - Ensure availability of nitroglycerin  for symptomatic relief. - Continue aspirin  81 mg daily along with atorvastatin  20 mg daily. - With resting heart rate of 57 bpm, will hold off on beta-blocker.

## 2024-01-12 NOTE — Assessment & Plan Note (Signed)
 Elevated blood pressure during recent episodes of angina and presyncope. Likely contributing to cardiac symptoms and mild mitral valve regurgitation on echocardiogram. - Prescribe amlodipine  5 mg daily for blood pressure control.

## 2024-01-12 NOTE — Assessment & Plan Note (Signed)
 Now started to have some chest tightness on exertion as well as dyspnea.  They cannot murmur back to 2004 when he had his PCI, but symptoms seem to be similar. Symptoms at work occurring with more frequency and more prominent now with the chest discomfort as well. Definitive testing with coronary angiography recommended, as stress test would not definitively if the question. - Schedule cardiac catheterization with Dr. [place] for Wednesday (01/14/2024).  See informed consent below - Prescribe amlodipine  5 mg daily. - Ensure availability of nitroglycerin  for symptomatic relief. -

## 2024-01-12 NOTE — Patient Instructions (Addendum)
 Medication Instructions:  Amlodipine  5 mg daily  *If you need a refill on your cardiac medications before your next appointment, please call your pharmacy*   Lab Work: CBC BMP If you have labs (blood work) drawn today and your tests are completely normal, you will receive your results only by: MyChart Message (if you have MyChart) OR A paper copy in the mail If you have any lab test that is abnormal or we need to change your treatment, we will call you to review the results.   Testing/Procedures:  Your physician has requested that you have a cardiac catheterization. Cardiac catheterization is used to diagnose and/or treat various heart conditions. Doctors may recommend this procedure for a number of different reasons. The most common reason is to evaluate chest pain. Chest pain can be a symptom of coronary artery disease (CAD), and cardiac catheterization can show whether plaque is narrowing or blocking your heart's arteries. This procedure is also used to evaluate the valves, as well as measure the blood flow and oxygen levels in different parts of your heart.  Please follow instruction sheet, as given.   Follow-Up: At [facility], you and your health needs are our priority.  As part of our continuing mission to provide you with exceptional heart care, we have created designated Provider Care Teams.  These Care Teams include your primary Cardiologist (physician) and Advanced Practice Providers (APPs -  Physician Assistants and Nurse Practitioners) who all work together to provide you with the care you need, when you need it.     Your next appointment:   2 week(s)  The format for your next appointment:   In Person  Provider:   One of our Advanced Practice Providers (APPs): Melita Springer, PA-C  Friddie Jetty, NP Evaline Hill, NP  Theotis Flake, PA-C Lawana Pray, NP  Willis Harter, PA-C Lovette Rud, PA-C  [place], PA-C Ernest Dick, NP  Marlana Silvan, NP Marcie Sever,  PA-C  Laquita Plant, PA-C    Dayna Dunn, PA-C  Marlyse Single, PA-C Palmer Bobo, NP Katlyn West, NP Callie Goodrich, PA-C  Evan Williams, PA-C Sheng Haley, PA-C  Xika Zhao, NP Kathleen Johnson, PA-C  .   Other Instructions   [facility] A DEPT OF [facility] AT [address] A DEPT OF THE [place] [address]  [place] [address] Dept: [phone] Loc: [phone]  Roger [name]  01/12/2024  You are scheduled for a Cardiac Catheterization on Tuesday, May 21 with Dr. Peter Swaziland.  1. Please arrive at the [facility] (Main Entrance A) at [facility]: [address] [place], [place] [address] at 6:30 AM (This time is 2 hour(s) before your procedure to ensure your preparation).   Free valet parking service is available. You will check in at ADMITTING. The support person will be asked to wait in the waiting room.  It is OK to have someone drop you off and come back when you are ready to be discharged.    Special note: Every effort is made to have your procedure done on time. Please understand that emergencies sometimes delay scheduled procedures.  2. Diet: Do not eat solid foods after midnight.  The patient may have clear liquids until 5am upon the day of the procedure.  3. Labs: You will need to have blood drawn on CBC , BMP,  at [facility] (1st Floor), [address]  [place], [place] [address]. You do not need to be fasting.  4. Medication instructions in preparation for your procedure:   Contrast Allergy: No   On the morning of your procedure, take your Aspirin  81 mg and any morning medicines NOT listed above.  You may use sips of water.  5. Plan to go home the same day, you will only stay overnight if medically necessary. 6. Bring a current list of your medications and current insurance cards. 7. You MUST have a responsible person to drive you home. 8.  Someone MUST be with you the first 24 hours after you arrive home or your discharge will be delayed. 9. Please wear clothes that are easy to get on and off and wear slip-on shoes.  Thank you for allowing us  to care for you!   -- [facility]

## 2024-01-12 NOTE — Assessment & Plan Note (Signed)
 Exertional dyspnea with now having chest tightness.   Echo otherwise normal, therefore need to exclude ischemic etiology. Less likely related to deconditioning which would be diagnosis of exclusion following cath

## 2024-01-12 NOTE — H&P (View-Only) (Signed)
 Cardiology Office Note:  .   Date:  01/12/2024  ID:  Roger [name], DOB Dec 17, 1955, MRN [id number] PCP: Claire Crick, MD  [facility] Providers Cardiologist:  None     Chief Complaint  Patient presents with   New Patient (Initial Visit)    Evaluation of dyspnea and chest tightness   Coronary Artery Disease    PCI in 2004-has a card showing stent to the LCx.    Patient Profile: .     Roger [name] is an mildly obese 68 y.o. male non-smoker with a PMH notable for CAD-PCI LCx in 2004 hyperlipidemia who presents here for Exertional Dyspnea/"Cardiac Clearance" for Colonoscopy at the request of Claire Crick, MD.  PMH: CAD (cath for progressive angina-2004) severe LCx stenosis-DES PCI with Cordis 2.5 x 18 mm stent; was also noted to have additional 40 to 50% stenoses elsewhere.  Roger [name], [place] [facility]) Hyperlipidemia Hypertension    Roger [name] was referred by Dr. Mariam Shingles following his visit on November 28, 2023.  Apparently he had been seen by GI and had planned for surveillance colonoscopy.  Anesthesiology recommended cardiology follow-up "cardiac clearance "due to his history of CAD-PCI.  He been tried to have a change in diet with the eating healthier with less red meats and more vegetables more poultry and fish over the last several months but has been noticing intermittent exertional dyspnea with some chest tightness but not necessarily chest "pain ".  No palpitations more than a fluttering sensation.  No nausea or diaphoresis.  Just generally feeling poorly.  2D echo ordered-reviewed below  Subjective  Discussed the use of AI scribe software for clinical note transcription with the patient, who gave verbal consent to proceed.  History of Present Illness Roger [name] is a 68 year old male with coronary artery disease who presents with chest tightness and dyspnea on exertion. [name], Roger [name]. He was referred by Dr. Mariam Shingles for evaluation of his  cardiac symptoms.  He experiences intermittent chest tightness and shortness of breath, particularly during physical activities such as walking. A significant episode occurred last Saturday while working at [employer], where he felt hot, sweaty, and faint, prompting him to leave work. During this episode, he did not experience chest pain but felt short of breath and faint. His blood pressure was elevated at home following this event, measuring around 180/95.  He has a history of coronary artery disease with a stent placed in 2004 due to chest pain. He was on Plavix for a year post-stent and has been on aspirin  since. He has not had regular cardiology follow-up since 2012. No recent episodes of chest pain similar to those experienced in 2004 but notes occasional chest tightness and shortness of breath with exertion, such as walking in the park or at the mall.  He has a history of hypothyroidism and hyperlipidemia, for which he takes medication. He denies any history of high blood pressure, although he notes recent elevated readings. He also reports cold feet frequently but denies any leg swelling or pain on exertion.  He had an echocardiogram recently. No heart racing, palpitations, or syncope, although he feels tired frequently.  He was evaluated for sleep apnea in the past, which was negative. He experiences nasal drip and allergies, which he attributes to occasional episodes of waking up feeling like he is choking.  Cardiovascular ROS: positive for - chest pain, dyspnea on exertion, irregular heartbeat, and sensation of feeling lightheaded and dizzy with borderline near syncope on  occasion.  Not associate with tachycardia. negative for - edema, loss of consciousness, orthopnea, palpitations, paroxysmal nocturnal dyspnea, rapid heart rate, shortness of breath, or TIA/amaurosis fugax, claudication  ROS:  Review of Systems - Negative except symptoms noted above in HPI    Objective   Current Meds   Medication Sig   amLODipine  (NORVASC ) 5 MG tablet Take 1 tablet (5 mg total) by mouth daily.   aspirin  EC 81 MG tablet Take 1 tablet (81 mg total) by mouth daily. Swallow whole.   atorvastatin  (LIPITOR) 20 MG tablet Take 1 tablet (20 mg total) by mouth daily.   levothyroxine  (SYNTHROID ) 50 MCG tablet Take 1 tablet (50 mcg total) by mouth daily.   [DISCONTINUED] famotidine  (PEPCID ) 20 MG tablet Take 1 tablet (20 mg total) by mouth at bedtime as needed for heartburn or indigestion.     Studies Reviewed: Aaron Aas   EKG Interpretation Date/Time:  Monday Jan 12 2024 08:43:02 EDT Ventricular Rate:  57 PR Interval:  172 QRS Duration:  84 QT Interval:  404 QTC Calculation: 393 R Axis:   22  Text Interpretation: Sinus bradycardia Nonspecific T wave abnormality No previous ECGs available Confirmed by Randene Bustard ([id number]) on 01/12/2024 8:49:15 AM    Lab Results  Component Value Date   CHOL 124 12/01/2023   HDL 35.30 (L) 12/01/2023   LDLCALC 65 12/01/2023   TRIG 121.0 12/01/2023   CHOLHDL 4 12/01/2023   Lab Results  Component Value Date   NA 139 05/23/2023   K 4.2 05/23/2023   CREATININE 1.07 05/23/2023   GFR 71.12 05/23/2023   GLUCOSE 76 05/23/2023   Lab Results  Component Value Date   WBC 9.1 10/18/2022   HGB 14.6 10/18/2022   HCT 42.8 10/18/2022   MCV 91.1 10/18/2022   PLT 247.0 10/18/2022   ECHO 01/05/2024: EF 55 to 60%.  No RWMA.  Indeterminate diastolic parameters.  Normal RV size and function.  Mild LA dilation.  Mild MR.  AV sclerosis with no stenosis.  Normal RAP.   Risk Assessment/Calculations:     HYPERTENSION CONTROL Vitals:   01/12/24 0839 01/12/24 0910  BP: (!) 150/80 (!) 146/78    The patient's blood pressure is elevated above target today.  In order to address the patient's elevated BP: A new medication was prescribed today. (Adding amlodipine  5 mg daily)          Physical Exam:   VS:  BP (!) 146/78   Pulse (!) 57   Ht 5\' 3"  (1.6 m)   Wt 180 lb 1 oz  (81.7 kg)   SpO2 97%   BMI 31.90 kg/m    Wt Readings from Last 3 Encounters:  01/12/24 180 lb 1 oz (81.7 kg)  11/28/23 178 lb 8 oz (81 kg)  09/11/23 180 lb (81.6 kg)    GEN: Well nourished, well groomed in no acute distress; healthy-appearing.  Mildly obese NECK: No JVD; No carotid bruits CARDIAC: RRR; Normal S1, S2; no murmurs, rubs, gallops RESPIRATORY:  Clear to auscultation without rales, wheezing or rhonchi ; nonlabored, good air movement. ABDOMEN: Soft, non-tender, non-distended EXTREMITIES:  No edema; No deformity      ASSESSMENT AND PLAN: .    Problem List Items Addressed This Visit       Cardiology Problems   Coronary artery disease involving native coronary artery of native heart with angina pectoris (HCC) - Primary (Chronic)   Intermittent chest tightness and dyspnea on exertion suggestive of angina.  Recent presyncope and hypertension.  Echocardiogram shows normal left ventricular ejection fraction and mild left atrial dilation.  Possible progression of coronary artery disease since stent placement in 2004. Differential includes restenosis or new disease.  History of PCI-LCx in 2004 due to angina. Current symptoms may indicate restenosis or new disease. Cardiac catheterization planned for definitive evaluation due to symptoms and history. Informed consent discussed, including risks of bleeding, arrhythmia, myocardial infarction, stroke, and potential need for stenting or bypass surgery. Benefits include definitive diagnosis and potential treatment. Stress test considered but deemed less reliable given symptoms. - Schedule cardiac catheterization with Dr. Swaziland for Wednesday. - Prescribe amlodipine  5 mg daily for angina benefit. - Ensure availability of nitroglycerin  for symptomatic relief. - Continue aspirin  81 mg daily along with atorvastatin  20 mg daily. - With resting heart rate of 57 bpm, will hold off on beta-blocker.      Relevant Medications   amLODipine   (NORVASC ) 5 MG tablet   Other Relevant Orders   CBC   Basic metabolic panel with GFR   Essential hypertension   Elevated blood pressure during recent episodes of angina and presyncope. Likely contributing to cardiac symptoms and mild mitral valve regurgitation on echocardiogram. - Prescribe amlodipine  5 mg daily for blood pressure control.      Relevant Medications   amLODipine  (NORVASC ) 5 MG tablet   Hyperlipidemia with target low density lipoprotein (LDL) cholesterol less than 55 mg/dL (Chronic)   With known CAD, actual goal is less than 55 as opposed to less than 70.   He is currently on atorvastatin  20 mg daily with an LDL of 65. Pending results on cardiac catheterization may want to be more aggressive by either increasing to 40 mg daily or converting to rosuvastatin.      Relevant Medications   amLODipine  (NORVASC ) 5 MG tablet   Mild mitral regurgitation by prior echocardiography (Chronic)   Mild MR noted on echo, nothing on exam. Likely not causing any issue.  Continue to monitor. Treat BP accordingly.      Relevant Medications   amLODipine  (NORVASC ) 5 MG tablet   Progressive angina (HCC) (Chronic)   Now started to have some chest tightness on exertion as well as dyspnea.  They cannot murmur back to 2004 when he had his PCI, but symptoms seem to be similar. Symptoms at work occurring with more frequency and more prominent now with the chest discomfort as well. Definitive testing with coronary angiography recommended, as stress test would not definitively if the question. - Schedule cardiac catheterization with Dr. Swaziland for Wednesday (01/14/2024).  See informed consent below - Prescribe amlodipine  5 mg daily. - Ensure availability of nitroglycerin  for symptomatic relief. -       Relevant Medications   amLODipine  (NORVASC ) 5 MG tablet   Other Relevant Orders   CBC   Basic metabolic panel with GFR     Other   Dyspnea on exertion (Chronic)   Exertional dyspnea with  now having chest tightness.   Echo otherwise normal, therefore need to exclude ischemic etiology. Less likely related to deconditioning which would be diagnosis of exclusion following cath      Relevant Orders   EKG 12-Lead (Completed)   CBC   Basic metabolic panel with GFR   Pre-op evaluation   Precath labs ordered. Shared Decision Making/Informed Consent Noted below      Relevant Orders   EKG 12-Lead (Completed)   CBC   Basic metabolic panel with GFR        Informed Consent  Shared Decision Making/Informed Consent The risks [stroke (1 in 1000), death (1 in 1000), kidney failure [usually temporary] (1 in 500), bleeding (1 in 200), allergic reaction [possibly serious] (1 in 200)], benefits (diagnostic support and management of coronary artery disease) and alternatives of a cardiac catheterization were discussed in detail with Mr. Astorga and he is willing to proceed.      Follow-Up: Return in about 2 weeks (around 01/26/2024) for Post cath visit with APP, [employer]. Plan for post-cardiac catheterization evaluation and management of cardiac symptoms. - Schedule follow-up appointment with PA post-cardiac catheterization to assess recovery and discuss further management.  Recording duration: 33 minutes; 30 minutes reviewing chart and additional data.  Total time 63 minutes      Signed, Arleen Lacer, MD, MS Randene Bustard, M.D., M.S. Interventional Chartered certified accountant  Pager # [phone]

## 2024-01-12 NOTE — Assessment & Plan Note (Signed)
 Precath labs ordered. Shared Decision Making/Informed Consent Noted below

## 2024-01-12 NOTE — Assessment & Plan Note (Signed)
 Mild MR noted on echo, nothing on exam. Likely not causing any issue.  Continue to monitor. Treat BP accordingly.

## 2024-01-12 NOTE — Progress Notes (Signed)
 Cardiology Office Note:  .   Date:  01/12/2024  ID:  Roger [name], DOB Dec 17, 1955, MRN [id number] PCP: Roger Crick, MD  Roger [name]:  None     Chief Complaint  Patient presents with   New Patient (Initial Visit)    Evaluation of dyspnea and chest tightness   Coronary Artery Disease    PCI in 2004-has a card showing stent to the LCx.    Patient Profile: .     Roger [name] is an mildly obese 68 y.o. male non-smoker with a PMH notable for CAD-PCI LCx in 2004 hyperlipidemia who presents here for Exertional Dyspnea/"Cardiac Clearance" for Colonoscopy at the request of Roger Crick, MD.  PMH: CAD (cath for progressive angina-2004) severe LCx stenosis-DES PCI with Cordis 2.5 x 18 mm stent; was also noted to have additional 40 to 50% stenoses elsewhere.  Roger [name], [place] - [facility]) Hyperlipidemia Hypertension    Roger [name] was referred by Dr. Mariam [name] following his visit on November 28, 2023.  Apparently he had been seen by GI and had planned for surveillance colonoscopy.  Anesthesiology recommended cardiology follow-up "cardiac clearance "due to his history of CAD-PCI.  He been tried to have a change in diet with the eating healthier with less red meats and more vegetables more poultry and fish over the last several months but has been noticing intermittent exertional dyspnea with some chest tightness but not necessarily chest "pain ".  No palpitations more than a fluttering sensation.  No nausea or diaphoresis.  Just generally feeling poorly.  2D echo ordered-reviewed below  Subjective  Discussed the use of AI scribe software for clinical note transcription with the patient, who gave verbal consent to proceed.  History of Present Illness Roger [name] is a 68 year old male with coronary artery disease who presents with chest tightness and dyspnea on exertion. [name], Roger [name]. He was referred by Dr. Mariam [name] for evaluation of his  cardiac symptoms.  He experiences intermittent chest tightness and shortness of breath, particularly during physical activities such as walking. A significant episode occurred last Saturday while working at [employer], where he felt hot, sweaty, and faint, prompting him to leave work. During this episode, he did not experience chest pain but felt short of breath and faint. His blood pressure was elevated at home following this event, measuring around 180/95.  He has a history of coronary artery disease with a stent placed in 2004 due to chest pain. He was on Plavix for a year post-stent and has been on aspirin  since. He has not had regular cardiology follow-up since 2012. No recent episodes of chest pain similar to those experienced in 2004 but notes occasional chest tightness and shortness of breath with exertion, such as walking in the park or at the mall.  He has a history of hypothyroidism and hyperlipidemia, for which he takes medication. He denies any history of high blood pressure, although he notes recent elevated readings. He also reports cold feet frequently but denies any leg swelling or pain on exertion.  He had an echocardiogram recently. No heart racing, palpitations, or syncope, although he feels tired frequently.  He was evaluated for sleep apnea in the past, which was negative. He experiences nasal drip and allergies, which he attributes to occasional episodes of waking up feeling like he is choking.  Cardiovascular ROS: positive for - chest pain, dyspnea on exertion, irregular heartbeat, and sensation of feeling lightheaded and dizzy with borderline near syncope on  occasion.  Not associate with tachycardia. negative for - edema, loss of consciousness, orthopnea, palpitations, paroxysmal nocturnal dyspnea, rapid heart rate, shortness of breath, or TIA/amaurosis fugax, claudication  ROS:  Review of Systems - Negative except symptoms noted above in HPI    Objective   Current Meds   Medication Sig   amLODipine  (NORVASC ) 5 MG tablet Take 1 tablet (5 mg total) by mouth daily.   aspirin  EC 81 MG tablet Take 1 tablet (81 mg total) by mouth daily. Swallow whole.   atorvastatin  (LIPITOR) 20 MG tablet Take 1 tablet (20 mg total) by mouth daily.   levothyroxine  (SYNTHROID ) 50 MCG tablet Take 1 tablet (50 mcg total) by mouth daily.   [DISCONTINUED] famotidine  (PEPCID ) 20 MG tablet Take 1 tablet (20 mg total) by mouth at bedtime as needed for heartburn or indigestion.     Studies Reviewed: Roger [name]   EKG Interpretation Date/Time:  Monday Jan 12 2024 08:43:02 EDT Ventricular Rate:  57 PR Interval:  172 QRS Duration:  84 QT Interval:  404 QTC Calculation: 393 R Axis:   22  Text Interpretation: Sinus bradycardia Nonspecific T wave abnormality No previous ECGs available Confirmed by Roger [name] ([id number]) on 01/12/2024 8:49:15 AM    Lab Results  Component Value Date   CHOL 124 12/01/2023   HDL 35.30 (L) 12/01/2023   LDLCALC 65 12/01/2023   TRIG 121.0 12/01/2023   CHOLHDL 4 12/01/2023   Lab Results  Component Value Date   NA 139 05/23/2023   K 4.2 05/23/2023   CREATININE 1.07 05/23/2023   GFR 71.12 05/23/2023   GLUCOSE 76 05/23/2023   Lab Results  Component Value Date   WBC 9.1 10/18/2022   HGB 14.6 10/18/2022   HCT 42.8 10/18/2022   MCV 91.1 10/18/2022   PLT 247.0 10/18/2022   ECHO 01/05/2024: EF 55 to 60%.  No RWMA.  Indeterminate diastolic parameters.  Normal RV size and function.  Mild LA dilation.  Mild MR.  AV sclerosis with no stenosis.  Normal RAP.   Risk Assessment/Calculations:     HYPERTENSION CONTROL Vitals:   01/12/24 0839 01/12/24 0910  BP: (!) 150/80 (!) 146/78    The patient's blood pressure is elevated above target today.  In order to address the patient's elevated BP: A new medication was prescribed today. (Adding amlodipine  5 mg daily)          Physical Exam:   VS:  BP (!) 146/78   Pulse (!) 57   Ht 5\' 3"  (1.6 m)   Wt 180 lb 1 oz  (81.7 kg)   SpO2 97%   BMI 31.90 kg/m    Wt Readings from Last 3 Encounters:  01/12/24 180 lb 1 oz (81.7 kg)  11/28/23 178 lb 8 oz (81 kg)  09/11/23 180 lb (81.6 kg)    GEN: Well nourished, well groomed in no acute distress; healthy-appearing.  Mildly obese NECK: No JVD; No carotid bruits CARDIAC: RRR; Normal S1, S2; no murmurs, rubs, gallops RESPIRATORY:  Clear to auscultation without rales, wheezing or rhonchi ; nonlabored, good air movement. ABDOMEN: Soft, non-tender, non-distended EXTREMITIES:  No edema; No deformity      ASSESSMENT AND PLAN: .    Problem List Items Addressed This Visit       Cardiology Problems   Coronary artery disease involving native coronary artery of native heart with angina pectoris (HCC) - Primary (Chronic)   Intermittent chest tightness and dyspnea on exertion suggestive of angina.  Recent presyncope and hypertension.  Echocardiogram shows normal left ventricular ejection fraction and mild left atrial dilation.  Possible progression of coronary artery disease since stent placement in 2004. Differential includes restenosis or new disease.  History of PCI-LCx in 2004 due to angina. Current symptoms may indicate restenosis or new disease. Cardiac catheterization planned for definitive evaluation due to symptoms and history. Informed consent discussed, including risks of bleeding, arrhythmia, myocardial infarction, stroke, and potential need for stenting or bypass surgery. Benefits include definitive diagnosis and potential treatment. Stress test considered but deemed less reliable given symptoms. - Schedule cardiac catheterization with Dr. Swaziland for Wednesday. - Prescribe amlodipine  5 mg daily for angina benefit. - Ensure availability of nitroglycerin  for symptomatic relief. - Continue aspirin  81 mg daily along with atorvastatin  20 mg daily. - With resting heart rate of 57 bpm, will hold off on beta-blocker.      Relevant Medications   amLODipine   (NORVASC ) 5 MG tablet   Other Relevant Orders   CBC   Basic metabolic panel with GFR   Essential hypertension   Elevated blood pressure during recent episodes of angina and presyncope. Likely contributing to cardiac symptoms and mild mitral valve regurgitation on echocardiogram. - Prescribe amlodipine  5 mg daily for blood pressure control.      Relevant Medications   amLODipine  (NORVASC ) 5 MG tablet   Hyperlipidemia with target low density lipoprotein (LDL) cholesterol less than 55 mg/dL (Chronic)   With known CAD, actual goal is less than 55 as opposed to less than 70.   He is currently on atorvastatin  20 mg daily with an LDL of 65. Pending results on cardiac catheterization may want to be more aggressive by either increasing to 40 mg daily or converting to rosuvastatin.      Relevant Medications   amLODipine  (NORVASC ) 5 MG tablet   Mild mitral regurgitation by prior echocardiography (Chronic)   Mild MR noted on echo, nothing on exam. Likely not causing any issue.  Continue to monitor. Treat BP accordingly.      Relevant Medications   amLODipine  (NORVASC ) 5 MG tablet   Progressive angina (HCC) (Chronic)   Now started to have some chest tightness on exertion as well as dyspnea.  They cannot murmur back to 2004 when he had his PCI, but symptoms seem to be similar. Symptoms at work occurring with more frequency and more prominent now with the chest discomfort as well. Definitive testing with coronary angiography recommended, as stress test would not definitively if the question. - Schedule cardiac catheterization with Dr. Swaziland for Wednesday (01/14/2024).  See informed consent below - Prescribe amlodipine  5 mg daily. - Ensure availability of nitroglycerin  for symptomatic relief. -       Relevant Medications   amLODipine  (NORVASC ) 5 MG tablet   Other Relevant Orders   CBC   Basic metabolic panel with GFR     Other   Dyspnea on exertion (Chronic)   Exertional dyspnea with  now having chest tightness.   Echo otherwise normal, therefore need to exclude ischemic etiology. Less likely related to deconditioning which would be diagnosis of exclusion following cath      Relevant Orders   EKG 12-Lead (Completed)   CBC   Basic metabolic panel with GFR   Pre-op evaluation   Precath labs ordered. Shared Decision Making/Informed Consent Noted below      Relevant Orders   EKG 12-Lead (Completed)   CBC   Basic metabolic panel with GFR        Informed Consent  Shared Decision Making/Informed Consent The risks [stroke (1 in 1000), death (1 in 1000), kidney failure [usually temporary] (1 in 500), bleeding (1 in 200), allergic reaction [possibly serious] (1 in 200)], benefits (diagnostic support and management of coronary artery disease) and alternatives of a cardiac catheterization were discussed in detail with Mr. Astorga and he is willing to proceed.      Follow-Up: Return in about 2 weeks (around 01/26/2024) for Post cath visit with APP, [employer]. Plan for post-cardiac catheterization evaluation and management of cardiac symptoms. - Schedule follow-up appointment with PA post-cardiac catheterization to assess recovery and discuss further management.  Recording duration: 33 minutes; 30 minutes reviewing chart and additional data.  Total time 63 minutes      Signed, Arleen Lacer, MD, MS Roger [name], M.D., M.S. Interventional Chartered certified accountant  Pager # [phone]

## 2024-01-13 ENCOUNTER — Telehealth: Payer: Self-pay | Admitting: *Deleted

## 2024-01-13 LAB — BASIC METABOLIC PANEL WITH GFR
BUN/Creatinine Ratio: 11 (ref 10–24)
BUN: 11 mg/dL (ref 8–27)
CO2: 20 mmol/L (ref 20–29)
Calcium: 9 mg/dL (ref 8.6–10.2)
Chloride: 104 mmol/L (ref 96–106)
Creatinine, Ser: 1 mg/dL (ref 0.76–1.27)
Glucose: 83 mg/dL (ref 70–99)
Potassium: 4.5 mmol/L (ref 3.5–5.2)
Sodium: 141 mmol/L (ref 134–144)
eGFR: 81 mL/min/{1.73_m2} (ref >59)

## 2024-01-13 LAB — CBC
Hematocrit: 41 % (ref 37.5–51.0)
Hemoglobin: 13.7 g/dL (ref 13.0–17.7)
MCH: 31.2 pg (ref 26.6–33.0)
MCHC: 33.4 g/dL (ref 31.5–35.7)
MCV: 93 fL (ref 79–97)
Platelets: 212 10*3/uL (ref 150–450)
RBC: 4.39 x10E6/uL (ref 4.14–5.80)
RDW: 13.8 % (ref 11.6–15.4)
WBC: 8.4 10*3/uL (ref 3.4–10.8)

## 2024-01-13 NOTE — Telephone Encounter (Signed)
 Cardiac Catheterization scheduled at [facility] for: Wednesday Jan 14, 2024 8:30 AM Arrival time [facility] Main Entrance A at: 6:30 AM  Nothing to eat after midnight prior to procedure, clear liquids until 5 AM day of procedure.  Medication instructions: -Usual morning medications can be taken with sips of water including aspirin  81 mg.  Plan to go home the same day, you will only stay overnight if medically necessary.  You must have responsible adult to drive you home.  Someone must be with you the first 24 hours after you arrive home.  Reviewed procedure instructions with patient's wife (DPR), [name].

## 2024-01-14 ENCOUNTER — Other Ambulatory Visit: Payer: Self-pay

## 2024-01-14 ENCOUNTER — Ambulatory Visit (HOSPITAL_COMMUNITY)
Admission: RE | Admit: 2024-01-14 | Discharge: 2024-01-14 | Disposition: A | Discharge status: Home / Self Care | Attending: Cardiology | Admitting: Cardiology

## 2024-01-14 ENCOUNTER — Encounter (HOSPITAL_COMMUNITY)
Admission: RE | Disposition: A | Discharge status: Home / Self Care | Payer: Self-pay | Source: Home / Self Care | Attending: Cardiology

## 2024-01-14 DIAGNOSIS — Z7982 Long term (current) use of aspirin: Secondary | ICD-10-CM | POA: Diagnosis not present

## 2024-01-14 DIAGNOSIS — E785 Hyperlipidemia, unspecified: Secondary | ICD-10-CM | POA: Diagnosis not present

## 2024-01-14 DIAGNOSIS — I25118 Atherosclerotic heart disease of native coronary artery with other forms of angina pectoris: Secondary | ICD-10-CM | POA: Diagnosis not present

## 2024-01-14 DIAGNOSIS — R0609 Other forms of dyspnea: Secondary | ICD-10-CM

## 2024-01-14 DIAGNOSIS — Z01818 Encounter for other preprocedural examination: Secondary | ICD-10-CM

## 2024-01-14 DIAGNOSIS — I10 Essential (primary) hypertension: Secondary | ICD-10-CM | POA: Insufficient documentation

## 2024-01-14 DIAGNOSIS — I200 Unstable angina: Secondary | ICD-10-CM

## 2024-01-14 DIAGNOSIS — Z79899 Other long term (current) drug therapy: Secondary | ICD-10-CM | POA: Insufficient documentation

## 2024-01-14 DIAGNOSIS — I25119 Atherosclerotic heart disease of native coronary artery with unspecified angina pectoris: Secondary | ICD-10-CM

## 2024-01-14 HISTORY — PX: LEFT HEART CATH AND CORONARY ANGIOGRAPHY: CATH118249

## 2024-01-14 HISTORY — PX: CORONARY PRESSURE/FFR WITH 3D MAPPING: CATH118309

## 2024-01-14 LAB — POCT ACTIVATED CLOTTING TIME: Activated Clotting Time: 262 s

## 2024-01-14 SURGERY — LEFT HEART CATH AND CORONARY ANGIOGRAPHY
Anesthesia: LOCAL

## 2024-01-14 MED ORDER — LIDOCAINE HCL (PF) 1 % IJ SOLN
INTRAMUSCULAR | Status: AC
  Filled 2024-01-14: qty 30

## 2024-01-14 MED ORDER — HEPARIN (PORCINE) IN NACL 1000-0.9 UT/500ML-% IV SOLN
INTRAVENOUS | Status: DC | PRN
  Administered 2024-01-14 (×2): 500 mL

## 2024-01-14 MED ORDER — HEPARIN SODIUM (PORCINE) 1000 UNIT/ML IJ SOLN
INTRAMUSCULAR | Status: AC
  Filled 2024-01-14: qty 10

## 2024-01-14 MED ORDER — NITROGLYCERIN 1 MG/10 ML FOR IR/CATH LAB
INTRA_ARTERIAL | Status: DC | PRN
  Administered 2024-01-14: 200 ug via INTRACORONARY

## 2024-01-14 MED ORDER — IOHEXOL 350 MG/ML SOLN
INTRAVENOUS | Status: DC | PRN
Start: 2024-01-14 — End: 2024-01-14
  Administered 2024-01-14: 75 mL

## 2024-01-14 MED ORDER — ASPIRIN 81 MG PO CHEW: 81.0000 mg | CHEWABLE_TABLET | Freq: Once | ORAL | Status: DC

## 2024-01-14 MED ORDER — ASPIRIN 81 MG PO TBEC: 81.0000 mg | DELAYED_RELEASE_TABLET | Freq: Every day | ORAL | Status: DC

## 2024-01-14 MED ORDER — NITROGLYCERIN 1 MG/10 ML FOR IR/CATH LAB
INTRA_ARTERIAL | Status: DC
Start: 2024-01-14 — End: 2024-01-14
  Filled 2024-01-14: qty 10

## 2024-01-14 MED ORDER — MIDAZOLAM HCL 2 MG/2ML IJ SOLN
INTRAMUSCULAR | Status: AC
  Filled 2024-01-14: qty 2

## 2024-01-14 MED ORDER — LIDOCAINE HCL (PF) 1 % IJ SOLN
INTRAMUSCULAR | Status: DC | PRN
  Administered 2024-01-14: 2 mL via INTRADERMAL

## 2024-01-14 MED ORDER — FENTANYL CITRATE (PF) 100 MCG/2ML IJ SOLN
INTRAMUSCULAR | Status: DC | PRN
  Administered 2024-01-14: 25 ug via INTRAVENOUS

## 2024-01-14 MED ORDER — VERAPAMIL HCL 2.5 MG/ML IV SOLN
INTRAVENOUS | Status: AC
  Filled 2024-01-14: qty 2

## 2024-01-14 MED ORDER — SODIUM CHLORIDE 0.9 % WEIGHT BASED INFUSION
1.0000 mL/kg/h | INTRAVENOUS | Status: DC
Start: 2024-01-14 — End: 2024-01-14

## 2024-01-14 MED ORDER — HEPARIN SODIUM (PORCINE) 1000 UNIT/ML IJ SOLN
INTRAMUSCULAR | Status: DC | PRN
  Administered 2024-01-14: 4000 [IU] via INTRAVENOUS
  Administered 2024-01-14: 5000 [IU] via INTRAVENOUS

## 2024-01-14 MED ORDER — VERAPAMIL HCL 2.5 MG/ML IV SOLN
INTRAVENOUS | Status: DC | PRN
  Administered 2024-01-14: 10 mL via INTRA_ARTERIAL

## 2024-01-14 MED ORDER — ISOSORBIDE MONONITRATE ER 30 MG PO TB24: 30.0000 mg | ORAL_TABLET | Freq: Every day | ORAL | 3 refills | Status: DC

## 2024-01-14 MED ORDER — FENTANYL CITRATE (PF) 100 MCG/2ML IJ SOLN
INTRAMUSCULAR | Status: AC
  Filled 2024-01-14: qty 2

## 2024-01-14 MED ORDER — SODIUM CHLORIDE 0.9 % WEIGHT BASED INFUSION
3.0000 mL/kg/h | INTRAVENOUS | Status: AC
Start: 2024-01-14 — End: 2024-01-14

## 2024-01-14 MED ORDER — MIDAZOLAM HCL 2 MG/2ML IJ SOLN
INTRAMUSCULAR | Status: DC | PRN
  Administered 2024-01-14: 2 mg via INTRAVENOUS

## 2024-01-14 SURGICAL SUPPLY — 12 items
CATH 5FR JL3.5 JR4 ANG PIG MP (CATHETERS) IMPLANT
CATH INFINITI 5FR JL4 (CATHETERS) IMPLANT
CATH LAUNCHER 5F EBU3.5 (CATHETERS) IMPLANT
DEVICE RAD COMP TR BAND LRG (VASCULAR PRODUCTS) IMPLANT
GLIDESHEATH SLEND SS 6F .021 (SHEATH) IMPLANT
GUIDEWIRE INQWIRE 1.5J.035X260 (WIRE) IMPLANT
GUIDEWIRE PRESSURE X 175 (WIRE) IMPLANT
KIT ESSENTIALS PG (KITS) IMPLANT
KIT SYRINGE INJ CVI SPIKEX1 (MISCELLANEOUS) IMPLANT
PACK CARDIAC CATHETERIZATION (CUSTOM PROCEDURE TRAY) ×1 IMPLANT
SET ATX-X65L (MISCELLANEOUS) IMPLANT
SHEATH PROBE COVER 6X72 (BAG) IMPLANT

## 2024-01-14 NOTE — Discharge Instructions (Addendum)
 Add Imdur  30 mg daily  Drink plenty of fluids for 48 hours and keep wrist elevated at heart level for 24 hours  Radial Site Care   This sheet gives you information about how to care for yourself after your procedure. Your health care provider may also give you more specific instructions. If you have problems or questions, contact your health care provider. What can I expect after the procedure? After the procedure, it is common to have: Bruising and tenderness at the catheter insertion area. Follow these instructions at home: Medicines Take over-the-counter and prescription medicines only as told by your health care provider. Insertion site care Follow instructions from your health care provider about how to take care of your insertion site. Make sure you: Wash your hands with soap and water before you change your bandage (dressing). If soap and water are not available, use hand sanitizer. Remove your dressing as told by your health care provider. In 24 hours Check your insertion site every day for signs of infection. Check for: Redness, swelling, or pain. Fluid or blood. Pus or a bad smell. Warmth. Do not take baths, swim, or use a hot tub until your health care provider approves. You may shower 24-48 hours after the procedure, or as directed by your health care provider. Remove the dressing and gently wash the site with plain soap and water. Pat the area dry with a clean towel. Do not rub the site. That could cause bleeding. Do not apply powder or lotion to the site. Activity   For 24 hours after the procedure, or as directed by your health care provider: Do not flex or bend the affected arm. Do not push or pull heavy objects with the affected arm. Do not drive yourself home from the hospital or clinic. You may drive 24 hours after the procedure unless your health care provider tells you not to. Do not operate machinery or power tools. Do not lift anything that is heavier than  10 lb (4.5 kg), or the limit that you are told, until your health care provider says that it is safe.  For 4 days Ask your health care provider when it is okay to: Return to work or school. Resume usual physical activities or sports. Resume sexual activity. General instructions If the catheter site starts to bleed, raise your arm and put firm pressure on the site. If the bleeding does not stop, get help right away. This is a medical emergency. If you went home on the same day as your procedure, a responsible adult should be with you for the first 24 hours after you arrive home. Keep all follow-up visits as told by your health care provider. This is important. Contact a health care provider if: You have a fever. You have redness, swelling, or yellow drainage around your insertion site. Get help right away if: You have unusual pain at the radial site. The catheter insertion area swells very fast. The insertion area is bleeding, and the bleeding does not stop when you hold steady pressure on the area. Your arm or hand becomes pale, cool, tingly, or numb. These symptoms may represent a serious problem that is an emergency. Do not wait to see if the symptoms will go away. Get medical help right away. Call your local emergency services (911 in the [place] Do not drive yourself to the hospital. Summary After the procedure, it is common to have bruising and tenderness at the site. Follow instructions from your health care provider  about how to take care of your radial site wound. Check the wound every day for signs of infection. Do not lift anything that is heavier than 10 lb (4.5 kg), or the limit that you are told, until your health care provider says that it is safe. This information is not intended to replace advice given to you by your health care provider. Make sure you discuss any questions you have with your health care provider. Document Revised: 09/17/2017 Document Reviewed:  09/17/2017 Elsevier Patient Education  2020 [employer].

## 2024-01-14 NOTE — Progress Notes (Signed)
 Patients TR band removed at 1130, gauze dressing applied. Right radial level 0, clean, dry, and intact.

## 2024-01-14 NOTE — Interval H&P Note (Signed)
 History and Physical Interval Note:  01/14/2024 7:04 AM  Roger [name]  has presented today for surgery, with the diagnosis of angina - cad.  The various methods of treatment have been discussed with the patient and family. After consideration of risks, benefits and other options for treatment, the patient has consented to  Procedure(s): LEFT HEART CATH AND CORONARY ANGIOGRAPHY (N/A) as a surgical intervention.  The patient's history has been reviewed, patient examined, no change in status, stable for surgery.  I have reviewed the patient's chart and labs.  Questions were answered to the patient's satisfaction.    Cath Lab Visit (complete for each Cath Lab visit)  Clinical Evaluation Leading to the Procedure:   ACS: No.  Non-ACS:    Anginal Classification: CCS II  Anti-ischemic medical therapy: Minimal Therapy (1 class of medications)  Non-Invasive Test Results: No non-invasive testing performed  Prior CABG: No previous CABG       Donata Fryer [facility] 01/14/2024 7:04 AM

## 2024-01-15 ENCOUNTER — Encounter (HOSPITAL_COMMUNITY): Payer: Self-pay | Admitting: Cardiology

## 2024-01-27 ENCOUNTER — Telehealth (HOSPITAL_COMMUNITY): Payer: Self-pay

## 2024-01-27 NOTE — Telephone Encounter (Signed)
 Called and spoke with pt wife Roger [name], who stated pt is interested in CR. Explained scheduling process.

## 2024-01-27 NOTE — Telephone Encounter (Signed)
 Pt insurance is active and benefits verified through [facility]. Co-pay $15.00, DED $0.00/$0.00 met, out of pocket $3,670.00/$125.00 met, co-insurance 0%. No pre-authorization required. Jay[employer], 01/27/24 @ 2:53PM, REF#[id number]   How many CR sessions are covered? (36 visits for TCR, 72 visits for ICR)72 Is this a lifetime maximum or an annual maximum? Annual Has the member used any of these services to date? No Is there a time limit (weeks/months) on start of program and/or program completion? No

## 2024-02-02 NOTE — Progress Notes (Signed)
 " Cardiology Office Note    Patient Name: Roger [name] Date of Encounter: 02/02/2024  Primary Care Provider:  Rilla Baller, MD Primary Cardiologist:  None Primary Electrophysiologist: None   Past Medical History    Past Medical History:  Diagnosis Date   Bell's palsy x2   recurrent   CAD (coronary artery disease) 2004   with stent, unclear details   Cataract    OD>OS per notes from [facility]   COVID-19 virus infection 03/2020   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    History of chicken pox    History of hypothyroidism as a child   HLD (hyperlipidemia)    Thyroid  disease     History of Present Illness  Roger [name] is a 68 y.o. male with a PMH of CAD s/p PCI to left circumflex in 2004, HLD, HTN, hypothyroidism who presents today for post left heart cath follow-up.  Roger [name] was seen initially by Dr. Anner on 01/12/2024 as a new patient for evaluation of dyspnea and chest tightness.  He has a past medical history of CAD with PCI in 2004 to left circumflex.  During his visit he reported chest tightness and shortness of breath with elevation in blood pressure.  2D echo was completed showing EF of 55 to 60% no RWMA and mild dilated LA aortic sclerosis with no stenosis.  Due to his history of previous stent and evolving symptoms he underwent LHC for further evaluation that showed three-vessel obstructive disease with no targets for PCI and recommendation of aggressive medical therapy possible consideration for CABG.  He was discharged with Lipitor, Imdur  30 mg daily.  Roger [name] presents today with his wife for post PCI follow-up. He is on medical therapy including isosorbide , Lipitor, aspirin , and amlodipine . He experiences chest discomfort, described as a sharp pain under the left rib cage, particularly when walking. This pain occurred during walks in the park on both elevated and flat terrain. Nitroglycerin  taken twice over five minutes alleviated  the pain. He also experiences a burning sensation in his chest when walking in hot weather, similar to previous episodes. This sensation is reproducible with activity, and he uses nitroglycerin  as needed. He has retired from his part-time job at [employer] due to his condition. He feels more tired than usual but denies dizziness, ankle swelling, or headaches. He is planning a cruise in August and is concerned about managing his symptoms during the trip.  This visit we also reviewed his heart cath images and results and patient had all questions answered to his satisfaction.  Patient denies palpitations, dyspnea, PND, orthopnea, nausea, vomiting, dizziness, syncope, edema, weight gain, or early satiety.  Discussed the use of AI scribe software for clinical note transcription with the patient, who gave verbal consent to proceed.  History of Present Illness   Review of Systems  Please see the history of present illness.    All other systems reviewed and are otherwise negative except as noted above.  Physical Exam     Wt Readings from Last 3 Encounters:  01/14/24 180 lb (81.6 kg)  01/12/24 180 lb 1 oz (81.7 kg)  11/28/23 178 lb 8 oz (81 kg)   CD:Uyzmz were no vitals filed for this visit.,There is no height or weight on file to calculate BMI. GEN: Well nourished, well developed in no acute distress Neck: No JVD; No carotid bruits Pulmonary: Clear to auscultation without rales, wheezing or rhonchi  Cardiovascular: Normal rate. Regular rhythm.  Normal S1. Normal S2.  Right radial site clean dry and intact with no evidence of ecchymosis. Murmurs: There is no murmur.  ABDOMEN: Soft, non-tender, non-distended EXTREMITIES:  No edema; No deformity   EKG/LABS/ Recent Cardiac Studies   ECG personally reviewed by me today -none completed today  Risk Assessment/Calculations:          Lab Results  Component Value Date   WBC 8.4 01/12/2024   HGB 13.7 01/12/2024   HCT 41.0 01/12/2024   MCV 93  01/12/2024   PLT 212 01/12/2024   Lab Results  Component Value Date   CREATININE 1.00 01/12/2024   BUN 11 01/12/2024   NA 141 01/12/2024   K 4.5 01/12/2024   CL 104 01/12/2024   CO2 20 01/12/2024   Lab Results  Component Value Date   CHOL 124 12/01/2023   HDL 35.30 (L) 12/01/2023   LDLCALC 65 12/01/2023   LDLDIRECT 106.0 08/16/2019   TRIG 121.0 12/01/2023   CHOLHDL 4 12/01/2023    Lab Results  Component Value Date   HGBA1C 5.6 09/27/2019   Assessment & Plan    Assessment & Plan  1.  Coronary artery disease: S/P LHC revealing severe three-vessel disease with 100% occlusion in the circumflex artery and multiple 90% stenoses. No stenting targets.  -For his episodes of stable angina simple activities such as walking on level ground. - Increase isosorbide  to 60 mg daily. - Continue Lipitor 20 mg and aspirin   - Continue Norvasc  5 mg, consider increasing to 10 mg if needed. - Use nitroglycerin  as needed for chest pain, seek emergency care if three doses do not relieve symptoms. - Monitor response to medication adjustment and report persistent or worsening symptoms. - Consider referral to thoracic surgery for bypass evaluation if symptoms are refractory. - Discuss cardiac rehabilitation after reassessment in one month.  2.  Essential hypertension: - Patient's blood pressure today was stable at 128/60 - Norvasc  5 mg daily isosorbide  60 mg daily  3.  Hyperlipidemia: - Patient's last LDL cholesterol was 65 - Continue Lipitor 20 mg daily  4.  Hypothyroidism: - Continue current treatment plan per PCP    Cardiac Rehabilitation Eligibility Assessment       Disposition: Follow-up with None or APP in 1 months    Signed, Wyn Raddle, Jackee Shove, NP 02/02/2024, 5:57 PM [facility] "

## 2024-02-03 ENCOUNTER — Ambulatory Visit: Attending: Cardiology | Admitting: Nurse Practitioner

## 2024-02-03 ENCOUNTER — Encounter: Payer: Self-pay | Admitting: Nurse Practitioner

## 2024-02-03 VITALS — BP 128/60 | HR 76 | Ht 63.0 in | Wt 181.2 lb

## 2024-02-03 DIAGNOSIS — I25119 Atherosclerotic heart disease of native coronary artery with unspecified angina pectoris: Secondary | ICD-10-CM | POA: Diagnosis not present

## 2024-02-03 DIAGNOSIS — I10 Essential (primary) hypertension: Secondary | ICD-10-CM | POA: Diagnosis not present

## 2024-02-03 DIAGNOSIS — E785 Hyperlipidemia, unspecified: Secondary | ICD-10-CM

## 2024-02-03 DIAGNOSIS — E039 Hypothyroidism, unspecified: Secondary | ICD-10-CM | POA: Diagnosis not present

## 2024-02-03 MED ORDER — ISOSORBIDE MONONITRATE ER 60 MG PO TB24
60.0000 mg | ORAL_TABLET | Freq: Every day | ORAL | 1 refills | Status: DC
Start: 2024-02-03 — End: 2024-03-11

## 2024-02-03 NOTE — Patient Instructions (Addendum)
 Medication Instructions:  INCREASE Imdur  to 60mg  Take 1 tablet once a day, can take an additional 30mg  as needed for angina CALL THE OFFICE NEXT WEEK WITH AN UPDATE ON HOW THE MEDICATION IS WORKING   *If you need a refill on your cardiac medications before your next appointment, please call your pharmacy*  Lab Work: None ordered If you have labs (blood work) drawn today and your tests are completely normal, you will receive your results only by: MyChart Message (if you have MyChart) OR A paper copy in the mail If you have any lab test that is abnormal or we need to change your treatment, we will call you to review the results.  Testing/Procedures: None ordered  Follow-Up: At [facility], you and your health needs are our priority.  As part of our continuing mission to provide you with exceptional heart care, our providers are all part of one team.  This team includes your primary Cardiologist (physician) and Advanced Practice Providers or APPs (Physician Assistants and Nurse Practitioners) who all work together to provide you with the care you need, when you need it.  Your next appointment:   1 month(s)  Provider:   Randene Bustard, MD or APP  We recommend signing up for the patient portal called "MyChart".  Sign up information is provided on this After Visit Summary.  MyChart is used to connect with patients for Virtual Visits (Telemedicine).  Patients are able to view lab/test results, encounter notes, upcoming appointments, etc.  Non-urgent messages can be sent to your provider as well.   To learn more about what you can do with MyChart, go to [email or web address].   Other Instructions

## 2024-03-05 ENCOUNTER — Emergency Department

## 2024-03-05 ENCOUNTER — Emergency Department
Admission: EM | Admit: 2024-03-05 | Discharge: 2024-03-06 | Disposition: A | Discharge status: Home / Self Care | Source: Ambulatory Visit

## 2024-03-05 ENCOUNTER — Other Ambulatory Visit: Payer: Self-pay

## 2024-03-05 DIAGNOSIS — R9431 Abnormal electrocardiogram [ECG] [EKG]: Secondary | ICD-10-CM | POA: Diagnosis not present

## 2024-03-05 DIAGNOSIS — Z7982 Long term (current) use of aspirin: Secondary | ICD-10-CM | POA: Insufficient documentation

## 2024-03-05 DIAGNOSIS — H532 Diplopia: Secondary | ICD-10-CM | POA: Diagnosis not present

## 2024-03-05 DIAGNOSIS — I10 Essential (primary) hypertension: Secondary | ICD-10-CM | POA: Diagnosis not present

## 2024-03-05 DIAGNOSIS — H4923 Sixth [abducent] nerve palsy, bilateral: Secondary | ICD-10-CM | POA: Diagnosis not present

## 2024-03-05 DIAGNOSIS — E039 Hypothyroidism, unspecified: Secondary | ICD-10-CM | POA: Insufficient documentation

## 2024-03-05 DIAGNOSIS — I2510 Atherosclerotic heart disease of native coronary artery without angina pectoris: Secondary | ICD-10-CM | POA: Insufficient documentation

## 2024-03-05 DIAGNOSIS — H538 Other visual disturbances: Secondary | ICD-10-CM | POA: Diagnosis present

## 2024-03-05 DIAGNOSIS — G930 Cerebral cysts: Secondary | ICD-10-CM | POA: Diagnosis not present

## 2024-03-05 DIAGNOSIS — I6782 Cerebral ischemia: Secondary | ICD-10-CM | POA: Diagnosis not present

## 2024-03-05 DIAGNOSIS — G319 Degenerative disease of nervous system, unspecified: Secondary | ICD-10-CM | POA: Diagnosis not present

## 2024-03-05 DIAGNOSIS — H2513 Age-related nuclear cataract, bilateral: Secondary | ICD-10-CM | POA: Diagnosis not present

## 2024-03-05 DIAGNOSIS — R519 Headache, unspecified: Secondary | ICD-10-CM | POA: Diagnosis not present

## 2024-03-05 LAB — CBC
HCT: 40.1 % (ref 39.0–52.0)
Hemoglobin: 13.9 g/dL (ref 13.0–17.0)
MCH: 30.9 pg (ref 26.0–34.0)
MCHC: 34.7 g/dL (ref 30.0–36.0)
MCV: 89.1 fL (ref 80.0–100.0)
Platelets: 193 K/uL (ref 150–400)
RBC: 4.5 MIL/uL (ref 4.22–5.81)
RDW: 13.7 % (ref 11.5–15.5)
WBC: 7.6 K/uL (ref 4.0–10.5)
nRBC: 0 % (ref 0.0–0.2)

## 2024-03-05 LAB — TROPONIN I (HIGH SENSITIVITY)
Troponin I (High Sensitivity): 7 ng/L (ref <18)
Troponin I (High Sensitivity): 9 ng/L (ref <18)

## 2024-03-05 LAB — DIFFERENTIAL
Abs Immature Granulocytes: 0.04 K/uL (ref 0.00–0.07)
Basophils Absolute: 0.1 K/uL (ref 0.0–0.1)
Basophils Relative: 1 %
Eosinophils Absolute: 0.3 K/uL (ref 0.0–0.5)
Eosinophils Relative: 4 %
Immature Granulocytes: 1 %
Lymphocytes Relative: 32 %
Lymphs Abs: 2.4 K/uL (ref 0.7–4.0)
Monocytes Absolute: 0.9 K/uL (ref 0.1–1.0)
Monocytes Relative: 11 %
Neutro Abs: 4 K/uL (ref 1.7–7.7)
Neutrophils Relative %: 51 %

## 2024-03-05 LAB — COMPREHENSIVE METABOLIC PANEL WITH GFR
ALT: 25 U/L (ref 0–44)
AST: 20 U/L (ref 15–41)
Albumin: 3.9 g/dL (ref 3.5–5.0)
Alkaline Phosphatase: 94 U/L (ref 38–126)
Anion gap: 10 (ref 5–15)
BUN: 15 mg/dL (ref 8–23)
CO2: 22 mmol/L (ref 22–32)
Calcium: 8.8 mg/dL — ABNORMAL LOW (ref 8.9–10.3)
Chloride: 105 mmol/L (ref 98–111)
Creatinine, Ser: 0.85 mg/dL (ref 0.61–1.24)
GFR, Estimated: >60 mL/min (ref >60)
Glucose, Bld: 80 mg/dL (ref 70–99)
Potassium: 4 mmol/L (ref 3.5–5.1)
Sodium: 137 mmol/L (ref 135–145)
Total Bilirubin: 0.6 mg/dL (ref 0.0–1.2)
Total Protein: 7.3 g/dL (ref 6.5–8.1)

## 2024-03-05 LAB — CBG MONITORING, ED: Glucose-Capillary: 87 mg/dL (ref 70–99)

## 2024-03-05 LAB — APTT: aPTT: 28 s (ref 24–36)

## 2024-03-05 LAB — PROTIME-INR
INR: 1 (ref 0.8–1.2)
Prothrombin Time: 13.6 s (ref 11.4–15.2)

## 2024-03-05 LAB — ETHANOL: Alcohol, Ethyl (B): <15 mg/dL (ref <15)

## 2024-03-05 MED ORDER — LORAZEPAM 2 MG/ML IJ SOLN
0.5000 mg | Freq: Once | INTRAMUSCULAR | Status: AC | PRN
  Administered 2024-03-05: 0.5 mg via INTRAVENOUS
  Filled 2024-03-05: qty 1

## 2024-03-05 MED ORDER — GADOBUTROL 1 MMOL/ML IV SOLN
8.0000 mL | Freq: Once | INTRAVENOUS | Status: AC | PRN
  Administered 2024-03-05: 8 mL via INTRAVENOUS

## 2024-03-05 NOTE — ED Provider Triage Note (Signed)
 Emergency Medicine Provider Triage Evaluation Note  Roger [name] , a 68 y.o. male  was evaluated in triage.  Pt complains of blurry vision x 9 days, constant; intermittent HA, right eye esotropia.    Physical Exam  BP (!) 160/85   Pulse 67   Temp 98.1 F (36.7 C) (Oral)   Resp 18   SpO2 98%  Gen:   Awake, no distress   Resp:  Normal effort  MSK:   Moves extremities without difficulty  Other:  Symmetrical facial movements. Good strength b/l extremities.   Medical Decision Making  Medically screening exam initiated at 2:48 PM.  Appropriate orders placed.  Roger [name] was informed that the remainder of the evaluation will be completed by another provider, this initial triage assessment does not replace that evaluation, and the importance of remaining in the ED until their evaluation is complete.  EKG, CT head w/o contrast, CBC, APTT, CMP, troponin, Ethanol, protime-INR, CBG, NIH Stroke Scale, NPO   Sheron Salm, PA-C 03/05/24 1455

## 2024-03-05 NOTE — ED Notes (Signed)
 Patient transported to MRI

## 2024-03-05 NOTE — ED Triage Notes (Signed)
 Pt to ED via POV from [facility]. Pt reports blurry vision x9 days. Pt reports blurry vision has been constant. Pt also reports right eye turned inward. Pt reports intermittent HA. Pt with hx CAD and heart cath in May.

## 2024-03-05 NOTE — ED Provider Notes (Signed)
 11:20 PM  Assumed care at shift change.  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  MRI brain, MRV showed no acute abnormality.  Awaiting reevaluation by tele neuro for further recommendations.

## 2024-03-05 NOTE — Discharge Instructions (Addendum)
 Please follow-up with your ophthalmologist as needed.  Please follow-up with neurology.  Please continue aspirin  as prescribed.

## 2024-03-05 NOTE — ED Provider Notes (Signed)
 [facility] Provider Note    Event Date/Time   First MD Initiated Contact with Patient 03/05/24 1627     (approximate)   History   Blurred Vision  Pt to ED via POV from [facility]. Pt reports blurry vision x9 days. Pt reports blurry vision has been constant. Pt also reports right eye turned inward. Pt reports intermittent HA. Pt with hx CAD and heart cath in May.    HPI Roger [name] is a 68 y.o. male PMH CAD, hyperlipidemia, cranial nerve VI palsy (right), diplopia, Bell's palsy, hypothyroidism presents for evaluation of blurry vision -Patient was apparently seen in [facility] earlier today, sent to ED for further eval - Patient notes he has had double vision for about the past 9 days.  They were in [place]  at the time.  Denies any other abnormalities including any focal weakness.  Had some mild intermittent headaches.  No head trauma.  Has otherwise been in his usual state of health.   Per chart review, seen by cardiology on 02/03/2024.  Noted to have prior PCI 2004 to left circumflex.  Underwent left heart catheter anginal symptoms, showed three-vessel obstructive disease with no targets for PCI, recommended aggressive medical therapy and consideration of CABG.     Physical Exam   Triage Vital Signs: ED Triage Vitals [03/05/24 1443]  Encounter Vitals Group     BP (!) 160/85     Girls Systolic BP Percentile      Girls Diastolic BP Percentile      Boys Systolic BP Percentile      Boys Diastolic BP Percentile      Pulse Rate 67     Resp 18     Temp 98.1 F (36.7 C)     Temp Source Oral     SpO2 98 %     Weight      Height      Head Circumference      Peak Flow      Pain Score 3     Pain Loc      Pain Education      Exclude from Growth Chart     Most recent vital signs: Vitals:   03/05/24 2030 03/05/24 2234  BP: (!) 149/85 (!) 145/81  Pulse: 77 86  Resp:  16  Temp:  98.2 F (36.8 C)  SpO2: 99% 100%      General: Awake, no distress.  CV:  Good peripheral perfusion. RRR, RP 2+ Resp:  Normal effort. CTAB Abd:  No distention. Nontender to deep palpation throughout Neuro:  Aox4, CN II-XII intact except for cranial nerve VI palsy b/l --bilateral esotropia present with inability to overcome this on the left, FNF wnl, finger taps fast b/l, 5/5 strength in bilateral finger extension/grip, arm flexion/extension, EHL/FHL. BUE AG 10+ sec no drift, BLE AG 5+ sec no drift. Ambulates with steady gait. SILT. Negative Rhomberg.    ED Results / Procedures / Treatments   Labs (all labs ordered are listed, but only abnormal results are displayed) Labs Reviewed  COMPREHENSIVE METABOLIC PANEL WITH GFR - Abnormal; Notable for the following components:      Result Value   Calcium  8.8 (*)    All other components within normal limits  PROTIME-INR  APTT  CBC  DIFFERENTIAL  ETHANOL  CBG MONITORING, ED  TROPONIN I (HIGH SENSITIVITY)  TROPONIN I (HIGH SENSITIVITY)     EKG  Ecg = nsr, rate 64, no ste/std, no repolarization abnormality,  normal axis, normal intervals.  No clear evidence of ischemia nor arrhythmia on my read.  Isolated T wave inversion in lead III is nonspecific.   RADIOLOGY CT head interpreted by myself, no intracranial hemorrhage.  Radiology report reviewed, no acute abnormality identified.  MRI/MRV pending.  PROCEDURES:  Critical Care performed: No  Procedures   MEDICATIONS ORDERED IN ED: Medications  LORazepam  (ATIVAN ) injection 0.5 mg (0.5 mg Intravenous Given 03/05/24 2114)  gadobutrol  (GADAVIST ) 1 MMOL/ML injection 8 mL (8 mLs Intravenous Contrast Given 03/05/24 2147)     IMPRESSION / MDM / ASSESSMENT AND PLAN / ED COURSE  I reviewed the triage vital signs and the nursing notes.                              DDX/MDM/AP: Differential diagnosis includes, but is not limited to, CVA, consider intracranial mass or increased pressure, doubt electrolyte abnormality other  etiology.  Plan: - Labs - EKG - CT head - Plan to discuss with neurology  Patient's presentation is most consistent with acute presentation with potential threat to life or bodily function.  The patient is on the cardiac monitor to evaluate for evidence of arrhythmia and/or significant heart rate changes.  ED course below.  CT head negative.  Spoke with on-call neurologist who recommended MRI/MRV, can decide whether further inpatient workup or an outpatient follow-up is appropriate.  Doubts increased intracranial pressure though still within differential.  Patient signed out to oncoming ED provider pending MRI results.  Overnight teleneurology consulted, evaluation pending.  Clinical Course as of 03/05/24 2317  Fri Mar 05, 2024  1642 CTH: IMPRESSION: No CT evidence of acute intracranial abnormality.  Chronic microvascular ischemic changes.   [MM]  1643 CBC, coags, CMP reviewed, unremarkable.  Troponin normal. [MM]  1656 Neuro paged [MM]  1656 D/w Dr. Voncile of neuro - without notable obesity, unlikely increased intracranial pressure - consider brainstem involvement  Other causes include   MRI w/ and w/o If clear, unlikely stroke [MM]  2301 Neuro paged to review MRI [MM]    Clinical Course User Index [MM] Clarine Ozell LABOR, MD     FINAL CLINICAL IMPRESSION(S) / ED DIAGNOSES   Final diagnoses:  Cranial nerve VI palsy, bilateral  Diplopia     Rx / DC Orders   ED Discharge Orders     None        Note:  This document was prepared using Dragon voice recognition software and may include unintentional dictation errors.   Clarine Ozell LABOR, MD 03/05/24 [id number]

## 2024-03-06 MED ORDER — ASPIRIN 81 MG PO CHEW
324.0000 mg | CHEWABLE_TABLET | Freq: Once | ORAL | Status: AC
  Administered 2024-03-06: 324 mg via ORAL
  Filled 2024-03-06: qty 4

## 2024-03-06 NOTE — ED Notes (Signed)
 Neuro cart in room and Neurologist is doing video assessment with pt at this time.

## 2024-03-06 NOTE — Consult Note (Signed)
 [facility] TeleNeurology Consult Services  Stat Consult  Patient Name:   Roger [name], Roger [name] Date of Birth:   1956/04/08 Identification Number:   MRN - [id number] Date of Service:   03/05/2024 23:08:55  Diagnosis:       H53.2 - Diplopia  Impression 68 y/o man presented with 9 days of double vision. My virtual exam is very limited given the limitation of the cart (Difficult to pan and no zoom). It appears that he has L abudction deficits and most commonly isolated VI nerve palsy is like from ischemia of the nerve due to risk factors. Reassuring that all neuroimaging is negative. - FU outpatient with neurology and neuroophthalmology   Recommendations: Our recommendations are outlined below.    ----------------------------------------------------------------------------------------------------    Metrics: Dispatch Time: 03/05/2024 23:07:10 Callback Response Time: 03/05/2024 23:12:07  Primary Provider Notified of Diagnostic Impression and Management Plan on: 03/06/2024 01:02:13    Imaging IMPRESSION: MRI HEAD:  1. No acute intracranial abnormality. 2. Moderately advanced chronic microvascular ischemic disease with a few scattered remote lacunar infarcts about the hemispheric cerebral white matter. 3. Mild generalized cerebral atrophy.  MRV HEAD:  Negative intracranial MRV. No evidence for dural sinus thrombosis.    ----------------------------------------------------------------------------------------------------  Chief Complaint: double vision  History of Present Illness: Patient is a 68 year old Male. 68 y/o man with HTN, CAD presented with double vision. 9 days ago started having double vision out of the left eye. The right eye is cross eyed. Has been having headaches because of double vision. Had no HAs leading up to the double vision. Went to outpatient ophthalmologist and was sent to ER to rule out acute pathologies   Past Medical  History:      Hypertension      Coronary Artery Disease  Medications:  No Anticoagulant use  Antiplatelet use: Yes ASA Reviewed EMR for current medications  Allergies:  Reviewed  Social History: Patient Is: Married Smoking: No  Family History:  There is no family history of premature cerebrovascular disease pertinent to this consultation  ROS : 14 Points Review of Systems was performed and was negative except mentioned in HPI.  Past Surgical History: There Is No Surgical History Contributory To Today's Visit   Examination: BP(185/113), Pulse(79),  Neuro Exam: General: Alert,Awake, Oriented to Time, Place, Person  Speech: Fluent:  Language: Intact:  Face: Symmetric:  Facial Sensation: Intact:  Visual Fields: Intact:  Extraocular Movements: Intact:  Motor Exam: No Drift:  Sensation: Intact:  Coordination: Intact:  Has abduction deficit in the left eye. R eye appears to be able to abduct and adduct.   Spoke with : ER Provider    This consult was conducted in real time using interactive audio and Immunologist. Patient was informed of the technology being used for this visit and agreed to proceed. Patient located in hospital and provider located at home/office setting.  Patient is being evaluated for possible acute neurologic impairment and high probability of imminent or life - threatening deterioration.I spent total of 35 minutes providing care to this patient, including time for face to face visit via telemedicine, review of medical records, imaging studies and discussion of findings with providers, the patient and / or family.   Dr Johnathon Mana   [facility] For Inpatient follow-up with [facility] physician please call [facility] at [phone]. As we are not an outpatient service for any post hospital discharge needs please contact the hospital for assistance.  If you have any questions for the [facility] physicians or need to  reconsult for clinical or diagnostic changes please contact us  via [facility] at [phone].

## 2024-03-09 NOTE — Progress Notes (Deleted)
 Cardiology Office Note    Patient Name: Roger [name] Date of Encounter: 03/09/2024  Primary Care Provider:  Rilla Baller, MD Primary Cardiologist:  None Primary Electrophysiologist: None   Past Medical History    Past Medical History:  Diagnosis Date   Bell's palsy x2   recurrent   CAD (coronary artery disease) 2004   with stent, unclear details   Cataract    OD>OS per notes from [facility]   COVID-19 virus infection 03/2020   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    History of chicken pox    History of hypothyroidism as a child   HLD (hyperlipidemia)    Thyroid  disease     History of Present Illness  Roger [name] is a 68 y.o. male with a PMH of CAD s/p PCI to left circumflex in 2004, HLD, HTN, hypothyroidism who presents today for 1 month follow up.  Roger [name] was last seen on 02/03/2024 following recent heart catheterization.  During his visit he described ongoing angina with sharp pain under the left rib cage brought on with exertion.  He reported alleviation of symptoms with nitroglycerin .  He increased his Imdur  to 60 mg with instructions to contact our office if symptoms persist.  He was seen in the ED at [facility] on 03/05/2024 with complaint of blurry vision along with double vision.  He was advised to follow-up per his outpatient ophthalmologist to rule out acute etiology.  He completed CT of the head as well as MRI of the brain that showed [name] acute abnormalities.  He was given a 4 dose of aspirin  and advised to follow-up with neurology as outpatient.  He was seen by televisit on 03/06/2024 for diplopia  Patient denies chest pain, palpitations, dyspnea, PND, orthopnea, nausea, vomiting, dizziness, syncope, edema, weight gain, or early satiety.   Discussed the use of AI scribe software for clinical note transcription with the patient, who gave verbal consent to proceed.  History of Present Illness    ***Notes:   Review of Systems  Please  see the history of present illness.    All other systems reviewed and are otherwise negative except as noted above.  Physical Exam    Wt Readings from Last 3 Encounters:  02/03/24 181 lb 3.2 oz (82.2 kg)  01/14/24 180 lb (81.6 kg)  01/12/24 180 lb 1 oz (81.7 kg)   CD:Uyzmz were [name] vitals filed for this visit.,There is [name] height or weight on file to calculate BMI. GEN: Well nourished, well developed in [name] acute distress Neck: [name] JVD; [name] carotid bruits Pulmonary: Clear to auscultation without rales, wheezing or rhonchi  Cardiovascular: Normal rate. Regular rhythm. Normal S1. Normal S2.   Murmurs: There is [name] murmur.  ABDOMEN: Soft, non-tender, non-distended EXTREMITIES:  [name] edema; [name] deformity   EKG/LABS/ Recent Cardiac Studies   ECG personally reviewed by me today - ***  Risk Assessment/Calculations:   {Does this patient have ATRIAL FIBRILLATION?:[phone]}      Lab Results  Component Value Date   WBC 7.6 03/05/2024   HGB 13.9 03/05/2024   HCT 40.1 03/05/2024   MCV 89.1 03/05/2024   PLT 193 03/05/2024   Lab Results  Component Value Date   CREATININE 0.85 03/05/2024   BUN 15 03/05/2024   NA 137 03/05/2024   K 4.0 03/05/2024   CL 105 03/05/2024   CO2 22 03/05/2024   Lab Results  Component Value Date   CHOL 124 12/01/2023  HDL 35.30 (L) 12/01/2023   LDLCALC 65 12/01/2023   LDLDIRECT 106.0 08/16/2019   TRIG 121.0 12/01/2023   CHOLHDL 4 12/01/2023    Lab Results  Component Value Date   HGBA1C 5.6 09/27/2019   Assessment & Plan    Assessment and Plan Assessment & Plan     1.Coronary artery disease: S/P LHC revealing severe three-vessel disease with 100% occlusion in the circumflex artery and multiple 90% stenoses. [name] stenting targets.   2.Essential hypertension: - Patient's blood pressure today was   3.Hyperlipidemia: - Patient's last LDL cholesterol was 65 - Continue Lipitor 20 mg daily  4.Hypothyroidism: - Continue current treatment plan per  PCP  5.  History of diplopia:  {The patient has an active order for outpatient cardiac rehabilitation.   Please indicate if the patient is ready to start. Do NOT delete this.  It will auto delete.  Refresh note, then sign.              Click here to document readiness and see contraindications.  :1}  Cardiac Rehabilitation Eligibility Assessment       Disposition: Follow-up with None or APP in *** months {Are you ordering a CV Procedure (e.g. stress test, cath, DCCV, TEE, etc)?   Press F2        :[id number]}   Signed, Roger [name], Roger Shove, NP 03/09/2024, 9:04 AM [facility]

## 2024-03-10 ENCOUNTER — Ambulatory Visit: Admitting: Nurse Practitioner

## 2024-03-10 DIAGNOSIS — E785 Hyperlipidemia, unspecified: Secondary | ICD-10-CM

## 2024-03-10 DIAGNOSIS — H532 Diplopia: Secondary | ICD-10-CM

## 2024-03-10 DIAGNOSIS — I25119 Atherosclerotic heart disease of native coronary artery with unspecified angina pectoris: Secondary | ICD-10-CM

## 2024-03-10 DIAGNOSIS — I10 Essential (primary) hypertension: Secondary | ICD-10-CM

## 2024-03-10 DIAGNOSIS — E039 Hypothyroidism, unspecified: Secondary | ICD-10-CM

## 2024-03-10 NOTE — Progress Notes (Unsigned)
 Cardiology Office Note    Date:  03/11/2024  ID:  Roger [name], DOB Mar 07, 1956, MRN [id number] PCP:  Rilla Baller, MD  Cardiologist:  Alm Clay, MD  Electrophysiologist:  None   Chief Complaint: Follow up for CAD   History of Present Illness: .    Roger [name] is a 68 y.o. male with visit-pertinent history of CAD s/p PCI to the LCx in 2004, hyperlipidemia, hypertension, hypothyroidism.   Patient with history of CAD, he underwent catheter progressive angina in 2004, noted to have severe LCx stenosis with DES Cordis 2.5 x 18 mm stent was placed, noted to have additional 40 to 50% stenosis elsewhere.  Echo on 01/05/2024 indicated LVEF of 55 to 60%, no RWMA, diastolic parameters were indeterminate, RV systolic function and size was normal, LA was mildly dilated, mild mitral valve regurgitation, aortic valve sclerosis was present without any evidence of stenosis.  Patient was first seen by Dr. Clay on 01/12/2024 for cardiac clearance for a colonoscopy.  Patient reported experiencing intermittent chest tightness and shortness of breath, particular during physical activity such as walking.  Patient underwent cardiac catheterization with angiography on 01/14/2024, noted to have three-vessel obstructive CAD, diffuse segmental small vessel disease involving the 1st and 2nd diagonals, 2nd and 3rd OM's, PDA and PL branches.  Moderate proximal LAD stenosis with borderline RFR of 0.9.  Aggressive medical therapy was recommended, noted to have no good targets for PCI.  Noted that if he had refractory angina despite good medical therapy would need to consider CABG.  Patient was discharged on Lipitor and Imdur  30 mg daily.  Patient was seen in clinic on 02/03/2024 for post PCI follow-up.  Patient reported that he continued to experience chest discomfort described as a sharp pain under the left rib cage particularly when walking.  Patient's isosorbide  was increased to 60 mg daily.  On 03/05/2024  patient presented to the emergency room with multiple days of double vision.  CT head was negative, MRI brain, MRV showed no acute abnormality.  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  Is recommended that he follow-up with neurology outpatient.  Today he presents for follow-up.  He reports that he is overall doing well.  Patient reports that he has had significant improvement in how he is feeling with increased dose of Imdur .  He reports 1 episode of chest pressure in the last month, reports this resolved with a sitting goal sublingual nitroglycerin , denies recurrence.  He does report an intermittent sensation of having a knot on the right side of his chest that is intermittent, not associated with exertion, he questions if this is radiation from his heart.  Reports that he has not taken any sublingual nitroglycerin  for discomfort as it typically resolves quickly.  He denies any shortness of breath, lower extremity edema, orthopnea or PND.  He does note some intermittent palpitations described as a fluttering sensation that last for a few seconds occurring a few times a week.  Denies any associated symptoms.  Patient reports that his biggest concern is regarding his ongoing double vision, he is to follow-up with neurology in August, questions if related to his previous Bell's palsy.   ROS: .   Today he denies shortness of breath, lower extremity edema, fatigue, melena, hematuria, hemoptysis, diaphoresis, weakness,  presyncope, syncope, orthopnea, and PND.  All other systems are reviewed and otherwise negative. Studies Reviewed: SABRA   EKG:  EKG is ordered today, personally reviewed, demonstrating  EKG Interpretation Date/Time:  Thursday March 11 2024 08:22:02 EDT Ventricular Rate:  64 PR  Interval:  172 QRS Duration:  98 QT Interval:  412 QTC Calculation: 425 R Axis:   28  Text Interpretation: Normal sinus rhythm Normal ECG When compared with ECG of 05-Mar-2024 14:46, T wave inversion no longer evident in Inferior leads Confirmed by Sheray Grist [phone]) on 03/11/2024 8:26:20 AM   CV Studies: Cardiac studies reviewed are outlined and summarized above. Otherwise please see EMR for full report. Cardiac Studies & Procedures   ______________________________________________________________________________________________ CARDIAC CATHETERIZATION  CARDIAC CATHETERIZATION 01/14/2024  Conclusion   Prox RCA lesion is 40% stenosed.   RPDA-1 lesion is 90% stenosed.   RPDA-2 lesion is 90% stenosed.   1st RPL lesion is 90% stenosed.   Prox LAD lesion is 70% stenosed.   Mid LAD lesion is 50% stenosed.   1st Diag lesion is 90% stenosed.   2nd Diag lesion is 90% stenosed.   2nd Mrg lesion is 100% stenosed.   Mid Cx to Dist Cx lesion is 100% stenosed.   LV end diastolic pressure is mildly elevated.  3 vessel obstructive CAD. Diffuse segmental small vessel disease involving the first and second diagonals, second and third OMs, PDA and PL branches. Moderate proximal LAD stenosis with borderline RFR 0.9 Mildly elevated LVEDP  Plan: recommend aggressive medical therapy. No good targets for PCI. If he has refractory angina despite good medical therapy would need to consider CABG  Findings Coronary Findings Diagnostic  Dominance: Right  Left Main Vessel was injected. Vessel is normal in caliber. There is mild focal disease in the vessel.  Left Anterior Descending Prox LAD lesion is 70% stenosed. Pressure gradient was performed on the lesion. RFR: 0.9. Mid LAD lesion is 50% stenosed.  First Diagonal Branch 1st Diag lesion is 90% stenosed.  Second Diagonal Branch 2nd Diag lesion is 90% stenosed.  Left Circumflex Mid Cx to Dist Cx lesion is 100% stenosed. The lesion was previously  treated .  Second Obtuse Marginal Branch 2nd Mrg lesion is 100% stenosed.  Right Coronary Artery Prox RCA lesion is 40% stenosed.  Right Posterior Descending Artery RPDA-1 lesion is 90% stenosed. RPDA-2 lesion is 90% stenosed.  First Right Posterolateral Branch 1st RPL lesion is 90% stenosed.  Intervention  No interventions have been documented.     ECHOCARDIOGRAM  ECHOCARDIOGRAM COMPLETE 01/05/2024  Narrative ECHOCARDIOGRAM REPORT    Patient Name:   Roger [name] Date of Exam: 01/05/2024 Medical Rec #:  [id number]      Height:       63.3 in Accession #:    [id number]     Weight:       178.5 lb Date of Birth:  01-Aug-1956     BSA:          1.848 m Patient Age:    67 years       BP:           152/66 mmHg Patient Gender: M              HR:           60 bpm. Exam Location:  [address]  Procedure: 2D Echo, Color Doppler, Cardiac Doppler and 3D Echo (Both Spectral and Color Flow Doppler were utilized during procedure).  Indications:    Murmur R01.1  History:        Patient has no prior history of Echocardiogram examinations. CAD, Coronary Stent intervention; Risk Factors:Dyslipidemia.  Sonographer:    Augustin Seals RDCS Referring Phys: [id number] JAVIER GUTIERREZ  IMPRESSIONS   1. Left ventricular ejection fraction, by estimation, is 55 to 60%. The left ventricle has normal function. The left ventricle has no regional wall motion abnormalities. Left ventricular diastolic parameters are indeterminate. 2. Right ventricular systolic function is normal. The right ventricular size is normal. 3. Left atrial size was mildly dilated. 4. Mild mitral valve regurgitation. 5. The aortic valve is tricuspid. Aortic valve regurgitation is not visualized. Aortic valve sclerosis/calcification is present, without any evidence of aortic stenosis. 6. The inferior vena cava is normal in size with greater than 50% respiratory variability, suggesting right atrial pressure of 3  mmHg.  FINDINGS Left Ventricle: Left ventricular ejection fraction, by estimation, is 55 to 60%. The left ventricle has normal function. The left ventricle has no regional wall motion abnormalities. The left ventricular internal cavity size was normal in size. There is no left ventricular hypertrophy. Left ventricular diastolic parameters are indeterminate.  Right Ventricle: The right ventricular size is normal. Right vetricular wall thickness was not assessed. Right ventricular systolic function is normal.  Left Atrium: Left atrial size was mildly dilated.  Right Atrium: Right atrial size was normal in size.  Pericardium: Trivial pericardial effusion is present.  Mitral Valve: There is mild thickening of the mitral valve leaflet(s). Mild mitral valve regurgitation.  Tricuspid Valve: The tricuspid valve is normal in structure. Tricuspid valve regurgitation is trivial.  Aortic Valve: The aortic valve is tricuspid. Aortic valve regurgitation is not visualized. Aortic valve sclerosis/calcification is present, without any evidence of aortic stenosis.  Pulmonic Valve: The pulmonic valve was normal in structure. Pulmonic valve regurgitation is mild.  Aorta: The aortic root and ascending aorta are structurally normal, with no evidence of dilitation.  Venous: The inferior vena cava is normal in size with greater than 50% respiratory variability, suggesting right atrial pressure of 3 mmHg.  IAS/Shunts: No atrial level shunt detected by color flow Doppler.   LEFT VENTRICLE PLAX 2D LVIDd:         4.70 cm   Diastology LVIDs:         3.10 cm   LV e' medial:    5.55 cm/s LV PW:         0.90 cm   LV E/e' medial:  21.1 LV IVS:        0.90 cm   LV e' lateral:   9.36 cm/s LVOT diam:     2.10 cm   LV E/e' lateral: 12.5 LV SV:         76 LV SV Index:   41 LVOT Area:     3.46 cm  3D Volume EF: 3D EF:        55 % LV EDV:       179 ml LV ESV:       81 ml LV SV:        98 ml  RIGHT VENTRICLE              IVC RV Basal diam:  3.60 cm     IVC diam: 1.80 cm RV Mid diam:    3.40 cm RV S prime:     11.60 cm/s TAPSE (M-mode): 1.9 cm  LEFT ATRIUM             Index  RIGHT ATRIUM           Index LA diam:        4.60 cm 2.49 cm/m   RA Area:     14.90 cm LA Vol (A2C):   66.9 ml 36.20 ml/m  RA Volume:   37.00 ml  20.02 ml/m LA Vol (A4C):   60.0 ml 32.46 ml/m LA Biplane Vol: 66.8 ml 36.14 ml/m AORTIC VALVE LVOT Vmax:   102.00 cm/s LVOT Vmean:  65.700 cm/s LVOT VTI:    0.219 m  AORTA Ao Root diam: 3.00 cm Ao Asc diam:  3.30 cm  MITRAL VALVE MV Area (PHT): 4.15 cm     SHUNTS MV Decel Time: 183 msec     Systemic VTI:  0.22 m MV E velocity: 117.00 cm/s  Systemic Diam: 2.10 cm MV A velocity: 100.00 cm/s MV E/A ratio:  1.17  Vina Gull MD Electronically signed by Vina Gull MD Signature Date/Time: 01/05/2024/4:45:40 PM    Final          ______________________________________________________________________________________________       Current Reported Medications:.    Current Meds  Medication Sig   acetaminophen  (TYLENOL ) 325 MG tablet Take 650 mg by mouth every 6 (six) hours as needed for moderate pain (pain score 4-6).   amLODipine  (NORVASC ) 5 MG tablet Take 1 tablet (5 mg total) by mouth daily.   aspirin  EC 81 MG tablet Take 1 tablet (81 mg total) by mouth daily. Swallow whole.   atorvastatin  (LIPITOR) 40 MG tablet Take 1 tablet (40 mg total) by mouth daily.   EPINEPHrine  0.3 mg/0.3 mL IJ SOAJ injection Inject 0.3 mg into the muscle as needed for anaphylaxis.   isosorbide  mononitrate (IMDUR ) 30 MG 24 hr tablet Take 1 tablet (30 mg total) by mouth daily.   isosorbide  mononitrate (IMDUR ) 60 MG 24 hr tablet Take 1 tablet (60 mg total) by mouth daily.   levothyroxine  (SYNTHROID ) 50 MCG tablet Take 1 tablet (50 mcg total) by mouth daily.   nitroGLYCERIN  (NITROSTAT ) 0.4 MG SL tablet Place 1 tablet (0.4 mg total) under the tongue every 5 (five) minutes as needed  for chest pain.   [DISCONTINUED] atorvastatin  (LIPITOR) 20 MG tablet Take 1 tablet (20 mg total) by mouth daily.   [DISCONTINUED] isosorbide  mononitrate (IMDUR ) 60 MG 24 hr tablet Take 1 tablet (60 mg total) by mouth daily. CAN TAKE AN ADDITIONAL 30MG  AS NEEDED FOR ANGINA    Physical Exam:    VS:  BP 124/80   Pulse 64   Ht 5' 3 (1.6 m)   Wt 183 lb (83 kg)   SpO2 98%   BMI 32.42 kg/m    Wt Readings from Last 3 Encounters:  03/11/24 183 lb (83 kg)  02/03/24 181 lb 3.2 oz (82.2 kg)  01/14/24 180 lb (81.6 kg)    GEN: Well nourished, well developed in no acute distress NECK: No JVD; No carotid bruits CARDIAC: RRR, no murmurs, rubs, gallops RESPIRATORY:  Clear to auscultation without rales, wheezing or rhonchi  ABDOMEN: Soft, non-tender, non-distended EXTREMITIES:  No edema; No acute deformity     Asessement and Plan:.    CAD: History of severe LCx stenosis with DES Cordis 2.5 x 18 mm stent in 2004.  LHC on 01/14/2024 revealed severe three-vessel disease with 100% occlusion in the circumflex artery and multiple 90% stenoses, no stenting targets.  Today patient reports that he has noted significant improvement with increased dose of Imdur .  He notes only 1 episode of  chest pressure, resolved with a single sublingual nitroglycerin , denied any recurrence.  Patient does report a right sided discomfort described as a knot in the right side of his chest.  He does not associate this with exertion and reports this quickly resolves without need for nitroglycerin .  Will increase patient's Imdur  to 90 mg daily.  Reviewed ED precautions. Check carotid dopplers. Continue aspirin  81 mg daily, atorvastatin , amlodipine  5 mg daily, Imdur .  Hypertension: Blood pressure today 124/80.  Continue amlodipine  5 mg daily, increase Imdur  to 90 mg daily.  Palpitations: Patient reports a fluttering sensation in his chest that lasts a few seconds, denies any associated symptoms.  Reports this is occurring a few times  a week.  Check 1 week non-live ZIO.  Patient not on beta-blocker given history of bradycardia.  Hyperlipidemia: Last lipid profile on 12/31/23 indicated total cholesterol 124, HDL 35, triglycerides 121 and LDL 65.  Given degree of CAD recommend LDL goal of less than 55. Increase atorvastatin  to 40 mg daily, check fasting lipid profile and LFTs in 6 to 8 weeks.  Mitral regurgitation: Noted on echocardiogram.  Patient remains asymptomatic.  Hypothyroidism: Monitored and managed per PCP.   Diplopia: Patient reports that in the last 3 weeks he has had persistent double vision, evaluated in the ED with reassuring workup, imaging without acute abnormality.  Check carotid Dopplers.  He is to see neurology outpatient in August, questions if related to history of Bell's palsy.    Cardiac Rehabilitation Eligibility Assessment  The patient is NOT ready to start cardiac rehabilitation due to: Other (Continues to have angina, adjusting medications, will re-evaluate on follow up in three weeks.)   Disposition: F/u with Clydene Burack, NP or Jackee Alberts, NP in three weeks.   Signed, Dylon Correa D Yaelis Scharfenberg, NP

## 2024-03-11 ENCOUNTER — Ambulatory Visit

## 2024-03-11 ENCOUNTER — Ambulatory Visit: Attending: Cardiology | Admitting: Cardiology

## 2024-03-11 ENCOUNTER — Encounter: Payer: Self-pay | Admitting: Cardiology

## 2024-03-11 VITALS — BP 124/80 | HR 64 | Ht 63.0 in | Wt 183.0 lb

## 2024-03-11 DIAGNOSIS — I25119 Atherosclerotic heart disease of native coronary artery with unspecified angina pectoris: Secondary | ICD-10-CM

## 2024-03-11 DIAGNOSIS — H532 Diplopia: Secondary | ICD-10-CM

## 2024-03-11 DIAGNOSIS — I340 Nonrheumatic mitral (valve) insufficiency: Secondary | ICD-10-CM

## 2024-03-11 DIAGNOSIS — R002 Palpitations: Secondary | ICD-10-CM

## 2024-03-11 DIAGNOSIS — I10 Essential (primary) hypertension: Secondary | ICD-10-CM

## 2024-03-11 DIAGNOSIS — E785 Hyperlipidemia, unspecified: Secondary | ICD-10-CM | POA: Diagnosis not present

## 2024-03-11 DIAGNOSIS — E039 Hypothyroidism, unspecified: Secondary | ICD-10-CM

## 2024-03-11 MED ORDER — ATORVASTATIN CALCIUM 40 MG PO TABS: 40.0000 mg | ORAL_TABLET | Freq: Every day | ORAL | 3 refills | Status: DC

## 2024-03-11 MED ORDER — ISOSORBIDE MONONITRATE ER 30 MG PO TB24: 30.0000 mg | ORAL_TABLET | Freq: Every day | ORAL | 3 refills | Status: AC

## 2024-03-11 MED ORDER — ISOSORBIDE MONONITRATE ER 60 MG PO TB24: 60.0000 mg | ORAL_TABLET | Freq: Every day | ORAL | 3 refills | Status: DC

## 2024-03-11 NOTE — Progress Notes (Unsigned)
 Enrolled patient for a 14 day Zio XT  monitor to be mailed to patients home

## 2024-03-11 NOTE — Patient Instructions (Signed)
 Medication Instructions:  Increase Atorvastatin  to 40 mg once a day  Increase Imdur  to 90 mg (60 mg and 30 mg tablet) once a day *If you need a refill on your cardiac medications before your next appointment, please call your pharmacy*  Lab Work: In 2 month we are going to need a fasting lipid panel and LFT's If you have labs (blood work) drawn today and your tests are completely normal, you will receive your results only by: MyChart Message (if you have MyChart) OR A paper copy in the mail If you have any lab test that is abnormal or we need to change your treatment, we will call you to review the results.  Testing/Procedures: Your physician has requested that you have a carotid duplex. This test is an ultrasound of the carotid arteries in your neck. It looks at blood flow through these arteries that supply the brain with blood. Allow one hour for this exam. There are no restrictions or special instructions.  Follow-Up: At [facility], you and your health needs are our priority.  As part of our continuing mission to provide you with exceptional heart care, our providers are all part of one team.  This team includes your primary Cardiologist (physician) and Advanced Practice Providers or APPs (Physician Assistants and Nurse Practitioners) who all work together to provide you with the care you need, when you need it.  Your next appointment:   3-4 week(s)  Provider:   Jackee Alberts, NP or Katlyn West, NP, Then, Alm Clay, MD will plan to see you again in 3 month(s).    We recommend signing up for the patient portal called MyChart.  Sign up information is provided on this After Visit Summary.  MyChart is used to connect with patients for Virtual Visits (Telemedicine).  Patients are able to view lab/test results, encounter notes, upcoming appointments, etc.  Non-urgent messages can be sent to your provider as well.   To learn more about what you can do with MyChart, go to  [email or web address].   Other Instructions ZIO AT Long term monitor-Live Telemetry  Your physician has requested you wear a ZIO patch monitor for 14 days.  This is a single patch monitor. [employer] supplies one patch monitor per enrollment. Additional  stickers are not available.  Please do not apply patch if you will be having a Nuclear Stress Test, Echocardiogram, Cardiac CT, MRI,  or Chest Xray during the period you would be wearing the monitor. The patch cannot be worn during  these tests. You cannot remove and re-apply the ZIO AT patch monitor.  Your ZIO patch monitor will be mailed 3 day USPS to your address on file. It may take 3-5 days to  receive your monitor after you have been enrolled.  Once you have received your monitor, please review the enclosed instructions. Your monitor has  already been registered assigning a specific monitor serial # to you.   Billing and Patient Assistance Program information  [place] has been supplied with any insurance information on record for billing. [employer] offers a sliding scale Patient Assistance Program for patients without insurance, or whose  insurance does not completely cover the cost of the ZIO patch monitor. You must apply for the  Patient Assistance Program to qualify for the discounted rate. To apply, call [employer] at [phone],  select option 4, select option 2 , ask to apply for the Patient Assistance Program, (you can request an  interpreter if needed). [employer] will ask your household income  and how many people are in your  household. [employer] will quote your out-of-pocket cost based on this information. They will also be able  to set up a 12 month interest free payment plan if needed.  Applying the monitor   Shave hair from upper left chest.  Hold the abrader disc by orange tab. Rub the abrader in 40 strokes over left upper chest as indicated in  your monitor instructions.  Clean area with 4 enclosed alcohol pads. Use all  pads to ensure the area is cleaned thoroughly. Let  dry.  Apply patch as indicated in monitor instructions. Patch will be placed under collarbone on left side of  chest with arrow pointing upward.  Rub patch adhesive wings for 2 minutes. Remove the white label marked 1. Remove the white label  marked 2. Rub patch adhesive wings for 2 additional minutes.  While looking in a mirror, press and release button in center of patch. A small green light will flash 3-4  times. This will be your only indicator that the monitor has been turned on.  Do not shower for the first 24 hours. You may shower after the first 24 hours.  Press the button if you feel a symptom. You will hear a small click. Record Date, Time and Symptom in  the Patient Log.   Starting the Gateway  In your kit there is a Audiological scientist box the size of a cellphone. This is Buyer, retail. It transmits all your  recorded data to [facility]. This box must always stay within 10 feet of you. Open the box and push the *  button. There will be a light that blinks orange and then green a few times. When the light stops  blinking, the Gateway is connected to the ZIO patch. Call [employer] at [phone] to confirm your monitor is transmitting.  Returning your monitor  Remove your patch and place it inside the Gateway. In the lower half of the Gateway there is a white  bag with prepaid postage on it. Place Gateway in bag and seal. Mail package back to [place] as soon as  possible. Your physician should have your final report approximately 7 days after you have mailed back  your monitor. Call [facility] Customer Care at [phone] if you have questions regarding your ZIO AT  patch monitor. Call them immediately if you see an orange light blinking on your monitor.  If your monitor falls off in less than 4 days, contact our Monitor department at [phone]. If your  monitor becomes loose or falls off after 4 days call [employer] at  [phone] for suggestions on  securing your monitor

## 2024-03-16 ENCOUNTER — Ambulatory Visit: Admitting: Cardiology

## 2024-03-17 ENCOUNTER — Ambulatory Visit (HOSPITAL_COMMUNITY)
Admission: RE | Admit: 2024-03-17 | Discharge: 2024-03-17 | Disposition: A | Discharge status: Home / Self Care | Source: Ambulatory Visit | Attending: Cardiology | Admitting: Cardiology

## 2024-03-17 DIAGNOSIS — H532 Diplopia: Secondary | ICD-10-CM | POA: Diagnosis not present

## 2024-03-21 ENCOUNTER — Ambulatory Visit: Payer: Self-pay | Admitting: Cardiology

## 2024-03-21 DIAGNOSIS — E785 Hyperlipidemia, unspecified: Secondary | ICD-10-CM

## 2024-03-24 ENCOUNTER — Other Ambulatory Visit: Payer: Self-pay

## 2024-03-24 DIAGNOSIS — H532 Diplopia: Secondary | ICD-10-CM

## 2024-03-24 NOTE — Telephone Encounter (Signed)
-----   Message from Katlyn D [place] sent at 03/21/2024  9:40 AM EDT ----- Resulted through [place], he will need repeat carotid dopplers in one year. Thanks!  ----- Message ----- From: Interface, Three One Seven Sent: 03/17/2024   2:54 PM EDT To: Katlyn D West, NP

## 2024-03-24 NOTE — Telephone Encounter (Signed)
Placed orders for patient

## 2024-03-25 NOTE — Progress Notes (Signed)
Order set

## 2024-03-31 NOTE — Progress Notes (Unsigned)
 Cardiology Office Note    Date:  04/01/2024  ID:  Hulet, Ehrmann 30-Jun-1956, MRN [id number] PCP:  Rilla Baller, MD  Cardiologist:  Alm Clay, MD  Electrophysiologist:  None   Chief Complaint: Follow up for CAD   History of Present Illness: .    Roger [name] is a 68 y.o. male with visit-pertinent history of CAD s/p PCI to the LCx in 2004, hyperlipidemia, hypertension, hypothyroidism.   Patient with history of CAD, he underwent catheter progressive angina in 2004, noted to have severe LCx stenosis with DES Cordis 2.5 x 18 mm stent was placed, noted to have additional 40 to 50% stenosis elsewhere.  Echo on 01/05/2024 indicated LVEF of 55 to 60%, no RWMA, diastolic parameters were indeterminate, RV systolic function and size was normal, LA was mildly dilated, mild mitral valve regurgitation, aortic valve sclerosis was present without any evidence of stenosis.  Patient was first seen by Dr. Clay on 01/12/2024 for cardiac clearance for a colonoscopy.  Patient reported experiencing intermittent chest tightness and shortness of breath, particular during physical activity such as walking.  Patient underwent cardiac catheterization with angiography on 01/14/2024, noted to have three-vessel obstructive CAD, diffuse segmental small vessel disease involving the 1st and 2nd diagonals, 2nd and 3rd OM's, PDA and PL branches.  Moderate proximal LAD stenosis with borderline RFR of 0.9.  Aggressive medical therapy was recommended, noted to have no good targets for PCI.  Noted that if he had refractory angina despite good medical therapy would need to consider CABG.  Patient was discharged on Lipitor and Imdur  30 mg daily.  Patient was seen in clinic on 02/03/2024 for post PCI follow-up.  Patient reported that he continued to experience chest discomfort described as a sharp pain under the left rib cage particularly when walking.  Patient's isosorbide  was increased to 60 mg daily.  On 03/05/2024  patient presented to the emergency room with multiple days of double vision.  CT head was negative, MRI brain, MRV showed no acute abnormality.  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 It was recommended that he follow-up with neurology outpatient.  Patient was seen in clinic on 03/11/2024 for follow-up.  He reported he was doing well, he reported that he been having significant improvement in how he was feeling with increased dose of Imdur .  He reported 1 episode of chest pressure in the last month, reported this resolved with a single sublingual nitroglycerin , denied any recurrence.  He did report an intermittent sensation of having a knot on the right side of his chest that was intermittent, not associate with exertion.  He reported he had not taken any sublingual nitroglycerin  for this as he reported it resolved extremely quickly.  He also endorsed some intermittent palpitations described as a fluttering sensation that lasted for a few seconds occurring a few times a week.  Patient's Imdur  was increased to 90 mg daily and carotid Dopplers were checked given diplopia.  Today he presents for follow-up.  He reports that he has been feeling overall better, notes an occasional fleeting sharp sensation in his chest lasting less than a minute or less, has only occurred twice since last office. Reports not associated with exertion. He has not required nitroglycerin  for this. He denies shortness of breath, lower extremity,  orthopnea or pnd. He denies any lightheadedness, presyncope or syncope. Notes he is not overall active and has been limited by his double vision. He reports ongoing balance issues, he is to follow-up with neurology on Monday. ROS: .   Today he denies shortness of breath, lower  extremity edema, fatigue, palpitations, melena, hematuria, hemoptysis, diaphoresis, weakness, presyncope, syncope, orthopnea, and PND.  All other systems are reviewed and otherwise negative. Studies Reviewed: SABRA   EKG:  EKG is not ordered today.  CV Studies: Cardiac studies reviewed are outlined and summarized above. Otherwise please see EMR for full report. Cardiac Studies & Procedures   ______________________________________________________________________________________________ CARDIAC CATHETERIZATION  CARDIAC CATHETERIZATION 01/14/2024  Conclusion   Prox RCA lesion is 40% stenosed.   RPDA-1 lesion is 90% stenosed.   RPDA-2 lesion is 90% stenosed.   1st RPL lesion is 90% stenosed.   Prox LAD lesion is 70% stenosed.   Mid LAD lesion is 50% stenosed.   1st Diag lesion is 90% stenosed.   2nd Diag lesion is 90% stenosed.   2nd Mrg lesion is 100% stenosed.   Mid Cx to Dist Cx lesion is 100% stenosed.   LV end diastolic pressure is mildly elevated.  3 vessel obstructive CAD. Diffuse segmental small vessel disease involving the first and second diagonals, second and third OMs, PDA and PL branches. Moderate proximal LAD stenosis with borderline RFR 0.9 Mildly elevated LVEDP  Plan: recommend aggressive medical therapy. No good targets for PCI. If he has refractory angina despite good medical therapy would need to consider CABG  Findings Coronary Findings Diagnostic  Dominance: Right  Left Main Vessel was injected. Vessel is normal in caliber. There is mild focal disease in the vessel.  Left Anterior Descending Prox LAD lesion is 70% stenosed. Pressure gradient was performed on the lesion. RFR: 0.9. Mid LAD lesion is 50% stenosed.  First Diagonal Branch 1st Diag lesion is 90% stenosed.  Second Diagonal Branch 2nd Diag lesion is 90% stenosed.  Left Circumflex Mid Cx to Dist Cx lesion is 100% stenosed. The lesion was previously treated .  Second Obtuse Marginal Branch 2nd Mrg  lesion is 100% stenosed.  Right Coronary Artery Prox RCA lesion is 40% stenosed.  Right Posterior Descending Artery RPDA-1 lesion is 90% stenosed. RPDA-2 lesion is 90% stenosed.  First Right Posterolateral Branch 1st RPL lesion is 90% stenosed.  Intervention  No interventions have been documented.     ECHOCARDIOGRAM  ECHOCARDIOGRAM COMPLETE 01/05/2024  Narrative ECHOCARDIOGRAM REPORT    Patient Name:   FLOY RIEGLER Date of Exam: 01/05/2024 Medical Rec #:  [id number]      Height:       63.3 in Accession #:    [id number]     Weight:       178.5 lb Date of Birth:  03-27-1956     BSA:          1.848 m Patient Age:    67 years       BP:           152/66 mmHg Patient Gender: M              HR:           60 bpm. Exam Location:  [address]  Procedure: 2D Echo, Color Doppler, Cardiac Doppler and 3D Echo (Both Spectral and Color Flow Doppler were utilized during procedure).  Indications:    Murmur R01.1  History:        Patient has no prior history  of Echocardiogram examinations. CAD, Coronary Stent intervention; Risk Factors:Dyslipidemia.  Sonographer:    Augustin Seals RDCS Referring Phys: [id number] JAVIER GUTIERREZ  IMPRESSIONS   1. Left ventricular ejection fraction, by estimation, is 55 to 60%. The left ventricle has normal function. The left ventricle has no regional wall motion abnormalities. Left ventricular diastolic parameters are indeterminate. 2. Right ventricular systolic function is normal. The right ventricular size is normal. 3. Left atrial size was mildly dilated. 4. Mild mitral valve regurgitation. 5. The aortic valve is tricuspid. Aortic valve regurgitation is not visualized. Aortic valve sclerosis/calcification is present, without any evidence of aortic stenosis. 6. The inferior vena cava is normal in size with greater than 50% respiratory variability, suggesting right atrial pressure of 3 mmHg.  FINDINGS Left Ventricle: Left ventricular ejection  fraction, by estimation, is 55 to 60%. The left ventricle has normal function. The left ventricle has no regional wall motion abnormalities. The left ventricular internal cavity size was normal in size. There is no left ventricular hypertrophy. Left ventricular diastolic parameters are indeterminate.  Right Ventricle: The right ventricular size is normal. Right vetricular wall thickness was not assessed. Right ventricular systolic function is normal.  Left Atrium: Left atrial size was mildly dilated.  Right Atrium: Right atrial size was normal in size.  Pericardium: Trivial pericardial effusion is present.  Mitral Valve: There is mild thickening of the mitral valve leaflet(s). Mild mitral valve regurgitation.  Tricuspid Valve: The tricuspid valve is normal in structure. Tricuspid valve regurgitation is trivial.  Aortic Valve: The aortic valve is tricuspid. Aortic valve regurgitation is not visualized. Aortic valve sclerosis/calcification is present, without any evidence of aortic stenosis.  Pulmonic Valve: The pulmonic valve was normal in structure. Pulmonic valve regurgitation is mild.  Aorta: The aortic root and ascending aorta are structurally normal, with no evidence of dilitation.  Venous: The inferior vena cava is normal in size with greater than 50% respiratory variability, suggesting right atrial pressure of 3 mmHg.  IAS/Shunts: No atrial level shunt detected by color flow Doppler.   LEFT VENTRICLE PLAX 2D LVIDd:         4.70 cm   Diastology LVIDs:         3.10 cm   LV e' medial:    5.55 cm/s LV PW:         0.90 cm   LV E/e' medial:  21.1 LV IVS:        0.90 cm   LV e' lateral:   9.36 cm/s LVOT diam:     2.10 cm   LV E/e' lateral: 12.5 LV SV:         76 LV SV Index:   41 LVOT Area:     3.46 cm  3D Volume EF: 3D EF:        55 % LV EDV:       179 ml LV ESV:       81 ml LV SV:        98 ml  RIGHT VENTRICLE             IVC RV Basal diam:  3.60 cm     IVC diam: 1.80  cm RV Mid diam:    3.40 cm RV S prime:     11.60 cm/s TAPSE (M-mode): 1.9 cm  LEFT ATRIUM             Index        RIGHT ATRIUM  Index LA diam:        4.60 cm 2.49 cm/m   RA Area:     14.90 cm LA Vol (A2C):   66.9 ml 36.20 ml/m  RA Volume:   37.00 ml  20.02 ml/m LA Vol (A4C):   60.0 ml 32.46 ml/m LA Biplane Vol: 66.8 ml 36.14 ml/m AORTIC VALVE LVOT Vmax:   102.00 cm/s LVOT Vmean:  65.700 cm/s LVOT VTI:    0.219 m  AORTA Ao Root diam: 3.00 cm Ao Asc diam:  3.30 cm  MITRAL VALVE MV Area (PHT): 4.15 cm     SHUNTS MV Decel Time: 183 msec     Systemic VTI:  0.22 m MV E velocity: 117.00 cm/s  Systemic Diam: 2.10 cm MV A velocity: 100.00 cm/s MV E/A ratio:  1.17  Vina Gull MD Electronically signed by Vina Gull MD Signature Date/Time: 01/05/2024/4:45:40 PM    Final          ______________________________________________________________________________________________       Current Reported Medications:.    Current Meds  Medication Sig   acetaminophen  (TYLENOL ) 325 MG tablet Take 650 mg by mouth every 6 (six) hours as needed for moderate pain (pain score 4-6).   amLODipine  (NORVASC ) 10 MG tablet Take 1 tablet (10 mg total) by mouth daily.   aspirin  EC 81 MG tablet Take 1 tablet (81 mg total) by mouth daily. Swallow whole.   atorvastatin  (LIPITOR) 40 MG tablet Take 1 tablet (40 mg total) by mouth daily.   EPINEPHrine  0.3 mg/0.3 mL IJ SOAJ injection Inject 0.3 mg into the muscle as needed for anaphylaxis.   isosorbide  mononitrate (IMDUR ) 30 MG 24 hr tablet Take 1 tablet (30 mg total) by mouth daily.   isosorbide  mononitrate (IMDUR ) 60 MG 24 hr tablet Take 1 tablet (60 mg total) by mouth daily.   levothyroxine  (SYNTHROID ) 50 MCG tablet Take 1 tablet (50 mcg total) by mouth daily.   nitroGLYCERIN  (NITROSTAT ) 0.4 MG SL tablet Place 1 tablet (0.4 mg total) under the tongue every 5 (five) minutes as needed for chest pain.   [DISCONTINUED] amLODipine  (NORVASC ) 5  MG tablet Take 1 tablet (5 mg total) by mouth daily.    Physical Exam:    VS:  BP 124/80   Pulse 67   Ht 5' 3 (1.6 m)   Wt 183 lb (83 kg)   SpO2 95%   BMI 32.42 kg/m    Wt Readings from Last 3 Encounters:  04/01/24 183 lb (83 kg)  03/11/24 183 lb (83 kg)  02/03/24 181 lb 3.2 oz (82.2 kg)    GEN: Well nourished, well developed in no acute distress NECK: No JVD; No carotid bruits CARDIAC: RRR, no murmurs, rubs, gallops RESPIRATORY:  Clear to auscultation without rales, wheezing or rhonchi  ABDOMEN: Soft, non-tender, non-distended EXTREMITIES:  No edema; No acute deformity     Asessement and Plan:.    CAD: History of severe LCx stenosis with DES Cordis 2.5 x 18 mm stent in 2004. LHC on 01/14/2024 revealed severe three-vessel disease with 100% occlusion in the circumflex artery and multiple 90% stenoses, no stenting targets.  At last office visit his Imdur  was increased to 90 mg daily. Today he reports that he continues to feel significantly improved, reports a sharp sensation in his chest lasting less than a minute or less, reports this is only occurred twice since his last office visit.  He denies any chest tightness or pressure.  He denies requiring any sublingual nitroglycerin .  He denies any chest pain on exertion however notes that he is not currently overly exerting himself given his ongoing double vision resulting in difficulties with balance.  Discussed having patient start cardiac rehab however through shared decision making elected to defer for now given ongoing double vision and difficulties with balance he does not feel that he would benefit from cardiac rehab at this time.  Will reconsider on follow-up.  Will increase his amlodipine  to 10 mg daily.  Reviewed ED precautions.Continue aspirin  81 mg daily, atorvastatin , amlodipine  10 mg daily and Imdur  90 mg daily.  Hypertension: Blood pressure today 124/80.  Continue amlodipine  10 mg daily, Imdur  90 mg daily.  Palpitations: At  last office visit patient reported a fluttering sensation in his chest that lasted a few seconds, denied any associated symptoms.  ZIO monitor is currently pending.  He is not on beta-blocker given history of bradycardia. He reports improvement in fluttering sensation, notes he has not had in a while.  Patient is not on beta-blocker given history of bradycardia.  Hyperlipidemia: Last lipid profile on 12/31/2023 indicated total cholesterol 124, HDL 35, triglycerides 121 and LDL 65.  Given degree of CAD recommended LDL goal is less than 55.  At last visit patient's atorvastatin  was increased to 40 mg daily.  To have repeat fasting lipid profile and LFTs in 3 to 4 weeks.  Mitral regurgitation: Noted to be mild on echocardiogram on 01/05/24.  Patient mains asymptomatic.  Hypothyroidism: Monitored and managed per PCP.  Carotid artery disease: Carotid duplex on 03/17/2024 indicated velocities in the right ICA consistent with 1 to 39% stenosis, velocities in the left ICA consistent with 40 to 59% stenosis.  To have repeat carotid duplex in 1 year for ongoing monitoring.  Continue statin and aspirin .  Diplopia: Patient in 02/2024 noted persistent double vision, was evaluated in the ED with reassuring workup, imaging without acute abnormality.  Carotid Dopplers reassuring as noted above.  Patient reports that he is to follow-up with neurology on Monday.    Cardiac Rehabilitation Eligibility Assessment  The patient has declined or is not appropriate for cardiac rehabilitation. (Patient continues to have double vision, does not feel that he is well-balanced enough to start cardiac rehab.  Will reconsider on follow-up.) Other (Continues to have angina, adjusting medications, will re-evaluate on follow up in three weeks.)   Disposition: F/u with Shar Paez, NP in six weeks.   Signed, Anyeli Hockenbury D Linkoln Alkire, NP

## 2024-04-01 ENCOUNTER — Encounter: Payer: Self-pay | Admitting: Cardiology

## 2024-04-01 ENCOUNTER — Ambulatory Visit: Attending: Cardiology | Admitting: Cardiology

## 2024-04-01 VITALS — BP 124/80 | HR 67 | Ht 63.0 in | Wt 183.0 lb

## 2024-04-01 DIAGNOSIS — H532 Diplopia: Secondary | ICD-10-CM | POA: Diagnosis not present

## 2024-04-01 DIAGNOSIS — E039 Hypothyroidism, unspecified: Secondary | ICD-10-CM

## 2024-04-01 DIAGNOSIS — I25119 Atherosclerotic heart disease of native coronary artery with unspecified angina pectoris: Secondary | ICD-10-CM | POA: Diagnosis not present

## 2024-04-01 DIAGNOSIS — E785 Hyperlipidemia, unspecified: Secondary | ICD-10-CM

## 2024-04-01 DIAGNOSIS — R002 Palpitations: Secondary | ICD-10-CM | POA: Diagnosis not present

## 2024-04-01 DIAGNOSIS — I340 Nonrheumatic mitral (valve) insufficiency: Secondary | ICD-10-CM

## 2024-04-01 DIAGNOSIS — I10 Essential (primary) hypertension: Secondary | ICD-10-CM | POA: Diagnosis not present

## 2024-04-01 MED ORDER — AMLODIPINE BESYLATE 10 MG PO TABS: 10.0000 mg | ORAL_TABLET | Freq: Every day | ORAL | 3 refills | Status: DC

## 2024-04-01 NOTE — Patient Instructions (Addendum)
 Medication Instructions:  Increase amlodipine  10 mg once a day  *If you need a refill on your cardiac medications before your next appointment, please call your pharmacy*  Lab Work: In 3-4 weeks we are going to need fasting labs If you have labs (blood work) drawn today and your tests are completely normal, you will receive your results only by: MyChart Message (if you have MyChart) OR A paper copy in the mail If you have any lab test that is abnormal or we need to change your treatment, we will call you to review the results.  Testing/Procedures: No testing  Follow-Up: At [facility], you and your health needs are our priority.  As part of our continuing mission to provide you with exceptional heart care, our providers are all part of one team.  This team includes your primary Cardiologist (physician) and Advanced Practice Providers or APPs (Physician Assistants and Nurse Practitioners) who all work together to provide you with the care you need, when you need it.  Your next appointment:   6 week(s)  Provider:   Katlyn West, NP  We recommend signing up for the patient portal called MyChart.  Sign up information is provided on this After Visit Summary.  MyChart is used to connect with patients for Virtual Visits (Telemedicine).  Patients are able to view lab/test results, encounter notes, upcoming appointments, etc.  Non-urgent messages can be sent to your provider as well.   To learn more about what you can do with MyChart, go to [email or web address].

## 2024-04-05 DIAGNOSIS — Z1331 Encounter for screening for depression: Secondary | ICD-10-CM | POA: Diagnosis not present

## 2024-04-05 DIAGNOSIS — H532 Diplopia: Secondary | ICD-10-CM | POA: Diagnosis not present

## 2024-04-05 DIAGNOSIS — H4920 Sixth [abducent] nerve palsy, unspecified eye: Secondary | ICD-10-CM | POA: Diagnosis not present

## 2024-04-05 DIAGNOSIS — H5000 Unspecified esotropia: Secondary | ICD-10-CM | POA: Diagnosis not present

## 2024-04-07 DIAGNOSIS — R002 Palpitations: Secondary | ICD-10-CM | POA: Diagnosis not present

## 2024-04-12 DIAGNOSIS — E785 Hyperlipidemia, unspecified: Secondary | ICD-10-CM | POA: Diagnosis not present

## 2024-04-12 DIAGNOSIS — I10 Essential (primary) hypertension: Secondary | ICD-10-CM | POA: Diagnosis not present

## 2024-04-12 DIAGNOSIS — E039 Hypothyroidism, unspecified: Secondary | ICD-10-CM | POA: Diagnosis not present

## 2024-04-12 DIAGNOSIS — I340 Nonrheumatic mitral (valve) insufficiency: Secondary | ICD-10-CM | POA: Diagnosis not present

## 2024-04-12 DIAGNOSIS — I25119 Atherosclerotic heart disease of native coronary artery with unspecified angina pectoris: Secondary | ICD-10-CM | POA: Diagnosis not present

## 2024-04-12 LAB — LIPID PANEL

## 2024-04-13 LAB — LIPID PANEL
Cholesterol, Total: 135 mg/dL (ref 100–199)
HDL: 34 mg/dL — AB (ref >39)
LDL CALC COMMENT:: 4 ratio (ref 0.0–5.0)
LDL Chol Calc (NIH): 65 mg/dL (ref 0–99)
Triglycerides: 218 mg/dL — ABNORMAL HIGH (ref 0–149)
VLDL Cholesterol Cal: 36 mg/dL (ref 5–40)

## 2024-04-13 LAB — HEPATIC FUNCTION PANEL
ALT: 29 IU/L (ref 0–44)
AST: 18 IU/L (ref 0–40)
Albumin: 4.1 g/dL (ref 3.9–4.9)
Alkaline Phosphatase: 116 IU/L (ref 44–121)
Bilirubin Total: 0.6 mg/dL (ref 0.0–1.2)
Bilirubin, Direct: 0.18 mg/dL (ref 0.00–0.40)
Total Protein: 6.8 g/dL (ref 6.0–8.5)

## 2024-04-21 NOTE — Telephone Encounter (Signed)
 Left message to call back.

## 2024-04-21 NOTE — Telephone Encounter (Signed)
-----   Message from Rosabel BIRCH [place] sent at 04/13/2024  7:33 PM EDT ----- Please let Mr. Dilks know that his lipid panel indicates that his triglycerides are elevated and his LDL remains above goal.  His LFTs were normal. Please confirm that his lab work completed was  fasting.  If his lab work was fasting recommend increasing his atorvastatin  to 80 mg daily.  He will need a fasting lipid profile and LFTs repeated in 8 weeks. ----- Message ----- From: Rebecka Memos Lab Results In Sent: 04/12/2024  11:36 PM EDT To: Katlyn D West, NP

## 2024-04-30 MED ORDER — ATORVASTATIN CALCIUM 80 MG PO TABS: 80.0000 mg | ORAL_TABLET | Freq: Every day | ORAL | 3 refills | Status: AC

## 2024-04-30 NOTE — Addendum Note (Signed)
 Addended by: Miel Wisener W on: 04/30/2024 12:55 PM   Modules accepted: Orders

## 2024-05-16 NOTE — Progress Notes (Unsigned)
 Cardiology Office Note    Date:  05/17/2024  ID:  Roger [name] 1956/05/17, MRN [id number] PCP:  Roger Baller, MD  Cardiologist:  Roger Clay, MD  Electrophysiologist:  None   Chief Complaint: Follow up for CAD   History of Present Illness: .    Roger [name] is a 68 y.o. male with visit-pertinent history of CAD s/p PCI to the LCx in 2004, hyperlipidemia, hypertension, hypothyroidism.   Patient with history of CAD, he underwent catheter progressive angina in 2004, noted to have severe LCx stenosis with DES Cordis 2.5 x 18 mm stent was placed, noted to have additional 40 to 50% stenosis elsewhere.  Echo on 01/05/2024 indicated LVEF of 55 to 60%, no RWMA, diastolic parameters were indeterminate, RV systolic function and size was normal, LA was mildly dilated, mild mitral valve regurgitation, aortic valve sclerosis was present without any evidence of stenosis.  Patient was first seen by Dr. Clay on 01/12/2024 for cardiac clearance for a colonoscopy.  Patient reported experiencing intermittent chest tightness and shortness of breath, particular during physical activity such as walking.  Patient underwent cardiac catheterization with angiography on 01/14/2024, noted to have three-vessel obstructive CAD, diffuse segmental small vessel disease involving the 1st and 2nd diagonals, 2nd and 3rd OM's, PDA and PL branches.  Moderate proximal LAD stenosis with borderline RFR of 0.9.  Aggressive medical therapy was recommended, noted to have no good targets for PCI.  Noted that if he had refractory angina despite good medical therapy would need to consider CABG.  Patient was discharged on Lipitor and Imdur  30 mg daily.  Patient was seen in clinic on 02/03/2024 for post PCI follow-up.  Patient reported that he continued to experience chest discomfort described as a sharp pain under the left rib cage particularly when walking.  Patient's isosorbide  was increased to 60 mg daily.  On 03/05/2024  patient presented to the emergency room with multiple days of double vision.  CT head was negative, MRI brain, MRV showed no acute abnormality.  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 It was recommended that he follow-up with neurology outpatient.  Patient was seen in clinic on 03/11/2024 for follow-up.  He reported he was doing well, he reported that he been having significant improvement in how he was feeling with increased dose of Imdur .  He reported 1 episode of chest pressure in the last month, reported this resolved with a single sublingual nitroglycerin , denied any recurrence.  He did report an intermittent sensation of having a knot on the right side of his chest that was intermittent, not associate with exertion.  He reported he had not taken any sublingual nitroglycerin  for this as he reported it resolved extremely quickly.  He also endorsed some intermittent palpitations described as a fluttering sensation that lasted for a few seconds occurring a few times a week.  Patient's Imdur  was increased to 90 mg daily and carotid Dopplers were checked given diplopia.  Carotid Dopplers on 03/17/2024 indicated 1 to 39% right ICA stenosis, 40 to 59% left ICA stenosis.  Patient was last in the clinic on 04/01/2024 for follow-up, he reported that he was feeling better overall, noted some occasional fleeting sharp sensation in his chest lasting less than a minute and had only occurred twice since last  office visit.  Denied any symptoms with exertion and had not required any further nitroglycerin .  Patient's amlodipine  was increased to 10 mg daily.  Preliminary results of cardiac monitor worn for 13 days and 10 hours indicate an average [name] rate of 72 bpm ranging from 28 to 182 bpm.  Predominant  underlying rhythm was sinus rhythm, he had 4 episodes of supraventricular tachycardia, run with the fastest interval lasting 8 beats with a max rate of 182 bpm, longest lasting 16 beats with an average rate of 147 bpm.  Second-degree AV block-Mobitz 1 was present.  Today he presents for follow-up.  He reports that he is doing well. Recently returned from his cruise, notes he tolerated very well. He has not required sublingual nitroglycerin , he continues to note occasional sharp right sided pain that resolves in a few seconds, denies any associated symptoms.  He otherwise denies any chest pain, pressure or tightness.  Notes that he has not been very active.  He was evaluated by neurology, it is questioned if he had some microvascular ischemia resulting in his double vision, is currently undergoing workup for myasthenia gravis.  Patient reports that he is to follow-up with neurology later this week.  He denies any shortness of breath, lower extremity edema, orthopnea or PND.  He denies any palpitations, presyncope or syncope. ROS: .   Today he denies shortness of breath, lower extremity edema, fatigue, melena, hematuria, hemoptysis, diaphoresis, weakness, presyncope, syncope, orthopnea, and PND.  All other systems are reviewed and otherwise negative. Studies Reviewed: SABRA   EKG:  EKG is not ordered today.  CV Studies: Cardiac studies reviewed are outlined and summarized above. Otherwise please see EMR for full report. Cardiac Studies & Procedures   ______________________________________________________________________________________________ CARDIAC CATHETERIZATION  CARDIAC CATHETERIZATION 01/14/2024  Conclusion   Prox RCA lesion is 40% stenosed.   RPDA-1 lesion is 90% stenosed.   RPDA-2 lesion is 90% stenosed.   1st RPL lesion is 90% stenosed.   Prox LAD lesion is 70% stenosed.   Mid LAD lesion is 50% stenosed.   1st Diag lesion is 90% stenosed.   2nd Diag lesion is 90% stenosed.   2nd Mrg lesion  is 100% stenosed.   Mid Cx to Dist Cx lesion is 100% stenosed.   LV end diastolic pressure is mildly elevated.  3 vessel obstructive CAD. Diffuse segmental small vessel disease involving the first and second diagonals, second and third OMs, PDA and PL branches. Moderate proximal LAD stenosis with borderline RFR 0.9 Mildly elevated LVEDP  Plan: recommend aggressive medical therapy. No good targets for PCI. If he has refractory angina despite good medical therapy would need to consider CABG  Findings Coronary Findings Diagnostic  Dominance: Right  Left Main Vessel was injected. Vessel is normal in caliber. There is mild focal disease in the vessel.  Left Anterior Descending Prox LAD lesion is 70% stenosed. Pressure gradient was performed on the lesion. RFR: 0.9. Mid LAD lesion is 50% stenosed.  First Diagonal Branch 1st Diag lesion is 90% stenosed.  Second Diagonal Branch 2nd Diag lesion is 90% stenosed.  Left Circumflex Mid Cx to Dist Cx lesion is 100% stenosed. The lesion was previously treated .  Second Obtuse Marginal Branch 2nd Mrg lesion is 100% stenosed.  Right Coronary Artery Prox RCA lesion is 40% stenosed.  Right Posterior Descending Artery RPDA-1 lesion is 90% stenosed. RPDA-2 lesion is 90% stenosed.  First Right Posterolateral Branch 1st RPL lesion is 90% stenosed.  Intervention  No interventions have  been documented.     ECHOCARDIOGRAM  ECHOCARDIOGRAM COMPLETE 01/05/2024  Narrative ECHOCARDIOGRAM REPORT    Patient Name:   Roger [name] Date of Exam: 01/05/2024 Medical Rec #:  [id number]      Height:       63.3 in Accession #:    [id number]     Weight:       178.5 lb Date of Birth:  [birth year]     BSA:          1.848 m Patient Age:    67 years       BP:           152/66 mmHg Patient Gender: M              HR:           60 bpm. Exam Location:  [address]  Procedure: 2D Echo, Color Doppler, Cardiac Doppler and 3D Echo (Both Spectral and  Color Flow Doppler were utilized during procedure).  Indications:    Murmur R01.1  History:        Patient has no prior history of Echocardiogram examinations. CAD, Coronary Stent intervention; Risk Factors:Dyslipidemia.  Sonographer:    Augustin Seals RDCS Referring Phys: [id number] JAVIER GUTIERREZ  IMPRESSIONS   1. Left ventricular ejection fraction, by estimation, is 55 to 60%. The left ventricle has normal function. The left ventricle has no regional wall motion abnormalities. Left ventricular diastolic parameters are indeterminate. 2. Right ventricular systolic function is normal. The right ventricular size is normal. 3. Left atrial size was mildly dilated. 4. Mild mitral valve regurgitation. 5. The aortic valve is tricuspid. Aortic valve regurgitation is not visualized. Aortic valve sclerosis/calcification is present, without any evidence of aortic stenosis. 6. The inferior vena cava is normal in size with greater than 50% respiratory variability, suggesting right atrial pressure of 3 mmHg.  FINDINGS Left Ventricle: Left ventricular ejection fraction, by estimation, is 55 to 60%. The left ventricle has normal function. The left ventricle has no regional wall motion abnormalities. The left ventricular internal cavity size was normal in size. There is no left ventricular hypertrophy. Left ventricular diastolic parameters are indeterminate.  Right Ventricle: The right ventricular size is normal. Right vetricular wall thickness was not assessed. Right ventricular systolic function is normal.  Left Atrium: Left atrial size was mildly dilated.  Right Atrium: Right atrial size was normal in size.  Pericardium: Trivial pericardial effusion is present.  Mitral Valve: There is mild thickening of the mitral valve leaflet(s). Mild mitral valve regurgitation.  Tricuspid Valve: The tricuspid valve is normal in structure. Tricuspid valve regurgitation is trivial.  Aortic Valve: The aortic  valve is tricuspid. Aortic valve regurgitation is not visualized. Aortic valve sclerosis/calcification is present, without any evidence of aortic stenosis.  Pulmonic Valve: The pulmonic valve was normal in structure. Pulmonic valve regurgitation is mild.  Aorta: The aortic root and ascending aorta are structurally normal, with no evidence of dilitation.  Venous: The inferior vena cava is normal in size with greater than 50% respiratory variability, suggesting right atrial pressure of 3 mmHg.  IAS/Shunts: No atrial level shunt detected by color flow Doppler.   LEFT VENTRICLE PLAX 2D LVIDd:         4.70 cm   Diastology LVIDs:         3.10 cm   LV e' medial:    5.55 cm/s LV PW:         0.90 cm   LV E/e' medial:  21.1 LV  IVS:        0.90 cm   LV e' lateral:   9.36 cm/s LVOT diam:     2.10 cm   LV E/e' lateral: 12.5 LV SV:         76 LV SV Index:   41 LVOT Area:     3.46 cm  3D Volume EF: 3D EF:        55 % LV EDV:       179 ml LV ESV:       81 ml LV SV:        98 ml  RIGHT VENTRICLE             IVC RV Basal diam:  3.60 cm     IVC diam: 1.80 cm RV Mid diam:    3.40 cm RV S prime:     11.60 cm/s TAPSE (M-mode): 1.9 cm  LEFT ATRIUM             Index        RIGHT ATRIUM           Index LA diam:        4.60 cm 2.49 cm/m   RA Area:     14.90 cm LA Vol (A2C):   66.9 ml 36.20 ml/m  RA Volume:   37.00 ml  20.02 ml/m LA Vol (A4C):   60.0 ml 32.46 ml/m LA Biplane Vol: 66.8 ml 36.14 ml/m AORTIC VALVE LVOT Vmax:   102.00 cm/s LVOT Vmean:  65.700 cm/s LVOT VTI:    0.219 m  AORTA Ao Root diam: 3.00 cm Ao Asc diam:  3.30 cm  MITRAL VALVE MV Area (PHT): 4.15 cm     SHUNTS MV Decel Time: 183 msec     Systemic VTI:  0.22 m MV E velocity: 117.00 cm/s  Systemic Diam: 2.10 cm MV A velocity: 100.00 cm/s MV E/A ratio:  1.17  Vina Gull MD Electronically signed by Vina Gull MD Signature Date/Time: 01/05/2024/4:45:40 PM    Final           ______________________________________________________________________________________________       Current Reported Medications:.    Current Meds  Medication Sig   acetaminophen  (TYLENOL ) 325 MG tablet Take 650 mg by mouth every 6 (six) hours as needed for moderate pain (pain score 4-6).   amLODipine  (NORVASC ) 10 MG tablet Take 1 tablet (10 mg total) by mouth daily.   aspirin  EC 81 MG tablet Take 1 tablet (81 mg total) by mouth daily. Swallow whole.   atorvastatin  (LIPITOR) 80 MG tablet Take 1 tablet (80 mg total) by mouth daily.   EPINEPHrine  0.3 mg/0.3 mL IJ SOAJ injection Inject 0.3 mg into the muscle as needed for anaphylaxis.   isosorbide  mononitrate (IMDUR ) 30 MG 24 hr tablet Take 1 tablet (30 mg total) by mouth daily.   isosorbide  mononitrate (IMDUR ) 60 MG 24 hr tablet Take 1 tablet (60 mg total) by mouth daily.   levothyroxine  (SYNTHROID ) 50 MCG tablet Take 1 tablet (50 mcg total) by mouth daily.   nitroGLYCERIN  (NITROSTAT ) 0.4 MG SL tablet Place 1 tablet (0.4 mg total) under the tongue every 5 (five) minutes as needed for chest pain.   Physical Exam:    VS:  BP 120/80   Pulse 80   Ht 5' 3 (1.6 m)   Wt 185 lb (83.9 kg)   SpO2 96%   BMI 32.77 kg/m    Wt Readings from Last 3 Encounters:  05/17/24 185 lb (83.9  kg)  04/01/24 183 lb (83 kg)  03/11/24 183 lb (83 kg)    GEN: Well nourished, well developed in no acute distress NECK: No JVD; No carotid bruits CARDIAC: RRR, no murmurs, rubs, gallops RESPIRATORY:  Clear to auscultation without rales, wheezing or rhonchi  ABDOMEN: Soft, non-tender, non-distended EXTREMITIES:  No edema; No acute deformity     Asessement and Plan:.    CAD:  History of severe LCx stenosis with DES Cordis 2.5 x 18 mm stent in 2004. LHC on 01/14/2024 revealed severe three-vessel disease with 100% occlusion in the circumflex artery and multiple 90% stenoses, no stenting targets. Today he reports that he is doing well, notes an occasional  fleeting sharp right sided discomfort that resolves in seconds.  He denies any significant shortness of breath.  Patient does however note that he has not been regularly exerting himself, he feels that he is optimized to start cardiac rehab, would like to have close evaluation by Dr. Anner with increased exertion as already planned.  Patient can start cardiac rehab. Reviewed ED precautions. Continue aspirin  81 mg daily, atorvastatin , amlodipine  10 mg daily and Imdur  90 mg daily.  Hypertension: Blood pressure today 120/80. Continue current antihypertensive.   Palpitations:Preliminary results of cardiac monitor worn for 13 days and 10 hours indicate an average [name] rate of 72 bpm ranging from 28 to 182 bpm.  Predominant underlying rhythm was sinus rhythm, he had 4 episodes of supraventricular tachycardia, run with the fastest interval lasting 8 beats with a max rate of 182 bpm, longest lasting 16 beats with an average rate of 147 bpm.  Second-degree AV block-Mobitz 1 was present. Triggered events were associated with sinus rhythm. Today he reports that he is doing well, notes an occasional fluttering sensation that is rare and short in duration.  He denies any presyncope or syncope. Avoid AV nodal blocking agents given hx of bradycardia.  Patient agreeable to sleep study as noted below.   Hyperlipidemia: Last lipid profile in 04/12/2024 indicated total cholesterol 135, HDL 34, triglycerides 218 and LDL 65.  Patient's atorvastatin  increased to 80 mg daily.  Patient due for fasting lipid profile and LFTs in 2 weeks, patient to have his annual physical and fasting labs completed at that time.  Mitral regurgitation: Noted be mild on echo in 12/2023.  Patient remains asymptomatic.  Hypothyroidism: Monitored and managed per PCP.  Carotid artery disease: Carotid duplex on 03/17/2024 indicated velocities in right ICA consistent with 1 to 39% stenosis, velocities in the left ICA consistent with 40 to 59% stenosis.   Patient to have repeat carotid duplex in 1 year for ongoing monitoring.  Continue aspirin  and statin.  Diplopia: In 02/2024 patient noted persistent double vision, was evaluated in the ED with reassuring workup, imaging without acute abnormalities.  Carotid Dopplers as noted above.  Patient is now being followed by neurology at [facility], to have follow up again on Wednesday. On review of notes there is high suspicion for microvascular ischemia.   Sleep disordered breathing: Patient and wife endorse increased snoring at night.  He is agreeable to at home sleep study.  STOP-BANG score of 7.   Cardiac Rehabilitation Eligibility Assessment  The patient is ready to start cardiac rehabilitation from a cardiac standpoint.   Disposition: F/u with Dr. Anner in 102/2025 as scheduled per patient request.   Signed, Roniqua Kintz D Camary Sosa, NP

## 2024-05-17 ENCOUNTER — Encounter: Payer: Self-pay | Admitting: Cardiology

## 2024-05-17 ENCOUNTER — Ambulatory Visit: Attending: Cardiology | Admitting: Cardiology

## 2024-05-17 VITALS — BP 120/80 | HR 80 | Ht 63.0 in | Wt 185.0 lb

## 2024-05-17 DIAGNOSIS — I10 Essential (primary) hypertension: Secondary | ICD-10-CM | POA: Diagnosis not present

## 2024-05-17 DIAGNOSIS — R002 Palpitations: Secondary | ICD-10-CM

## 2024-05-17 DIAGNOSIS — G4730 Sleep apnea, unspecified: Secondary | ICD-10-CM

## 2024-05-17 DIAGNOSIS — H532 Diplopia: Secondary | ICD-10-CM

## 2024-05-17 DIAGNOSIS — I25119 Atherosclerotic heart disease of native coronary artery with unspecified angina pectoris: Secondary | ICD-10-CM

## 2024-05-17 DIAGNOSIS — I340 Nonrheumatic mitral (valve) insufficiency: Secondary | ICD-10-CM | POA: Diagnosis not present

## 2024-05-17 DIAGNOSIS — I25118 Atherosclerotic heart disease of native coronary artery with other forms of angina pectoris: Secondary | ICD-10-CM

## 2024-05-17 DIAGNOSIS — E039 Hypothyroidism, unspecified: Secondary | ICD-10-CM | POA: Diagnosis not present

## 2024-05-17 DIAGNOSIS — E785 Hyperlipidemia, unspecified: Secondary | ICD-10-CM | POA: Diagnosis not present

## 2024-05-17 NOTE — Patient Instructions (Signed)
 Medication Instructions:  Your physician recommends that you continue on your current medications as directed. Please refer to the Current Medication list given to you today.  *If you need a refill on your cardiac medications before your next appointment, please call your pharmacy*  Lab Work: NONE ordered at this time of appointment   Testing/Procedures: Your physician has recommended that you have a sleep study. This test records several body functions during sleep, including: brain activity, eye movement, oxygen and carbon dioxide blood levels, heart rate and rhythm, breathing rate and rhythm, the flow of air through your mouth and nose, snoring, body muscle movements, and chest and belly movement.   Follow-Up: At [facility], you and your health needs are our priority.  As part of our continuing mission to provide you with exceptional heart care, our providers are all part of one team.  This team includes your primary Cardiologist (physician) and Advanced Practice Providers or APPs (Physician Assistants and Nurse Practitioners) who all work together to provide you with the care you need, when you need it.  Your next appointment:    Keep follow up   Provider:   Alm Clay, MD    We recommend signing up for the patient portal called MyChart.  Sign up information is provided on this After Visit Summary.  MyChart is used to connect with patients for Virtual Visits (Telemedicine).  Patients are able to view lab/test results, encounter notes, upcoming appointments, etc.  Non-urgent messages can be sent to your provider as well.   To learn more about what you can do with MyChart, go to [email or web address].

## 2024-05-19 DIAGNOSIS — H5000 Unspecified esotropia: Secondary | ICD-10-CM | POA: Diagnosis not present

## 2024-05-19 DIAGNOSIS — H532 Diplopia: Secondary | ICD-10-CM | POA: Diagnosis not present

## 2024-05-19 DIAGNOSIS — H4920 Sixth [abducent] nerve palsy, unspecified eye: Secondary | ICD-10-CM | POA: Diagnosis not present

## 2024-05-23 ENCOUNTER — Other Ambulatory Visit: Payer: Self-pay | Admitting: Family Medicine

## 2024-05-23 DIAGNOSIS — G4762 Sleep related leg cramps: Secondary | ICD-10-CM

## 2024-05-23 DIAGNOSIS — E785 Hyperlipidemia, unspecified: Secondary | ICD-10-CM

## 2024-05-23 DIAGNOSIS — I10 Essential (primary) hypertension: Secondary | ICD-10-CM

## 2024-05-23 DIAGNOSIS — Z125 Encounter for screening for malignant neoplasm of prostate: Secondary | ICD-10-CM

## 2024-05-23 DIAGNOSIS — E039 Hypothyroidism, unspecified: Secondary | ICD-10-CM

## 2024-05-26 ENCOUNTER — Other Ambulatory Visit

## 2024-05-27 ENCOUNTER — Other Ambulatory Visit

## 2024-05-27 DIAGNOSIS — E039 Hypothyroidism, unspecified: Secondary | ICD-10-CM | POA: Diagnosis not present

## 2024-05-27 DIAGNOSIS — E785 Hyperlipidemia, unspecified: Secondary | ICD-10-CM

## 2024-05-27 DIAGNOSIS — Z125 Encounter for screening for malignant neoplasm of prostate: Secondary | ICD-10-CM | POA: Diagnosis not present

## 2024-05-27 DIAGNOSIS — I10 Essential (primary) hypertension: Secondary | ICD-10-CM | POA: Diagnosis not present

## 2024-05-27 DIAGNOSIS — G4762 Sleep related leg cramps: Secondary | ICD-10-CM | POA: Diagnosis not present

## 2024-05-27 LAB — BASIC METABOLIC PANEL WITH GFR
BUN: 15 mg/dL (ref 6–23)
CO2: 24 meq/L (ref 19–32)
Calcium: 9 mg/dL (ref 8.4–10.5)
Chloride: 106 meq/L (ref 96–112)
Creatinine, Ser: 0.98 mg/dL (ref 0.40–1.50)
GFR: 78.47 mL/min (ref >60.00)
Glucose, Bld: 107 mg/dL — ABNORMAL HIGH (ref 70–99)
Potassium: 3.9 meq/L (ref 3.5–5.1)
Sodium: 142 meq/L (ref 135–145)

## 2024-05-27 LAB — TSH: TSH: 2.85 u[IU]/mL (ref 0.35–5.50)

## 2024-05-27 LAB — MICROALBUMIN / CREATININE URINE RATIO
Creatinine,U: 221.1 mg/dL
Microalb Creat Ratio: 13 mg/g (ref 0.0–30.0)
Microalb, Ur: 2.9 mg/dL — ABNORMAL HIGH (ref 0.0–1.9)

## 2024-05-27 LAB — CK: Total CK: 72 U/L (ref 17–232)

## 2024-05-27 LAB — PSA: PSA: 0.35 ng/mL (ref 0.10–4.00)

## 2024-05-27 LAB — MAGNESIUM: Magnesium: 1.9 mg/dL (ref 1.5–2.5)

## 2024-05-28 ENCOUNTER — Ambulatory Visit: Payer: Self-pay | Admitting: Family Medicine

## 2024-05-31 ENCOUNTER — Ambulatory Visit (INDEPENDENT_AMBULATORY_CARE_PROVIDER_SITE_OTHER)

## 2024-05-31 VITALS — BP 120/80 | Ht 63.0 in | Wt 186.0 lb

## 2024-05-31 DIAGNOSIS — Z0000 Encounter for general adult medical examination without abnormal findings: Secondary | ICD-10-CM | POA: Diagnosis not present

## 2024-05-31 NOTE — Progress Notes (Signed)
 Because this visit was a virtual/telehealth visit,  certain criteria was not obtained, such a blood pressure, CBG if applicable, and timed get up and go. Any medications not marked as taking were not mentioned during the medication reconciliation part of the visit. Any vitals not documented were not able to be obtained due to this being a telehealth visit or patient was unable to self-report a recent blood pressure reading due to a lack of equipment at home via telehealth. Vitals that have been documented are verbally provided by the patient.  This visit was performed by a medical professional under my direct supervision. I was immediately available for consultation/collaboration. I have reviewed and agree with the Annual Wellness Visit documentation.  Subjective:   Roger [name] is a 68 y.o. who presents for a Medicare Wellness preventive visit.  As a reminder, Annual Wellness Visits don't include a physical exam, and some assessments may be limited, especially if this visit is performed virtually. We may recommend an in-person follow-up visit with your provider if needed.  Visit Complete: Virtual I connected with  Roger [name] on 05/31/24 by a video and audio enabled telemedicine application and verified that I am speaking with the correct person using two identifiers.  Patient Location: Home  Provider Location: Home Office  I discussed the limitations of evaluation and management by telemedicine. The patient expressed understanding and agreed to proceed.  Vital Signs: Because this visit was a virtual/telehealth visit, some criteria may be missing or patient reported. Any vitals not documented were not able to be obtained and vitals that have been documented are patient reported.   Persons Participating in Visit: Patient.  AWV Questionnaire: No: Patient Medicare AWV questionnaire was not completed prior to this visit.  Cardiac Risk Factors include: advanced age (>80men, >2  women);male gender;dyslipidemia;obesity (BMI >30kg/m2)     Objective:    Today's Vitals   05/31/24 1141  BP: 120/80  Weight: 186 lb (84.4 kg)  Height: 5' 3 (1.6 m)   Body mass index is 32.95 kg/m.     05/31/2024   11:43 AM 03/05/2024    2:45 PM 08/05/2015    6:19 PM 04/09/2015    7:48 AM  Advanced Directives  Does Patient Have a Medical Advance Directive? No No No  No   Would patient like information on creating a medical advance directive? No - Patient declined No - Patient declined Yes - Educational materials given       Data saved with a previous flowsheet row definition    Current Medications (verified) Outpatient Encounter Medications as of 05/31/2024  Medication Sig   acetaminophen  (TYLENOL ) 325 MG tablet Take 650 mg by mouth every 6 (six) hours as needed for moderate pain (pain score 4-6).   amLODipine  (NORVASC ) 10 MG tablet Take 1 tablet (10 mg total) by mouth daily.   aspirin  EC 81 MG tablet Take 1 tablet (81 mg total) by mouth daily. Swallow whole.   atorvastatin  (LIPITOR) 80 MG tablet Take 1 tablet (80 mg total) by mouth daily.   EPINEPHrine  0.3 mg/0.3 mL IJ SOAJ injection Inject 0.3 mg into the muscle as needed for anaphylaxis.   isosorbide  mononitrate (IMDUR ) 30 MG 24 hr tablet Take 1 tablet (30 mg total) by mouth daily.   isosorbide  mononitrate (IMDUR ) 60 MG 24 hr tablet Take 1 tablet (60 mg total) by mouth daily.   levothyroxine  (SYNTHROID ) 50 MCG tablet Take 1 tablet (50 mcg total) by mouth daily.   nitroGLYCERIN  (NITROSTAT ) 0.4 MG SL tablet  Place 1 tablet (0.4 mg total) under the tongue every 5 (five) minutes as needed for chest pain.   No facility-administered encounter medications on file as of 05/31/2024.    Allergies (verified) Patient has no known allergies.   History: Past Medical History:  Diagnosis Date   Bell's palsy x2   recurrent   CAD (coronary artery disease) 2004   with stent, unclear details   Cataract    OD>OS per notes from  [facility]   COVID-19 virus infection 03/2020   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    History of chicken pox    History of hypothyroidism as a child   HLD (hyperlipidemia)    Thyroid  disease    Past Surgical History:  Procedure Laterality Date   COLONOSCOPY     normal per patient   CORONARY PRESSURE/FFR WITH 3D MAPPING N/A 01/14/2024   Procedure: Coronary Pressure/FFR w/3D Mapping;  Surgeon: Swaziland, Peter M, MD;  Location: [facility] INVASIVE CV LAB;  Service: Cardiovascular;  Laterality: N/A;   LEFT HEART CATH AND CORONARY ANGIOGRAPHY N/A 01/14/2024   Procedure: LEFT HEART CATH AND CORONARY ANGIOGRAPHY;  Surgeon: Swaziland, Peter M, MD;  Location: [facility] INVASIVE CV LAB;  Service: Cardiovascular;  Laterality: N/A;   PERCUTANEOUS CORONARY STENT INTERVENTION (PCI-S)  11/08/02   [place]   TONSILLECTOMY     TONSILLECTOMY AND ADENOIDECTOMY Bilateral    Family History  Problem Relation Age of Onset   Cancer Mother 19       colon   Pancreatic cancer Mother    Cancer Father 4       prostate   CAD Father 63       massive MI   CAD Paternal Uncle    Alcoholism Paternal Uncle    Stroke Paternal Uncle    Diabetes Other        maternal side   Social History   Socioeconomic History   Marital status: Married    Spouse name: Not on file   Number of children: Not on file   Years of education: Not on file   Highest education level: 12th grade  Occupational History   Not on file  Tobacco Use   Smoking status: Former    Current packs/day: 0.00    Types: Cigarettes    Quit date: 08/26/1982    Years since quitting: 41.7   Smokeless tobacco: Never  Vaping Use   Vaping status: Never Used  Substance and Sexual Activity   Alcohol use: No    Alcohol/week: 0.0 standard drinks of alcohol   Drug use: No   Sexual activity: Not on file  Other Topics Concern   Not on file  Social History Narrative   Lives with wife, daughter with granddaughter   Edu: some college   Occupation: Water quality scientist   Activity: stays active at work, 20,000 steps daily   Diet: poor water, good fruits/vegetables daily   Social Drivers of Corporate investment banker Strain: Low Risk  (05/30/2024)   Overall Financial Resource Strain (CARDIA)    Difficulty of Paying Living Expenses: Not very hard  Food Insecurity: No Food Insecurity (05/30/2024)   Hunger Vital Sign    Worried About Running Out of Food in the Last Year: Never true    Ran Out of Food in the Last Year: Never true  Transportation Needs: No Transportation Needs (05/30/2024)   PRAPARE - Administrator, Civil Service (Medical): No  Lack of Transportation (Non-Medical): No  Physical Activity: Insufficiently Active (05/30/2024)   Exercise Vital Sign    Days of Exercise per Week: 3 days    Minutes of Exercise per Session: 10 min  Stress: No Stress Concern Present (05/30/2024)   [employer] of Occupational Health - Occupational Stress Questionnaire    Feeling of Stress: Not at all  Social Connections: Socially Integrated (05/30/2024)   Social Connection and Isolation Panel    Frequency of Communication with Friends and Family: Three times a week    Frequency of Social Gatherings with Friends and Family: Once a week    Attends Religious Services: More than 4 times per year    Active Member of [employer] or Organizations: Yes    Attends Engineer, structural: More than 4 times per year    Marital Status: Married    Tobacco Counseling Counseling given: Not Answered    Clinical Intake:  Pre-visit preparation completed: Yes  Pain : No/denies pain     BMI - recorded: 32.95 Nutritional Status: BMI > 30  Obese Nutritional Risks: None Diabetes: No  Lab Results  Component Value Date   HGBA1C 5.6 09/27/2019     How often do you need to have someone help you when you read instructions, pamphlets, or other written materials from your doctor or pharmacy?: 1 - Never  Interpreter Needed?:  No  Patient filled out the Questionnaire out on 05/30/2024   Activities of Daily Living     05/30/2024    2:46 PM  In your present state of health, do you have any difficulty performing the following activities:  Hearing? 0  Vision? 0  Difficulty concentrating or making decisions? 0  Walking or climbing stairs? 0  Dressing or bathing? 0  Doing errands, shopping? 0  Preparing Food and eating ? N  Using the Toilet? N  In the past six months, have you accidently leaked urine? N  Do you have problems with loss of bowel control? N  Managing your Medications? N  Managing your Finances? N  Housekeeping or managing your Housekeeping? N    Patient Care Team: Rilla Baller, MD as PCP - General (Family Medicine) Anner Alm ORN, MD as PCP - Cardiology (Cardiology)  I have updated your Care Teams any recent Medical Services you may have received from other providers in the past year.     Assessment:   This is a routine wellness examination for Roger [name].  Hearing/Vision screen Hearing Screening - Comments:: Patient has no difficulties  Vision Screening - Comments:: Patient wears glasses    Goals Addressed             This Visit's Progress    Patient Stated       To get double vision healed       Depression Screen     05/31/2024   11:44 AM 11/28/2023    9:39 AM 05/30/2023   10:46 AM 10/18/2022   10:50 AM 02/22/2020    4:02 PM  PHQ 2/9 Scores  PHQ - 2 Score 2 0 0 0 0  PHQ- 9 Score 2  3 0     Fall Risk     05/30/2024    2:46 PM 11/28/2023    9:39 AM 05/30/2023   10:46 AM 10/18/2022   10:50 AM  Fall Risk   Falls in the past year? 0 0 0 0  Number falls in past yr: 0   0  Injury with Fall? 0  0  Risk for fall due to : No Fall Risks   No Fall Risks  Follow up Falls evaluation completed   Falls evaluation completed    MEDICARE RISK AT HOME:  Medicare Risk at Home Any stairs in or around the home?: (Patient-Rptd) Yes If so, are there any without handrails?:  (Patient-Rptd) No Home free of loose throw rugs in walkways, pet beds, electrical cords, etc?: (Patient-Rptd) Yes Adequate lighting in your home to reduce risk of falls?: (Patient-Rptd) Yes Life alert?: (Patient-Rptd) No Use of a cane, walker or w/c?: (Patient-Rptd) No Grab bars in the bathroom?: (Patient-Rptd) Yes Shower chair or bench in shower?: (Patient-Rptd) No Elevated toilet seat or a handicapped toilet?: (Patient-Rptd) Yes  TIMED UP AND GO:  Was the test performed?  No  Cognitive Function: 6CIT completed        05/31/2024   11:42 AM  6CIT Screen  What Year? 0 points  What month? 0 points  What time? 0 points  Count back from 20 0 points  Months in reverse 0 points  Repeat phrase 0 points  Total Score 0 points    Immunizations Immunization History  Administered Date(s) Administered    sv, Bivalent, Protein Subunit Rsvpref,pf [place]) 12/08/2023   Fluad Quad(high Dose 65+) 07/05/2020   PFIZER(Purple Top)SARS-COV-2 Vaccination 10/25/2019, 11/23/2019   PNEUMOCOCCAL CONJUGATE-20 05/30/2023   Tdap 02/22/2020, 08/23/2022, 12/08/2023   Zoster Recombinant(Shingrix) 12/08/2023    Screening Tests Health Maintenance  Topic Date Due   Colonoscopy  Never done   Zoster Vaccines- Shingrix (2 of 2) 02/02/2024   Influenza Vaccine  03/26/2024   COVID-19 Vaccine (3 - 2025-26 season) 04/26/2024   Medicare Annual Wellness (AWV)  05/31/2025   DTaP/Tdap/Td (4 - Td or Tdap) 12/07/2033   Pneumococcal Vaccine: 50+ Years  Completed   Hepatitis C Screening  Completed   Meningococcal B Vaccine  Aged Out    Health Maintenance Items Addressed:patient declined   Additional Screening:  Vision Screening: Recommended annual ophthalmology exams for early detection of glaucoma and other disorders of the eye. Is the patient up to date with their annual eye exam?  Yes   Dental Screening: Recommended annual dental exams for proper oral hygiene  Community Resource Referral / Chronic Care  Management: CRR required this visit?  No   CCM required this visit?  No   Plan:    I have personally reviewed and noted the following in the patient's chart:   Medical and social history Use of alcohol, tobacco or illicit drugs  Current medications and supplements including opioid prescriptions. Patient is not currently taking opioid prescriptions. Functional ability and status Nutritional status Physical activity Advanced directives List of other physicians Hospitalizations, surgeries, and ER visits in previous 12 months Vitals Screenings to include cognitive, depression, and falls Referrals and appointments  In addition, I have reviewed and discussed with patient certain preventive protocols, quality metrics, and best practice recommendations. A written personalized care plan for preventive services as well as general preventive health recommendations were provided to patient.   Lyle MARLA Right, [place]   05/31/2024   After Visit Summary: (MyChart) Due to this being a telephonic visit, the after visit summary with patients personalized plan was offered to patient via MyChart   Notes: Nothing significant to report at this time.

## 2024-05-31 NOTE — Patient Instructions (Signed)
 Mr. Gauthreaux,  Thank you for taking the time for your Medicare Wellness Visit. I appreciate your continued commitment to your health goals. Please review the care plan we discussed, and feel free to reach out if I can assist you further.  Medicare recommends these wellness visits once per year to help you and your care team stay ahead of potential health issues. These visits are designed to focus on prevention, allowing your provider to concentrate on managing your acute and chronic conditions during your regular appointments.  Please note that Annual Wellness Visits do not include a physical exam. Some assessments may be limited, especially if the visit was conducted virtually. If needed, we may recommend a separate in-person follow-up with your provider.  Ongoing Care Seeing your primary care provider every 3 to 6 months helps us  monitor your health and provide consistent, personalized care.   Referrals If a referral was made during today's visit and you haven't received any updates within two weeks, please contact the referred provider directly to check on the status.  Recommended Screenings:  Health Maintenance  Topic Date Due   Colon Cancer Screening  Never done   Zoster (Shingles) Vaccine (2 of 2) 02/02/2024   Flu Shot  03/26/2024   COVID-19 Vaccine (3 - 2025-26 season) 04/26/2024   Medicare Annual Wellness Visit  05/31/2025   DTaP/Tdap/Td vaccine (4 - Td or Tdap) 12/07/2033   Pneumococcal Vaccine for age over 45  Completed   Hepatitis C Screening  Completed   Meningitis B Vaccine  Aged Out       05/31/2024   11:43 AM  Advanced Directives  Does Patient Have a Medical Advance Directive? No  Would patient like information on creating a medical advance directive? No - Patient declined   Advance Care Planning is important because it: Ensures you receive medical care that aligns with your values, goals, and preferences. Provides guidance to your family and loved ones, reducing  the emotional burden of decision-making during critical moments.  Vision: Annual vision screenings are recommended for early detection of glaucoma, cataracts, and diabetic retinopathy. These exams can also reveal signs of chronic conditions such as diabetes and high blood pressure.  Dental: Annual dental screenings help detect early signs of oral cancer, gum disease, and other conditions linked to overall health, including heart disease and diabetes.  Please see the attached documents for additional preventive care recommendations.

## 2024-05-31 NOTE — Progress Notes (Deleted)
 Because this visit was a virtual/telehealth visit,  certain criteria was not obtained, such a blood pressure, CBG if applicable, and timed get up and go. Any medications not marked as taking were not mentioned during the medication reconciliation part of the visit. Any vitals not documented were not able to be obtained due to this being a telehealth visit or patient was unable to self-report a recent blood pressure reading due to a lack of equipment at home via telehealth. Vitals that have been documented are verbally provided by the patient.   This visit was performed by a medical professional under my direct supervision. I was immediately available for consultation/collaboration. I have reviewed and agree with the Annual Wellness Visit documentation.  Subjective:   Roger [name] is a 68 y.o. who presents for a Medicare Wellness preventive visit.  As a reminder, Annual Wellness Visits don't include a physical exam, and some assessments may be limited, especially if this visit is performed virtually. We may recommend an in-person follow-up visit with your provider if needed.  Visit Complete: {VISITMETHODVS:[phone]}  {AWVVIDEO:32072}  Persons Participating in Visit: {Persons Participating in Visit:32444}  AWV Questionnaire: {AWVQuestionnaire:32338}  Cardiac Risk Factors include: advanced age (>22men, >24 women);male gender;dyslipidemia;obesity (BMI >30kg/m2)     Objective:    Today's Vitals   05/31/24 1141  BP: 120/80  Weight: 186 lb (84.4 kg)  Height: 5' 3 (1.6 m)   Body mass index is 32.95 kg/m.     05/31/2024   11:43 AM 03/05/2024    2:45 PM 08/05/2015    6:19 PM 04/09/2015    7:48 AM  Advanced Directives  Does Patient Have a Medical Advance Directive? No No No  No   Would patient like information on creating a medical advance directive? No - Patient declined No - Patient declined Yes - Educational materials given       Data saved with a previous flowsheet row  definition    Current Medications (verified) Outpatient Encounter Medications as of 05/31/2024  Medication Sig   acetaminophen  (TYLENOL ) 325 MG tablet Take 650 mg by mouth every 6 (six) hours as needed for moderate pain (pain score 4-6).   amLODipine  (NORVASC ) 10 MG tablet Take 1 tablet (10 mg total) by mouth daily.   aspirin  EC 81 MG tablet Take 1 tablet (81 mg total) by mouth daily. Swallow whole.   atorvastatin  (LIPITOR) 80 MG tablet Take 1 tablet (80 mg total) by mouth daily.   EPINEPHrine  0.3 mg/0.3 mL IJ SOAJ injection Inject 0.3 mg into the muscle as needed for anaphylaxis.   isosorbide  mononitrate (IMDUR ) 30 MG 24 hr tablet Take 1 tablet (30 mg total) by mouth daily.   isosorbide  mononitrate (IMDUR ) 60 MG 24 hr tablet Take 1 tablet (60 mg total) by mouth daily.   levothyroxine  (SYNTHROID ) 50 MCG tablet Take 1 tablet (50 mcg total) by mouth daily.   nitroGLYCERIN  (NITROSTAT ) 0.4 MG SL tablet Place 1 tablet (0.4 mg total) under the tongue every 5 (five) minutes as needed for chest pain.   No facility-administered encounter medications on file as of 05/31/2024.    Allergies (verified) Patient has no known allergies.   History: Past Medical History:  Diagnosis Date   Bell's palsy x2   recurrent   CAD (coronary artery disease) 2004   with stent, unclear details   Cataract    OD>OS per notes from [facility]   COVID-19 virus infection 03/2020   Double vision    R eye.  Unknown cause  per pt.    History of chicken pox    History of hypothyroidism as a child   HLD (hyperlipidemia)    Thyroid  disease    Past Surgical History:  Procedure Laterality Date   COLONOSCOPY     normal per patient   CORONARY PRESSURE/FFR WITH 3D MAPPING N/A 01/14/2024   Procedure: Coronary Pressure/FFR w/3D Mapping;  Surgeon: Swaziland, Peter M, MD;  Location[facility] INVASIVE CV LAB;  Service: Cardiovascular;  Laterality: N/A;   LEFT HEART CATH AND CORONARY ANGIOGRAPHY N/A 01/14/2024   Procedure:  LEFT HEART CATH AND CORONARY ANGIOGRAPHY;  Surgeon: Swaziland, Peter M, MD;  Location: [facility] INVASIVE CV LAB;  Service: Cardiovascular;  Laterality: N/A;   PERCUTANEOUS CORONARY STENT INTERVENTION (PCI-S)  11/08/02   OH   TONSILLECTOMY     TONSILLECTOMY AND ADENOIDECTOMY Bilateral    Family History  Problem Relation Age of Onset   Cancer Mother 24       colon   Pancreatic cancer Mother    Cancer Father 59       prostate   CAD Father 25       massive MI   CAD Paternal Uncle    Alcoholism Paternal Uncle    Stroke Paternal Uncle    Diabetes Other        maternal side   Social History   Socioeconomic History   Marital status: Married    Spouse name: Not on file   Number of children: Not on file   Years of education: Not on file   Highest education level: 12th grade  Occupational History   Not on file  Tobacco Use   Smoking status: Former    Current packs/day: 0.00    Types: Cigarettes    Quit date: 08/26/1982    Years since quitting: 41.7   Smokeless tobacco: Never  Vaping Use   Vaping status: Never Used  Substance and Sexual Activity   Alcohol use: No    Alcohol/week: 0.0 standard drinks of alcohol   Drug use: No   Sexual activity: Not on file  Other Topics Concern   Not on file  Social History Narrative   Lives with wife, daughter with granddaughter   Edu: some college   Occupation: Therapist, art   Activity: stays active at work, 20,000 steps daily   Diet: poor water, good fruits/vegetables daily   Social Drivers of Corporate investment banker Strain: Low Risk  (05/30/2024)   Overall Financial Resource Strain (CARDIA)    Difficulty of Paying Living Expenses: Not very hard  Food Insecurity: No Food Insecurity (05/30/2024)   Hunger Vital Sign    Worried About Running Out of Food in the Last Year: Never true    Ran Out of Food in the Last Year: Never true  Transportation Needs: No Transportation Needs (05/30/2024)   PRAPARE - Scientist, research (physical sciences) (Medical): No    Lack of Transportation (Non-Medical): No  Physical Activity: Insufficiently Active (05/30/2024)   Exercise Vital Sign    Days of Exercise per Week: 3 days    Minutes of Exercise per Session: 10 min  Stress: No Stress Concern Present (05/30/2024)   [employer] of Occupational Health - Occupational Stress Questionnaire    Feeling of Stress: Not at all  Social Connections: Socially Integrated (05/30/2024)   Social Connection and Isolation Panel    Frequency of Communication with Friends and Family: Three times a week    Frequency of Social Gatherings  with Friends and Family: Once a week    Attends Religious Services: More than 4 times per year    Active Member of Clubs or Organizations: Yes    Attends Engineer, structural: More than 4 times per year    Marital Status: Married    Tobacco Counseling Counseling given: Not Answered    Clinical Intake:  Pre-visit preparation completed: Yes  Pain : No/denies pain     BMI - recorded: 32.95 Nutritional Status: BMI > 30  Obese Nutritional Risks: None Diabetes: No  Lab Results  Component Value Date   HGBA1C 5.6 09/27/2019     How often do you need to have someone help you when you read instructions, pamphlets, or other written materials from your doctor or pharmacy?: 1 - Never  Interpreter Needed?: No  Information entered by :: Conda Wannamaker [email or web address]   Activities of Daily Living ***    05/30/2024    2:46 PM  In your present state of health, do you have any difficulty performing the following activities:  Hearing? 0  Vision? 0  Difficulty concentrating or making decisions? 0  Walking or climbing stairs? 0  Dressing or bathing? 0  Doing errands, shopping? 0  Preparing Food and eating ? N  Using the Toilet? N  In the past six months, have you accidently leaked urine? N  Do you have problems with loss of bowel control? N  Managing your Medications? N  Managing your Finances? N   Housekeeping or managing your Housekeeping? N    Patient Care Team: Rilla Baller, MD as PCP - General (Family Medicine) Anner Alm ORN, MD as PCP - Cardiology (Cardiology) *** I have updated your Care Teams any recent Medical Services you may have received from other providers in the past year.     Assessment:   This is a routine wellness examination for Roger [name].  Hearing/Vision screen Hearing Screening - Comments:: Patient has no difficulties  Vision Screening - Comments:: Patient wears glasses    Goals Addressed             This Visit's Progress    Patient Stated       To get double vision healed       Depression Screen ***    05/31/2024   11:44 AM 11/28/2023    9:39 AM 05/30/2023   10:46 AM 10/18/2022   10:50 AM 02/22/2020    4:02 PM  PHQ 2/9 Scores  PHQ - 2 Score 2 0 0 0 0  PHQ- 9 Score 2  3 0     Fall Risk ***    05/30/2024    2:46 PM 11/28/2023    9:39 AM 05/30/2023   10:46 AM 10/18/2022   10:50 AM  Fall Risk   Falls in the past year? 0 0 0 0  Number falls in past yr: 0   0  Injury with Fall? 0   0  Risk for fall due to : No Fall Risks   No Fall Risks  Follow up Falls evaluation completed   Falls evaluation completed    MEDICARE RISK AT HOME: *** Medicare Risk at Home Any stairs in or around the home?: (Patient-Rptd) Yes If so, are there any without handrails?: (Patient-Rptd) No Home free of loose throw rugs in walkways, pet beds, electrical cords, etc?: (Patient-Rptd) Yes Adequate lighting in your home to reduce risk of falls?: (Patient-Rptd) Yes Life alert?: (Patient-Rptd) No Use of a cane, walker or w/c?: (Patient-Rptd) No Grab bars  in the bathroom?: (Patient-Rptd) Yes Shower chair or bench in shower?: (Patient-Rptd) No Elevated toilet seat or a handicapped toilet?: (Patient-Rptd) Yes  TIMED UP AND GO:  Was the test performed?  {AMBTIMEDUPGO:[phone]}  Cognitive Function: {CognitiveScreening:32337}        05/31/2024   11:42 AM  6CIT  Screen  What Year? 0 points  What month? 0 points  What time? 0 points  Count back from 20 0 points  Months in reverse 0 points  Repeat phrase 0 points  Total Score 0 points    Immunizations Immunization History  Administered Date(s) Administered    sv, Bivalent, Protein Subunit Rsvpref,pf Marlow) 12/08/2023   Fluad Quad(high Dose 65+) 07/05/2020   PFIZER(Purple Top)SARS-COV-2 Vaccination 10/25/2019, 11/23/2019   PNEUMOCOCCAL CONJUGATE-20 05/30/2023   Tdap 02/22/2020, 08/23/2022, 12/08/2023   Zoster Recombinant(Shingrix) 12/08/2023    Screening Tests Health Maintenance  Topic Date Due   Colonoscopy  Never done   Zoster Vaccines- Shingrix (2 of 2) 02/02/2024   Influenza Vaccine  03/26/2024   COVID-19 Vaccine (3 - 2025-26 season) 04/26/2024   Medicare Annual Wellness (AWV)  05/31/2025   DTaP/Tdap/Td (4 - Td or Tdap) 12/07/2033   Pneumococcal Vaccine: 50+ Years  Completed   Hepatitis C Screening  Completed   Meningococcal B Vaccine  Aged Out    Health Maintenance Items Addressed: {HMMCR (Optional):30011}  Additional Screening:  Vision Screening: Recommended annual ophthalmology exams for early detection of glaucoma and other disorders of the eye. Is the patient up to date with their annual eye exam?  {YES/NO:21197} Who is the provider or what is the name of the office in which the patient attends annual eye exams? ***  Dental Screening: Recommended annual dental exams for proper oral hygiene  Community Resource Referral / Chronic Care Management: CRR required this visit?  {YES/NO:21197}  CCM required this visit?  {CCM Required choices:6266068832}   Plan:    I have personally reviewed and noted the following in the patient's chart:   Medical and social history Use of alcohol, tobacco or illicit drugs  Current medications and supplements including opioid prescriptions. {Opioid Prescriptions:[phone]} Functional ability and status Nutritional  status Physical activity Advanced directives List of other physicians Hospitalizations, surgeries, and ER visits in previous 12 months Vitals Screenings to include cognitive, depression, and falls Referrals and appointments  In addition, I have reviewed and discussed with patient certain preventive protocols, quality metrics, and best practice recommendations. A written personalized care plan for preventive services as well as general preventive health recommendations were provided to patient.   Lyle MARLA Right, [place]   05/31/2024   After Visit Summary: {CHL AMB AWV After Visit [phone]}  Notes: {Nurse Notes:32343}

## 2024-06-01 ENCOUNTER — Encounter: Payer: Self-pay | Admitting: Family Medicine

## 2024-06-01 ENCOUNTER — Ambulatory Visit: Admitting: Family Medicine

## 2024-06-01 VITALS — BP 130/74 | HR 75 | Temp 97.9°F | Ht 63.5 in | Wt 183.5 lb

## 2024-06-01 DIAGNOSIS — I6522 Occlusion and stenosis of left carotid artery: Secondary | ICD-10-CM

## 2024-06-01 DIAGNOSIS — Z23 Encounter for immunization: Secondary | ICD-10-CM | POA: Diagnosis not present

## 2024-06-01 DIAGNOSIS — E785 Hyperlipidemia, unspecified: Secondary | ICD-10-CM

## 2024-06-01 DIAGNOSIS — Z1211 Encounter for screening for malignant neoplasm of colon: Secondary | ICD-10-CM

## 2024-06-01 DIAGNOSIS — Z7189 Other specified counseling: Secondary | ICD-10-CM | POA: Diagnosis not present

## 2024-06-01 DIAGNOSIS — I25119 Atherosclerotic heart disease of native coronary artery with unspecified angina pectoris: Secondary | ICD-10-CM | POA: Diagnosis not present

## 2024-06-01 DIAGNOSIS — H02402 Unspecified ptosis of left eyelid: Secondary | ICD-10-CM | POA: Insufficient documentation

## 2024-06-01 DIAGNOSIS — I10 Essential (primary) hypertension: Secondary | ICD-10-CM

## 2024-06-01 DIAGNOSIS — E66811 Obesity, class 1: Secondary | ICD-10-CM

## 2024-06-01 DIAGNOSIS — E039 Hypothyroidism, unspecified: Secondary | ICD-10-CM

## 2024-06-01 DIAGNOSIS — Z0000 Encounter for general adult medical examination without abnormal findings: Secondary | ICD-10-CM | POA: Diagnosis not present

## 2024-06-01 DIAGNOSIS — I6529 Occlusion and stenosis of unspecified carotid artery: Secondary | ICD-10-CM | POA: Insufficient documentation

## 2024-06-01 DIAGNOSIS — H532 Diplopia: Secondary | ICD-10-CM

## 2024-06-01 MED ORDER — LEVOTHYROXINE SODIUM 50 MCG PO TABS: 50.0000 ug | ORAL_TABLET | Freq: Every day | ORAL | 3 refills | Status: AC

## 2024-06-01 NOTE — Assessment & Plan Note (Signed)
 Chronic, stable on amlodipine  and isosorbide 

## 2024-06-01 NOTE — Assessment & Plan Note (Signed)
 Neuro recommends q6 month carotid US  to monitor higher grade stenosis (40-59% on left)

## 2024-06-01 NOTE — Assessment & Plan Note (Signed)
 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 Symptoms are improving with pyridostigmine, has close neuro f/u scheduled for 06/2024.

## 2024-06-01 NOTE — Assessment & Plan Note (Signed)
Chronic, stable. Continue levothyroxine 50mcg daily.  ?

## 2024-06-01 NOTE — Assessment & Plan Note (Signed)
 Preventative protocols reviewed and updated unless pt declined. Discussed healthy diet and lifestyle.

## 2024-06-01 NOTE — Assessment & Plan Note (Signed)
 Now on high dose atorvastatin  80mg  daily. Will return for FLP with new goal LDL <55.  The 10-year ASCVD risk score (Arnett DK, et al., 2019) is: 19.4%   Values used to calculate the score:     Age: 68 years     Clincally relevant sex: Male     Is Non-Hispanic African American: No     Diabetic: No     Tobacco smoker: No     Systolic Blood Pressure: 130 mmHg     Is BP treated: Yes     HDL Cholesterol: 34 mg/dL     Total Cholesterol: 135 mg/dL

## 2024-06-01 NOTE — Progress Notes (Addendum)
 Ph: [phone] Fax: [phone]   Patient ID: Roger [name], male    DOB: [birth year], 68 y.o.   MRN: [id number]  This visit was conducted in person.  BP 130/74 (BP Location: Right Arm)   Pulse 75   Temp 97.9 F (36.6 C) (Oral)   Ht 5' 3.5 (1.613 m)   Wt 183 lb 8 oz (83.2 kg)   SpO2 96%   BMI 32.00 kg/m    CC: CPE Subjective:   HPI: Roger [name] is a 68 y.o. male presenting on 06/01/2024 for Annual Exam (Pt here for CPE/Pt accompanied by his wife Roger [name])   Saw health advisor yesterday for medicare wellness visit. Note reviewed.   No results found.  Flowsheet Row Office Visit from 06/01/2024 in [facility] [place]  PHQ-2 Total Score 0       06/01/2024    8:55 AM 05/30/2024    2:46 PM 11/28/2023    9:39 AM 05/30/2023   10:46 AM 10/18/2022   10:50 AM  Fall Risk   Falls in the past year? 0 0 0 0 0  Number falls in past yr: 0 0   0  Injury with Fall? 0 0   0  Risk for fall due to : No Fall Risks No Fall Risks   No Fall Risks  Follow up Falls evaluation completed Falls evaluation completed   Falls evaluation completed   Recent respiratory illness -now improved  Since last seen, admitted 12/2023 for progressive angina. LHC showed severe 3v disease with 100% occlusion of circumflex, as well as multiple 90% stenosis, no stent targets. Planning to start cardiac rehab - this is still pending approval. H/o LCx stenosis s/p DES 2004. Regularly sees cardiology on aspirin , atorvastatin , amlodipine  and isosorbide  90mg  daily. Most recently seen 04/2024, planned HST to eval for OSA.   Seen again at ER 02/2024 with bilateral CN6 palsy presenting with diplopia, bilat esotropia, and intermittent headache on July 4th. Workup including brain MRI, MRV showing no acute abnormality. 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   Continued diplopia - overall noted improvement since starting pyridostigmine. Now a few days ago developed left droopy eyelid affecting vision. No numbness or other facial weakness noted.   Hypothyroidism  managed with levothyroxine  50mcg daily.   Known carotid stenosis with latest US  02/2024 showing 40-59% stenosis on left, rec q6 mo monitoring per neurology recommendation.   Preventative: COLONOSCOPY - deferred due to above cardiac workup. H/o normal colonoscopy 1990s in [place] . No blood in stool, no bowel changes. Agrees to [employer].  Prostate cancer screen - yearly PSA. No nocturia.  Lung cancer screening - not eligible  Flu - today  COVID vaccine - completed Pfizer 10/2019 Tdap 2021, 07/2022 Prevnar-20 - 05/2023 Shingrix - 11/2023, rpt due Tdap 11/2023 Advanced directive - working on this. Would want wife and daughter to be HCPOA. Full code, but wouldn't want prolonged life support if terminal condition.  Seat belt use discussed Sunscreen use discussed. No changing moles on skin.  Ex smoker - quit remotely Alcohol - none Dentist - q6 mo - due Eye exam yearly - monitoring cataract  Lives with wife, daughter with granddaughter Edu: some college Occupation: Therapist, art Activity: stays active at work, 20,000 steps daily  Diet: poor water, good fruits/vegetables daily  Relevant past medical, surgical, family and social history reviewed and updated as indicated. Interim medical history since our last visit reviewed. Allergies and medications reviewed and updated. Outpatient Medications Prior to Visit  Medication Sig Dispense Refill   pyridostigmine (MESTINON) 60 MG tablet Take 60mg  three times daily for 1 month     acetaminophen  (TYLENOL ) 325 MG tablet Take 650 mg by mouth every 6 (six) hours as needed for moderate pain (pain score 4-6).     amLODipine  (NORVASC ) 10 MG tablet Take 1 tablet (10 mg total) by mouth daily. 180 tablet 3   aspirin   EC 81 MG tablet Take 1 tablet (81 mg total) by mouth daily. Swallow whole.     atorvastatin  (LIPITOR) 80 MG tablet Take 1 tablet (80 mg total) by mouth daily. 90 tablet 3   EPINEPHrine  0.3 mg/0.3 mL IJ SOAJ injection Inject 0.3 mg into the muscle as needed for anaphylaxis. 2 each 1   isosorbide  mononitrate (IMDUR ) 30 MG 24 hr tablet Take 1 tablet (30 mg total) by mouth daily. 90 tablet 3   isosorbide  mononitrate (IMDUR ) 60 MG 24 hr tablet Take 1 tablet (60 mg total) by mouth daily. 90 tablet 3   nitroGLYCERIN  (NITROSTAT ) 0.4 MG SL tablet Place 1 tablet (0.4 mg total) under the tongue every 5 (five) minutes as needed for chest pain. 30 tablet 3   levothyroxine  (SYNTHROID ) 50 MCG tablet Take 1 tablet (50 mcg total) by mouth daily. 90 tablet 3   No facility-administered medications prior to visit.     Per HPI unless specifically indicated in ROS section below Review of Systems  Constitutional:  Negative for activity change, appetite change, chills, fatigue, fever and unexpected weight change.  HENT:  Negative for hearing loss.   Eyes:  Positive for visual disturbance.  Respiratory:  Positive for cough (recent resp infection). Negative for chest tightness, shortness of breath and wheezing.   Cardiovascular:  Negative for chest pain, palpitations and leg swelling.  Gastrointestinal:  Negative for abdominal distention, abdominal pain, blood in stool, constipation, diarrhea, nausea and vomiting.  Genitourinary:  Negative for difficulty urinating and hematuria.  Musculoskeletal:  Negative for arthralgias, myalgias and neck pain.  Skin:  Negative for rash.  Neurological:  Negative for dizziness, seizures, syncope and headaches.  Hematological:  Negative for adenopathy. Does not bruise/bleed easily.  Psychiatric/Behavioral:  Negative for dysphoric mood. The patient is not nervous/anxious.     Objective:  BP 130/74 (BP Location: Right Arm)   Pulse 75   Temp 97.9 F (36.6 C) (Oral)   Ht 5' 3.5  (1.613 m)   Wt 183 lb 8 oz (83.2 kg)   SpO2 96%   BMI 32.00 kg/m   Wt Readings from Last 3 Encounters:  06/01/24 183 lb 8 oz (83.2 kg)  05/31/24 186 lb (84.4 kg)  05/17/24 185 lb (83.9 kg)      Physical Exam Vitals and nursing note reviewed.  Constitutional:      General: He is not in acute distress.    Appearance: Normal appearance. He is well-developed. He is not ill-appearing.  HENT:     Head: Normocephalic and atraumatic.     Right Ear: Hearing, tympanic membrane, ear canal and external ear normal.     Left Ear: Hearing, tympanic membrane, ear canal and external ear normal.     Mouth/Throat:     Mouth: Mucous membranes are moist.     Pharynx: Oropharynx is clear. No oropharyngeal exudate or posterior oropharyngeal erythema.  Eyes:  General: No scleral icterus.    Extraocular Movements:     Right eye: Normal extraocular motion and no nystagmus.     Left eye: Normal extraocular motion and no nystagmus.     Conjunctiva/sclera: Conjunctivae normal.     Comments:  New L upper eyelid droop without weakness to rest of face, able to lift eyebrows bilaterally Improved eye movement with EOMI without nystagmus  Neck:     Thyroid : No thyroid  mass or thyromegaly.  Cardiovascular:     Rate and Rhythm: Normal rate and regular rhythm.     Pulses: Normal pulses.          Radial pulses are 2+ on the right side and 2+ on the left side.     Heart sounds: Normal heart sounds. No murmur heard. Pulmonary:     Effort: Pulmonary effort is normal. No respiratory distress.     Breath sounds: Normal breath sounds. No wheezing, rhonchi or rales.  Abdominal:     General: Bowel sounds are normal. There is no distension.     Palpations: Abdomen is soft. There is no mass.     Tenderness: There is no abdominal tenderness. There is no guarding or rebound.     Hernia: No hernia is present.  Musculoskeletal:        General: Normal range of motion.     Cervical back: Normal range of motion and neck  supple.     Right lower leg: No edema.     Left lower leg: No edema.  Lymphadenopathy:     Cervical: No cervical adenopathy.  Skin:    General: Skin is warm and dry.     Findings: No rash.  Neurological:     General: No focal deficit present.     Mental Status: He is alert and oriented to person, place, and time.     Comments:  CN 2-12 intact FTN intact EOMI No pronator drift  Psychiatric:        Mood and Affect: Mood normal.        Behavior: Behavior normal.        Thought Content: Thought content normal.        Judgment: Judgment normal.       Results for orders placed or performed in visit on 05/27/24  Basic metabolic panel with GFR   Collection Time: 05/27/24  9:12 AM  Result Value Ref Range   Sodium 142 135 - 145 mEq/L   Potassium 3.9 3.5 - 5.1 mEq/L   Chloride 106 96 - 112 mEq/L   CO2 24 19 - 32 mEq/L   Glucose, Bld 107 (H) 70 - 99 mg/dL   BUN 15 6 - 23 mg/dL   Creatinine, Ser 9.01 0.40 - 1.50 mg/dL   GFR 21.52 >39.99 mL/min   Calcium  9.0 8.4 - 10.5 mg/dL  CK   Collection Time: 05/27/24  9:12 AM  Result Value Ref Range   Total CK 72 17 - 232 U/L  Magnesium   Collection Time: 05/27/24  9:12 AM  Result Value Ref Range   Magnesium 1.9 1.5 - 2.5 mg/dL  PSA   Collection Time: 05/27/24  9:12 AM  Result Value Ref Range   PSA 0.35 0.10 - 4.00 ng/mL  TSH   Collection Time: 05/27/24  9:12 AM  Result Value Ref Range   TSH 2.85 0.35 - 5.50 uIU/mL  Microalbumin / creatinine urine ratio   Collection Time: 05/27/24  9:12 AM  Result Value Ref Range  Microalb, Ur 2.9 (H) 0.0 - 1.9 mg/dL   Creatinine,U 778.8 mg/dL   Microalb Creat Ratio 13.0 0.0 - 30.0 mg/g   Lab Results  Component Value Date   CHOL 135 04/12/2024   HDL 34 (L) 04/12/2024   LDLCALC 65 04/12/2024   LDLDIRECT 106.0 08/16/2019   TRIG 218 (H) 04/12/2024   CHOLHDL 4.0 04/12/2024    Assessment & Plan:   Problem List Items Addressed This Visit     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  Pending cardiac rehab.  Continue aggressive medical management with goal LDL <55      Hyperlipidemia with target low density lipoprotein (LDL) cholesterol less than 55 mg/dL (Chronic)   Now on high dose atorvastatin  80mg  daily. Will return for FLP with new goal LDL <55.  The 10-year ASCVD risk score (Arnett DK, et al., 2019) is: 19.4%   Values used to calculate the score:     Age: 44 years     Clincally relevant sex: Male     Is Non-Hispanic African American: No     Diabetic: No     Tobacco smoker: No     Systolic Blood Pressure: 130 mmHg     Is BP treated: Yes     HDL Cholesterol: 34 mg/dL     Total Cholesterol: 135 mg/dL       Relevant Orders   Lipid panel   Obesity, Class I, BMI 30-34.9   Health maintenance examination - Primary (Chronic)   Preventative protocols reviewed and updated unless pt declined. Discussed healthy diet and lifestyle.       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 Symptoms are improving with pyridostigmine, has close neuro f/u scheduled for 06/2024.       Advanced directives, counseling/discussion (Chronic)   Advanced directive - working on this. Would want wife and daughter to be HCPOA. Full code, but wouldn't want prolonged life support if terminal condition.       Hypothyroidism (acquired)   Chronic, stable. Continue levothyroxine  50mcg daily.       Relevant Medications   levothyroxine  (SYNTHROID ) 50 MCG tablet   Essential hypertension   Chronic, stable on amlodipine  and isosorbide        Droopy eyelid, left   New over last few days Not consistent with stroke or bell's palsy ?MG related - has close neuro f/u planned.  Update if any new neurologic symptoms Did recommend return to ophthalmology for rpt eye exam.       Carotid stenosis   Neuro recommends q6 month  carotid US  to monitor higher grade stenosis (40-59% on left)      Other Visit Diagnoses       Special screening for malignant neoplasms, colon       Relevant Orders   Cologuard        Meds ordered this encounter  Medications   levothyroxine  (SYNTHROID ) 50 MCG tablet    Sig: Take 1 tablet (50 mcg total) by mouth daily.    Dispense:  90 tablet    Refill:  3    Orders Placed This Encounter  Procedures   Flu vaccine HIGH DOSE PF(Fluzone Trivalent)   Cologuard   Lipid panel    Standing Status:   Future    Expiration Date:   06/01/2025    Patient Instructions  Flu shot today  Get  second shingles shot at local pharmacy.  Schedule eye doctor appointment, keep neurology appointment.  Return fasting for cholesterol levels - schedule appointment up front.  Good to see you today  Return as needed or in 6 months for follow up visit.   Follow up plan: Return in about 6 months (around 11/30/2024) for follow up visit.  Anton Blas, MD

## 2024-06-01 NOTE — Assessment & Plan Note (Signed)
 Appreciate cardiology care.  Pending cardiac rehab.  Continue aggressive medical management with goal LDL <55

## 2024-06-01 NOTE — Assessment & Plan Note (Signed)
 New over last few days Not consistent with stroke or bell's palsy ?MG related - has close neuro f/u planned.  Update if any new neurologic symptoms Did recommend return to ophthalmology for rpt eye exam.

## 2024-06-01 NOTE — Addendum Note (Signed)
 Addended by: DELORES, Lorenda Grecco L on: 06/01/2024 10:08 AM   Modules accepted: Orders

## 2024-06-01 NOTE — Patient Instructions (Addendum)
 Flu shot today  Get second shingles shot at local pharmacy.  Schedule eye doctor appointment, keep neurology appointment.  Return fasting for cholesterol levels - schedule appointment up front.  Good to see you today  Return as needed or in 6 months for follow up visit.

## 2024-06-01 NOTE — Assessment & Plan Note (Addendum)
 Advanced directive - working on this. Would want wife and daughter to be HCPOA. Full code, but wouldn't want prolonged life support if terminal condition.

## 2024-06-03 ENCOUNTER — Other Ambulatory Visit (INDEPENDENT_AMBULATORY_CARE_PROVIDER_SITE_OTHER)

## 2024-06-03 DIAGNOSIS — E785 Hyperlipidemia, unspecified: Secondary | ICD-10-CM

## 2024-06-03 LAB — LIPID PANEL
Cholesterol: 96 mg/dL (ref 0–200)
HDL: 31.9 mg/dL — ABNORMAL LOW (ref >39.00)
LDL Cholesterol: 43 mg/dL (ref 0–99)
NonHDL: 64.54
Total CHOL/HDL Ratio: 3
Triglycerides: 110 mg/dL (ref 0.0–149.0)
VLDL: 22 mg/dL (ref 0.0–40.0)

## 2024-06-07 ENCOUNTER — Ambulatory Visit: Attending: Cardiology | Admitting: Cardiology

## 2024-06-07 ENCOUNTER — Encounter: Payer: Self-pay | Admitting: Cardiology

## 2024-06-07 ENCOUNTER — Ambulatory Visit: Payer: Self-pay | Admitting: Family Medicine

## 2024-06-07 VITALS — BP 146/74 | HR 75 | Resp 16 | Ht 63.0 in | Wt 187.4 lb

## 2024-06-07 DIAGNOSIS — E785 Hyperlipidemia, unspecified: Secondary | ICD-10-CM

## 2024-06-07 DIAGNOSIS — I200 Unstable angina: Secondary | ICD-10-CM

## 2024-06-07 DIAGNOSIS — I25119 Atherosclerotic heart disease of native coronary artery with unspecified angina pectoris: Secondary | ICD-10-CM

## 2024-06-07 DIAGNOSIS — I340 Nonrheumatic mitral (valve) insufficiency: Secondary | ICD-10-CM

## 2024-06-07 DIAGNOSIS — I6522 Occlusion and stenosis of left carotid artery: Secondary | ICD-10-CM | POA: Diagnosis not present

## 2024-06-07 DIAGNOSIS — I10 Essential (primary) hypertension: Secondary | ICD-10-CM | POA: Diagnosis not present

## 2024-06-07 NOTE — Progress Notes (Unsigned)
 Cardiology Office Note:  .   Date:  06/10/2024  ID:  Roger [name], DOB 1956/05/06, MRN [id number] PCP: Rilla Baller, MD  [facility] Providers Cardiologist:  Alm Clay, MD     Chief Complaint  Patient presents with   Coronary artery disease involving native coronary artery of    Chest pain pretty well controlled with amlodipine  at 10 mg and Imdur  at 90 mg.  Still reluctant to go through procedures.   Follow-up    3 months   Coronary Artery Disease    Patient Profile: .     Roger [name] is a mildly obese 68 y.o. male non-smoker with a PMH notable for CAD-LCx PCI 2-4 with HTN HLD who presents here for 1 month follow-up.   CAD (cath for progressive angina-2004) severe LCx stenosis-DES PCI with Cordis 2.5 x 18 mm stent; was also noted to have additional 40 to 50% stenoses elsewhere.   [name], [place] - [facility]) Lost to cardiology follow-up since 2012 Hyperlipidemia Hypertension   Roger [name] is an mildly obese 68 y.o. male non-smoker with a PMH notable for CAD-PCI LCx in 2004 hyperlipidemia who was seen for initial consult on Jan 12, 2024 with complaints of Exertional Dyspnea/Cardiac Clearance for Colonoscopy at the request of Rilla Baller, MD.  He complained of symptoms that were somewhat concerning for progressive angina described as exertional dyspnea and chest tightness that occurred grossly with physical exertion such as walking.  He had 1 significant episode that caused him to go home from work feeling faint hot and sweaty.  Noted high blood pressures at that time. => I scheduled him for urgent catheterization which was performed on 01/14/2024 by Dr. [place] (reviewed below). => Cardiac cath showed basically occluded LCx-OM2 stent as well as an occluded inferior branch of OM1.  RFR borderline lesion in proximal LAD with diffuse disease throughout the LAD.  Tandem 90% lesions in the PDA as well as PL as well as lesions in both diagonal branches not favorable for  PCI.  Could consider CABG if symptoms persist  He was seen for post-cath follow-up by Jackee Alberts, NP on increased dose of Imdur  60 mg daily, amlodipine  5 mg daily and statin.  He still describes some sharp left-sided rib cage pain with walking.  Usually relieved with 2 doses of nitroglycerin .  Symptoms are still reproducible with exercise.  Felt more tired but no dizziness. => Suggested cardiac rehab.      Roger [name] has subsequently been seen several times by Katlyn West, NP: July 17: This was a ER follow-up after presenting with diplopia.  Had a negative workup with CT MRI and MRV being normal.  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  He was doing relatively well from a cardiac standpoint.  Noted significant proven with increased dose of Imdur .  1 episode of chest pain requiring nitroglycerin .  He was also noting right sided chest discomfort that was intermittent.  This usually resolved spontaneously.  He was planning to see neurology for his diplopia.  Remains somewhat bradycardic and therefore not on beta-blocker.  Imdur  further increased to 90 mg daily.  Carotid Dopplers ordered. August 7: Feeling better overall.  Occasional sharp chest pain lasting less than a minute.  Once or twice.  No Nexlizet requirement.  Denied any CHF symptoms. => Amlodipine  increased to 10 mg daily.  Had upcoming neurology follow-up for diplopia September 22: Had just returned from a cruise that he did well  on.  Had not required nitroglycerin .  Still having some right sided sharp chest discomfort.  Otherwise doing fine he acknowledges being relatively and active.  Still having some double vision issues with microvascular ischemia and is also undergoing evaluation for myasthenia gravis. => Recommended starting cardiac rehab.  No presyncope or syncope episodes.  Monitor reviewed.  Avoiding beta-blockers.  Suggested sleep study. . Subjective  Discussed the use of AI scribe  software for clinical note transcription with the patient, who gave verbal consent to proceed.  History of Present Illness Roger [name] is a 68 year old male with coronary artery disease who presents with right-sided chest pain.  He experiences sharp, intermittent right-sided chest pain that occurs even at rest and is not associated with physical activity. The pain does not last long and is not accompanied by shortness of breath, heart racing, dizziness, or leg swelling. He has not experienced exertional chest pain or shortness of breath during activities such as walking on a cruise.  He has a history of coronary artery disease with a stent placed in the circumflex artery in 2004, which is now closed along with the OM2 branch. He has small branch disease with significant narrowing in the LAD and right coronary artery branches. A heart catheterization in May 2025 showed normal pump function and mild mitral regurgitation. His current medications include amlodipine  10 mg, atorvastatin  80 mg, aspirin , and Imdur  90 mg (30 mg and 60 mg doses).  He has been evaluated for myasthenia gravis due to symptoms including eyelid drooping that started about a week ago. Blood work did not confirm the diagnosis, but treatment was initiated to observe the response. He has to force his eyelid open and finds it difficult to keep it open.  No blood in stools, dark stools, or changes in urine. He has not experienced any pass-out spells or woozy spells. He has not had any recent episodes requiring nitroglycerin  since July when he was in [place] .  Recent cholesterol levels from October show an LDL of 43, improved from 65 in August. His A1c is 5.6. He has been evaluated for carotid artery disease with dopplers showing less than 40% stenosis in the right internal carotid and 40-60% in the left, with normal vertebral and subclavian arteries. A recent monitor showed sinus rhythm with low premature beat rate and benign short atrial  runs.  He recently returned from a cruise and has a Education officer, community for exercise.  Cardiovascular ROS: positive for - chest pain, dyspnea on exertion, palpitations, and exercise intolerance, fatigue. negative for - loss of consciousness, orthopnea, paroxysmal nocturnal dyspnea, rapid heart rate, shortness of breath, or syncope Nishan, TIA ramus VS medication  ROS:  Review of Systems - Negative except symptoms noted above.    Objective   Current Meds  Medication Sig   acetaminophen  (TYLENOL ) 325 MG tablet Take 650 mg by mouth every 6 (six) hours as needed for moderate pain (pain score 4-6).   amLODipine  (NORVASC ) 10 MG tablet Take 1 tablet (10 mg total) by mouth daily.   aspirin  EC 81 MG tablet Take 1 tablet (81 mg total) by mouth daily. Swallow whole.   atorvastatin  (LIPITOR) 80 MG tablet Take 1 tablet (80 mg total) by mouth daily.   EPINEPHrine  0.3 mg/0.3 mL IJ SOAJ injection Inject 0.3 mg into the muscle as needed for anaphylaxis.   isosorbide  mononitrate (IMDUR ) 30 MG & 60 MG 24 hr tablet Take 1 of each tablet (90 mg total) by mouth daily.  levothyroxine  (SYNTHROID ) 50 MCG tablet Take 1 tablet (50 mcg total) by mouth daily.   nitroGLYCERIN  (NITROSTAT ) 0.4 MG SL tablet Place 1 tablet (0.4 mg total) under the tongue every 5 (five) minutes as needed for chest pain.   pyridostigmine (MESTINON) 60 MG tablet Take 60mg  three times daily for 1 month    Studies Reviewed: .        Results LABS Total cholesterol: 135 (03/2024) HDL: 34 (03/2024) LDL: 65 (03/2024) Triglycerides: 218 (03/2024) A1c: 5.6 (03/2024) LDL: 43 (05/2024)  Lab Results  Component Value Date   CHOL 96 06/03/2024   HDL 31.90 (L) 06/03/2024   LDLCALC 43 06/03/2024   LDLDIRECT 106.0 08/16/2019   TRIG 110.0 06/03/2024   CHOLHDL 3 06/03/2024   Lab Results  Component Value Date   NA 142 05/27/2024   K 3.9 05/27/2024   CREATININE 0.98 05/27/2024   GFR 78.47 05/27/2024   GLUCOSE 107 (H) 05/27/2024      DIAGNOSTIC Echocardiogram: Normal pump function, EF 55-60%-no RWMA, mild mitral regurgitation, mild left atrial dilation, calcific aortic sclerosis but no stenosis; normal atrial pressures.  (01/05/2024) Cardiac Catheterization: Stent in circumflex closed, OM2 closed, narrowing in LAD branches, significant narrowing in right coronary artery branches, no suitable targets for stenting (01/14/2024) Dominance: Right: Proximal RCA 40%, RPDA 90% tandem lesions and RPL1 90%.  Proximal LAD 70%, mid LAD 50% (borderline RFR 0.9..  Ostial proximal D1 90% and ostial proximal mid D2 90%.  OM2 stent closed with occlusion of the mid to distal LCx.  Mildly elevated LVEDP.SABRA  On my review of the images, the OM1 bifurcates and the inferior branch is occluded filling via collaterals, the LCx gives off the AV groove and then OM2 with a stent going from LCx into larger branch of OM 2 that is occluded with TIMI I flow in both branches of the OM 2.  The LAD is a relatively small caliber vessel with diffuse disease.  The proximal lesion that has borderline FFR would be treatable, but there are still multiple other lesions to consider as potential culprits.  I agree with Dr. Gib assessment that there is diffuse segmental small vessel disease.  Perhaps the only viable target would be in the ostial PDA but there is another lesion just beyond it. Carotid Doppler: Left side 40-60% stenosis, right side <40% stenosis, vertebral and subclavian arteries normal Holter monitor: Sinus rhythm, rate 48-135 bpm, <1% premature atrial and ventricular beats, 4 atrial runs of 8-16 beats, benign   Risk Assessment/Calculations:      Physical Exam:   VS:  BP (!) 146/74   Pulse 75   Resp 16   Ht 5' 3 (1.6 m)   Wt 187 lb 6.4 oz (85 kg)   SpO2 95%   BMI 33.20 kg/m    Wt Readings from Last 3 Encounters:  06/07/24 187 lb 6.4 oz (85 kg)  06/01/24 183 lb 8 oz (83.2 kg)  05/31/24 186 lb (84.4 kg)      GEN: Well nourished, well  developed in no acute distress; mildly obese but otherwise healthy-appearing HEAD and NECK: No JVD; left carotid bruit; left eye ptosis-almost fully closed-has to manually elevate his lid to open the eye CARDIAC: Normal S1, S2; RRR, no murmurs, rubs, gallops RESPIRATORY:  Clear to auscultation without rales, wheezing or rhonchi ; nonlabored, good air movement. ABDOMEN: Soft, non-tender, non-distended EXTREMITIES:  No edema; No deformity      ASSESSMENT AND PLAN: .    Problem List Items Addressed  This Visit       Cardiology Problems   Carotid stenosis (Chronic)    Carotid artery atherosclerosis with mild stenosis Carotid dopplers show mild stenosis with 40-60% on the left and less than 40% on the right. No significant vertebral or subclavian artery involvement. - Continue current medical management.      Coronary artery disease involving native coronary artery of native heart with angina pectoris - Primary (Chronic)   Coronary artery disease with prior stent and multivessel disease, currently stable angina Coronary artery disease with prior stent in the LCx-OM2  now closed, and multivessel disease.   Intermittent right-sided chest pain not suggestive of cardiac origin. Recent catheterization showed significant LAD and RCA narrowing, unsuitable for stenting. Stable with no exertional angina or heart failure symptoms. Aggressive medical management preferred over surgery due to small artery size and current stability. - Continue amlodipine  10 mg, aspirin , Imdur  90 mg. - Continue atorvastatin  80 mg for aggressive lipid management - Encourage [employer] participation with gradual exercise increase.;  Referral has been placed for cardiac rehab maintenance. - Follow up with Katlyn West in 4 months and cardiologist in 8 months.      Essential hypertension (Chronic)   Blood pressure elevated today, typically around 137/70 mmHg. - Continue current antihypertensive regimen: Amlodipine  10  mg daily plus Imdur  90 mg. -Would add ARB next.      Hyperlipidemia with target low density lipoprotein (LDL) cholesterol less than 55 mg/dL (Chronic)   Hyperlipidemia well-controlled with atorvastatin  80 mg. Recent LDL improved to 43 mg/dL from 65 mg/dL, indicating effective management. - Continue atorvastatin  80 mg.      Mild mitral regurgitation by prior echocardiography (Chronic)   Blood pressure continue be elevated, will add ARB.      Progressive angina (HCC) (Chronic)   Symptoms are no longer progressive in and getting worse.  In fact are getting more stable.  He is starting to get maxed out on potential medications with Ranexa being the next option.  I am not sure how good the CABG candidate he would be but I think for complete revascularization that would probably be the best option if we not able to get him controlled.  He will continue to be followed up in close 3 to 52-month follow-up visits over the next year.          Cardiac Rehabilitation Eligibility Assessment  The patient is ready to start cardiac rehabilitation from a cardiac standpoint.        Follow-Up: Return in about 4 months (around 10/08/2024) for Follow-up with APP, Alternate 4 months with APP, 4 month with MD.  I spent 66 minutes in the care of Duong Fanfan today including reviewing labs (1 minute reviewing notes through Epic), reviewing outside labs from this 1 minute reviewing Care Everywhere labs.  (Total 2 minutes for labs), reviewing studies (I personally reviewed Films with the patient and then by myself, I read the monitor and placed comments, and reviewed the echocardiogram: 11 minutes), face to face time discussing treatment options (26 minutes), reviewing records from notes from my most recent note through  today which is includes a couple PCP notes as well as APP notes: (13 min), 14 minutes dictating, and documenting in the encounter.      Signed, Alm MICAEL Clay, MD, MS Alm Clay, M.D.,  M.S. Interventional Cardiologist  [facility] Pager # [phone]

## 2024-06-07 NOTE — Patient Instructions (Signed)
 Medication Instructions:   No changes *If you need a refill on your cardiac medications before your next appointment, please call your pharmacy*   Lab Work: Not needed    Testing/Procedures:  Not needed  Follow-Up: At [facility], you and your health needs are our priority.  As part of our continuing mission to provide you with exceptional heart care, we have created designated Provider Care Teams.  These Care Teams include your primary Cardiologist (physician) and Advanced Practice Providers (APPs -  Physician Assistants and Nurse Practitioners) who all work together to provide you with the care you need, when you need it.     Your next appointment:   4 month(s)  The format for your next appointment:   In Person  Provider:   Elam Finder NP  and then  8 months Dr Anner    Other Instructions    You can go to [employer] for Exercise

## 2024-06-10 ENCOUNTER — Encounter: Payer: Self-pay | Admitting: Cardiology

## 2024-06-10 DIAGNOSIS — R002 Palpitations: Secondary | ICD-10-CM

## 2024-06-10 NOTE — Assessment & Plan Note (Signed)
 Symptoms are no longer progressive in and getting worse.  In fact are getting more stable.  He is starting to get maxed out on potential medications with Ranexa being the next option.  I am not sure how good the CABG candidate he would be but I think for complete revascularization that would probably be the best option if we not able to get him controlled.  He will continue to be followed up in close 3 to 67-month follow-up visits over the next year.

## 2024-06-10 NOTE — Assessment & Plan Note (Signed)
  Carotid artery atherosclerosis with mild stenosis Carotid dopplers show mild stenosis with 40-60% on the left and less than 40% on the right. No significant vertebral or subclavian artery involvement. - Continue current medical management.

## 2024-06-10 NOTE — Assessment & Plan Note (Signed)
 Blood pressure continue be elevated, will add ARB.

## 2024-06-10 NOTE — Progress Notes (Signed)
 The patient has tolerated routine exercises with Silver sneakers and has been on a cruise without significant angina.  He has atypical chest discomfort episodes but the exertional symptoms are notably improved on current medications.  Need to continue to increase his activity level.  Recommend Cardiac Rehab   Alm Clay, MD

## 2024-06-10 NOTE — Assessment & Plan Note (Signed)
 Coronary artery disease with prior stent and multivessel disease, currently stable angina Coronary artery disease with prior stent in the LCx-OM2  now closed, and multivessel disease.   Intermittent right-sided chest pain not suggestive of cardiac origin. Recent catheterization showed significant LAD and RCA narrowing, unsuitable for stenting. Stable with no exertional angina or heart failure symptoms. Aggressive medical management preferred over surgery due to small artery size and current stability. - Continue amlodipine  10 mg, aspirin , Imdur  90 mg. - Continue atorvastatin  80 mg for aggressive lipid management - Encourage [employer] participation with gradual exercise increase.;  Referral has been placed for cardiac rehab maintenance. - Follow up with Roger [name] in 4 months and cardiologist in 8 months.

## 2024-06-10 NOTE — Assessment & Plan Note (Signed)
 Hyperlipidemia well-controlled with atorvastatin  80 mg. Recent LDL improved to 43 mg/dL from 65 mg/dL, indicating effective management. - Continue atorvastatin  80 mg.

## 2024-06-10 NOTE — Assessment & Plan Note (Signed)
 Blood pressure elevated today, typically around 137/70 mmHg. - Continue current antihypertensive regimen: Amlodipine  10 mg daily plus Imdur  90 mg. -Would add ARB next.

## 2024-06-20 LAB — COLOGUARD: COLOGUARD: NEGATIVE

## 2024-06-30 NOTE — Progress Notes (Signed)
 Today the history is gathered from: 60% - patient  40% - patient's wife today  RECORDS SUMMARY: I have reviewed the note dated 03/05/2024 from [facility], DO who has indicated:  diplopia, 6th cranial nerve palsy  Given these abnormal neurologic findings, a referral to neurology has been recommended.  REFERRING PHYSICIAN: Lane Arthea Locus, MD PRIMARY CARE PHYSICIAN:  Rilla Baller, MD  IMPRESSION/PLAN  Roger [name] is a 68 y.o. male presenting for evaluation of    DIPLOPIA/ ESOTROPIA/ CRANIAL NERVE PALSY/ TREMOR/ POSSIBLE MYASTHENIA GRAVIS/  - Worsening.  - Patient with PMH of CAD, HLD, intermittent diplopia, Bell's Palsy x2 and hypothyroidism, here today with abrupt onset left ptosis for one month. Denies head trauma, illness at time of onset. Able to make facial expressions, smile, frown. No speech impairment, facial pain, eye pain, facial droop. Slight unstable gait. Unchanged diplopia only in left eye. No blurred vision. Headaches once a week, improve with Tylenol . Difficulty falling and staying asleep. - Referral to Neurophthalmology to consult left eye ptosis and evaluate for ocular Myasthenia Gravis. - Referral to [facility] for small fiber EMG for recurrent ptosis.  - Start Prednisone : Take 60 mg daily for two days then decrease to 50 mg daily for two days then decrease to 40 mg daily for 2 days then decrease to 30 mg daily for two days then decrease to 20 mg daily for 2 days then decrease to 10 mg daily for two days then stop. - Stop Mestinon 60 mg TID, do not refill. No improvement in vision and suspect possible ocular MG vs. generalized MG.  Medications previously tried: Mestinon 60 mg TID - no improvement in diplopia.  Follow-up with Dr. Lane in 08/31/2024.  p=4   CHIEF COMPLAINT & HPI  Roger [name] is a 68 y.o. male presenting for evaluation of: Chief Complaint  Patient presents with  . DIPLOPIA/ ESOTROPIA/ CRANIAL NERVE PALSY    DIPLOPIA/ ESOTROPIA/  CRANIAL NERVE PALSY/ TREMOR/ POSSIBLE MYASTHENIA GRAVIS/ Patient with PMH of CAD, HLD, intermittent diplopia, Bell's Palsy x2 and hypothyroidism, here today with abrupt onset left ptosis for one month. Reports past left side Bell's palsy, states no left eye droop with this. Denies head trauma, illness at time of onset. Will have to manually raise his eyelid. No one sided or general weakness. Able to make facial expressions, smile, frown. No speech impairment, facial pain, eye pain, facial droop. Has alternated ice and heat at left eye with no noticeable improvement. Slight jaw pain at right mandibular region only. Intermittent dysphagia, no choking. Slight unstable gait. Unchanged diplopia only in left eye. No blurred vision. Wearing prescribed glasses. Occasional floaters. Intermittent headaches once a week at left frontal region with pressure. Previously headaches were occurring less than once a week. Will take OTC Tylenol  650 mg as needed for headaches, this will improve symptoms. Increased drowsiness since onset of left ptosis. Denies dizziness. Difficulty falling and staying asleep. Seen by Cardiology on 06/07/2024, recommended no surgical or stenting intervention for left carotid artery stenosis, will continue medical management with Amlodipine , Atorvastatin , Aspirin  and Imdur . Taking Mestinon 60 mg TID, no improvement in vision. Additional concern for tremor noticeable in left hand occurring with movement.  DATA SUMMARY: 04/08/2024 LONG TERM MONITOR (3-14 DAYS) IMPRESSION: This result has an attachment that is not available.  .  ~14-day Zio patch monitor (July-August 2025)  .  Predominant underlying rhythm is sinus rhythm with an average heart  rate of 72 and a range of 48 to 135 bpm.  Wenkebach block noted, and the  absolute minimum heart rate of 28 bpm was in the setting of Wenckebach  block with a dropped beat.  .  Rare (<1%) PACs and PVCs with couplets and triplets also noted.  .  4 atrial  runs: Fastest was 8 beats with a rate range of 107-182 bpm and  average 161 bpm; the longest was 16 beats with an average rate of 147  beats and a range of 124 to 169 bpm.  Both these were noted on patient  triggered..   03/17/2024 VAS US  CAROTID Right Carotid: Velocities in the right ICA are consistent with a 1-39% stenosis.                 The extracranial vessels were near-normal with only minimal wall                 thickening or plaque. Minimal soft plaque noted in the carotid                 artery.  Left Carotid: Velocities in the left ICA are consistent with a 40-59% stenosis,                high-end of range. Moderate mixed plaque noted int he left prox                ICA.  Vertebrals:  Bilateral vertebral arteries demonstrate antegrade flow.  Subclavians: Normal flow hemodynamics were seen in bilateral subclavian               arteries.   03/05/2024 MR MRV HEAD WWO CONTRAST IMPRESSION:  MRI HEAD:   1. No acute intracranial abnormality.  2. Moderately advanced chronic microvascular ischemic disease with a  few scattered remote lacunar infarcts about the hemispheric cerebral  white matter.  3. Mild generalized cerebral atrophy.   MRV HEAD:   Negative intracranial MRV. No evidence for dural sinus thrombosis.   03/05/2024 CT HEAD WO CONTRAST IMPRESSION:  No CT evidence of acute intracranial abnormality.  Chronic microvascular ischemic changes.   10/18/2022 CT HEAD WO CONTRAST IMPRESSION:  Periventricular white matter changes consistent with chronic small  vessel ischemia. No acute intracranial process identified.   08/14/2020 CT HEAD WO CONTRAST IMPRESSION:  1. No acute intracranial process.  2. Extensive hypodensities throughout the periventricular white  matter, stable since previous MRI. Findings most consistent with  chronic small vessel ischemic changes.   12/06/2019 MR CSPINE WO CONTRAST IMPRESSION: - At C3-4. C4-5: disc bulging and facet hypertrophy  with mild spinal  stenosis and severe biforaminal stenosis; no cord signal abnormalities.  - At C5-6: facet hypertrophy with moderate biforaminal stenosis.  - At C6-7: uncovertebral joint hypertrophy with mild biforaminal stenosis.   11/01/2019 MR CSPINE WO CONTRAST IMPRESSION: Unremarkable MRI scan of the orbits with and without contrast.   Slight enlargement of extraocular muscles which may be related to thyroid   disease.   11/01/2019 MRA HEAD WO CONTRAST IMPRESSION: Unremarkable MRI of the brain showing no significant stenosis  of the large and medium sized intracranial vessels.  No definite aneurysms  are noted.   11/01/2019 MR BRAIN WWO CONTRAST IMPRESSION: Slightly abnormal MRI scan of the brain showing  age-appropriate changes of small vessel disease and chronic inflammatory  changes in the paranasal sinuses.  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  Visualized upper cervical spine shows  prominent degenerative changes with likely significant spinal stenosis at  C3-4 and C4-5.  VISIT SUMMARIES:   MEDICATIONS Current Outpatient Medications  Medication Sig Dispense Refill  . acetaminophen  (TYLENOL ) 325 MG tablet Take 650 mg by mouth every 4 (four) hours as needed for Pain    . amLODIPine  (NORVASC ) 5 MG tablet Take 5 mg by mouth once daily    . aspirin  81 MG chewable tablet Take 81 mg by mouth once daily    . atorvastatin  (LIPITOR) 20 MG tablet Take 20 mg by mouth once daily    . EPINEPHrine  (EPIPEN ) 0.3 mg/0.3 mL auto-injector Inject 0.3 mg into the muscle as needed for Anaphylaxis    . isosorbide  mononitrate (IMDUR ) 60 MG ER tablet Take 60 mg by mouth once daily Take 1 tablet (60 mg total) by mouth daily. CAN TAKE AN ADDITIONAL 30MG  AS NEEDED FOR ANGINA    . levothyroxine  (SYNTHROID ) 50 MCG tablet Take 50 mcg by mouth once daily Take on an empty stomach with a glass of water at least 30-60 minutes before breakfast.    . nitroGLYcerin   (NITROSTAT ) 0.4 MG SL tablet Place 0.4 mg under the tongue every 5 (five) minutes as needed for Chest pain May take up to 3 doses.    SABRA pyRIDostigmine (MESTINON) 60 mg tablet TAKE 60MG  THREE TIMES DAILY FOR 1 MONTH 270 tablet 1  . aspirin -acetaminophen -caffeine (EXCEDRIN MIGRAINE) 250-250-65 mg per tablet Take 1 tablet by mouth every 6 (six) hours as needed for Pain (Patient not taking: Reported on 04/05/2024)    . multivitamin tablet Take 1 tablet by mouth once daily (Patient not taking: Reported on 04/05/2024)    . predniSONE  (DELTASONE ) 10 MG tablet Take 60 mg daily for two days then decrease to 50 mg daily for two days then decrease to 40 mg daily for 2 days then decrease to 30 mg daily for two days then decrease to 20 mg daily for 2 days then decrease to 10 mg daily for two days then stop 42 tablet 0   No current facility-administered medications for this visit.    ALLERGIES No Known Allergies   EXAM   Vitals:   06/30/24 1317  BP: (!) 150/70  Weight: 84.8 kg (187 lb)  Height: 165.1 cm (5' 5)  PainSc: 0-No pain    Body mass index is 31.12 kg/m.  GENERAL: Pleasant male, in no acute distress.  Normocephalic and atraumatic.  EYES: Significant left eye ptosis today on exam.   MUSCULOSKELETAL: Bulk - Normal Tone - Normal Pronator Drift - Absent bilaterally. Ambulation - Gait and station are mildly unsteady. Romberg - positive  R/L 5/5    Shoulder abduction (deltoid/supraspinatus, axillary/suprascapular n, C5) 5/5    Elbow flexion (biceps brachii, musculoskeletal n, C5-6) 5/5    Elbow extension (triceps, radial n, C7) 5/5    Finger adduction (interossei, ulnar n, T1)  5/5    Hip flexion (iliopsoas, L1/L2) 5/5    Knee flexion (hamstrings, sciatic n, L5/S1)  5/5    Knee extension (quadriceps, femoral n, L3/4) 5/5    Ankle dorsiflexion (tibialis anterior, deep fibular n, L4/5) 5/5    Ankle plantarflexion (gastroc, tibial n, S1)   NEUROLOGICAL: MENTAL STATUS: Patient is  oriented to person, place and time.  Recent memory is intact.  Remote memory is intact.  Attention span and concentration are intact.  Naming, repetition, comprehension and expressive speech are within normal limits.  Patient's fund of knowledge is within normal limits for educational level.  CRANIAL NERVES: Abnormal    CN II (normal visual fields with reported decreased visual  acuity) Abnormal    CN III, IV, VI (right eye adducted, decreased EOMs with nasal nystagmus of the right eye) Normal    CN V (facial sensation is intact bilaterally) Normal    CN VII (facial strength is intact bilaterally) Normal    CN VIII (hearing is intact bilaterally) Normal    CN IX/X (palate elevates midline, normal phonation) Normal    CN XI (shoulder shrug strength is normal and symmetric) Normal    CN XII (tongue protrudes midline)  SENSATION: Intact to pain and temp bilaterally (spinothalamic tracts) Intact to position and vibration bilaterally (dorsal columns)  COORDINATION/CEREBELLAR: Finger to nose testing is within normal limits.     PAST MEDICAL HISTORY Past Medical History:  Diagnosis Date  . Bell's palsy   . Blockage of coronary artery of heart (CMS/HHS-HCC)    stents placed  . CAD (coronary artery disease)    2004, with stent  . Cataract   . Double vision   . Headache   . History of double vision   . History of hypothyroidism as a child   . HLD (hyperlipidemia)   . Thyroid  disease     PAST SURGICAL HISTORY Past Surgical History:  Procedure Laterality Date  . heart stents      FAMILY HISTORY No family history on file.  SOCIAL HISTORY  Social History   Tobacco Use  . Smoking status: Former    Types: Cigarettes  . Smokeless tobacco: Never  Substance Use Topics  . Alcohol use: Never  . Drug use: Never     REVIEW OF SYSTEMS:  13 system ROS form was given to the patient to complete and I have reviewed it.  The form was sent for scan to the patient's EHR.  Pertinent  positives and negatives are mentioned above in the HPI and all other systems are negative.   DATA  I have personally reviewed all of the data outlined below both prior to the appointment and during the appointment with the patient as appropriate.  Initial consult on 04/05/2024  Component Date Value Ref Range Status  . Sedimentation Rate-Automated 04/05/2024 12  0 - 20 mm/hr Final  . C Reactive Protein - [facility] 04/05/2024 1  0 - 10 mg/L Final  . AChR Binding Abs, Serum - [facility] 04/05/2024 <0.07  0.00 - 0.24 nmol/L Final  . AChR Blocking Abs, Serum - [facility] 04/05/2024 11  0 - 25 % Final  . AChR Modulating Ab - [facility] 04/05/2024 0  0 - 45 % Final  . Anti-striation Abs - [facility] 04/05/2024 Negative  Neg:<1:100 Final  . Reflex Information - [facility] 04/05/2024 Comment   Final  . MuSK Antibody - [facility] 04/05/2024 <1.0  U/mL Final      No follow-ups on file.  Payor: [employer] / Plan: [employer] MDR ADV / Product Type: Medicare /   This note is partially written by Greig Pouch, scribe, in the presence of and acting as the scribe of Dr. Arthea Farrow.  I have reviewed, edited and added to the note as needed to reflect my best personal medical judgment.    Dr. Arthea Farrow, MD [facility] [place], [place] Ph:  [phone] Fax:  [phone]

## 2024-07-13 ENCOUNTER — Telehealth (HOSPITAL_COMMUNITY): Payer: Self-pay

## 2024-07-13 NOTE — Telephone Encounter (Signed)
 Attempted to call and schedule patient for cardiac rehab- no answer, left message. Sent MyChart message.

## 2024-07-20 ENCOUNTER — Encounter: Payer: Self-pay | Admitting: Cardiology

## 2024-07-20 DIAGNOSIS — G4733 Obstructive sleep apnea (adult) (pediatric): Secondary | ICD-10-CM | POA: Diagnosis not present

## 2024-07-27 ENCOUNTER — Telehealth (HOSPITAL_COMMUNITY): Payer: Self-pay

## 2024-07-27 NOTE — Telephone Encounter (Signed)
 Called patient to see if he was interested in participating in the Cardiac Rehab Program. Patient will come in for orientation on 12/30 and will attend the 8:15 exercise class.  Sent MyChart message.

## 2024-07-30 ENCOUNTER — Ambulatory Visit

## 2024-07-30 DIAGNOSIS — G4730 Sleep apnea, unspecified: Secondary | ICD-10-CM

## 2024-07-30 NOTE — Procedures (Signed)
   SLEEP STUDY REPORT Patient Information Study Date: 07/20/2024 Patient Name: Roger [name] Patient ID: [id number] Birth Date: 1955-12-11 Age: 68 Gender: Male BMI: 32.8 (W=185 lb, H=5' 3'') Referring Physician: Katlyn West, NP  TEST DESCRIPTION: Home sleep apnea testing was completed using the WatchPat, a Type 1 device, utilizing peripheral arterial tonometry (PAT), chest movement, actigraphy, pulse oximetry, pulse rate, body position and snore. AHI was calculated with apnea and hypopnea using valid sleep time as the denominator. RDI includes apneas, hypopneas, and RERAs. The data acquired and the scoring of sleep and all associated events were performed in accordance with the recommended standards and specifications as outlined in the AASM Manual for the Scoring of Sleep and Associated Events 2.2.0 (2015).  FINDINGS:  1. Severe Obstructive Sleep Apnea with AHI 31.1/hr.  2. No Central Sleep Apnea with pAHIc 3/hr.  3. Oxygen desaturations as low as 81%.  4. Moderate snoring was present. O2 sats were < 88% for 4.3 min.  5. Total sleep time was 5 hrs and 16 min.  6. 20.8% of total sleep time was spent in REM sleep.  7. Normal sleep onset latency at 18 min.  8. Shortened REM sleep onset latency at 58 min.  9. Total awakenings were 8. 10. Arrhythmia detection: None  DIAGNOSIS: Severe Obstructive Sleep Apnea (G47.33)  RECOMMENDATIONS: 1. Clinical correlation of these findings is necessary. The decision to treat obstructive sleep apnea (OSA) is usually based on the presence of apnea symptoms or the presence of associated medical conditions such as Hypertension, Congestive Heart Failure, Atrial Fibrillation or Obesity. The most common symptoms of OSA are snoring, gasping for breath while sleeping, daytime sleepiness and fatigue. 2. Initiating apnea therapy is recommended given the presence of symptoms and/or associated conditions. Recommend proceeding with one of the following:  a.  Auto-CPAP therapy with a pressure range of 5-20cm H2O.  b. An oral appliance (OA) that can be obtained from certain dentists with expertise in sleep medicine. These are primarily of use in non-obese patients with mild and moderate disease.  c. An ENT consultation which may be useful to look for specific causes of obstruction and possible treatment options.  d. If patient is intolerant to PAP therapy, consider referral to ENT for evaluation for hypoglossal nerve stimulator. 3. Close follow-up is necessary to ensure success with CPAP or oral appliance therapy for maximum benefit . 4. A follow-up oximetry study on CPAP is recommended to assess the adequacy of therapy and determine the need for supplemental oxygen or the potential need for Bi-level therapy. An arterial blood gas to determine the adequacy of baseline ventilation and oxygenation should also be considered. 5. Healthy sleep recommendations include: adequate nightly sleep (normal 7-9 hrs/night), avoidance of caffeine after noon and alcohol near bedtime, and maintaining a sleep environment that is cool, dark and quiet. 6. Weight loss for overweight patients is recommended. Even modest amounts of weight loss can significantly improve the severity of sleep apnea. 7. Snoring recommendations include: weight loss where appropriate, side sleeping, and avoidance of alcohol before bed. 8. Operation of motor vehicle should be avoided when sleepy.  Signature: Wilbert Bihari, MD; [facility]; Diplomat, American Board of Sleep Medicine Electronically Signed: 07/30/2024 3:40:49 PM

## 2024-08-03 ENCOUNTER — Telehealth: Payer: Self-pay | Admitting: *Deleted

## 2024-08-03 DIAGNOSIS — I25119 Atherosclerotic heart disease of native coronary artery with unspecified angina pectoris: Secondary | ICD-10-CM

## 2024-08-03 DIAGNOSIS — I10 Essential (primary) hypertension: Secondary | ICD-10-CM

## 2024-08-03 DIAGNOSIS — G4733 Obstructive sleep apnea (adult) (pediatric): Secondary | ICD-10-CM

## 2024-08-03 DIAGNOSIS — G4459 Other complicated headache syndrome: Secondary | ICD-10-CM

## 2024-08-03 DIAGNOSIS — I25118 Atherosclerotic heart disease of native coronary artery with other forms of angina pectoris: Secondary | ICD-10-CM

## 2024-08-03 DIAGNOSIS — E66811 Obesity, class 1: Secondary | ICD-10-CM

## 2024-08-03 DIAGNOSIS — R0609 Other forms of dyspnea: Secondary | ICD-10-CM

## 2024-08-03 DIAGNOSIS — K219 Gastro-esophageal reflux disease without esophagitis: Secondary | ICD-10-CM

## 2024-08-03 NOTE — Telephone Encounter (Signed)
-----   Message from Wilbert Bihari sent at 07/30/2024  3:42 PM EST ----- Please let patient know that they have sleep apnea.  Recommend therapeutic CPAP titration for treatment of patient's sleep disordered breathing.  If unable to perform an in lab titration then initiate ResMed auto CPAP from 4 to 15cm H2O with heated humidity and mask of choice and overnight pulse ox on CPAP.

## 2024-08-24 ENCOUNTER — Encounter (HOSPITAL_COMMUNITY)
Admission: RE | Admit: 2024-08-24 | Discharge: 2024-08-24 | Disposition: A | Discharge status: Home / Self Care | Source: Ambulatory Visit | Attending: Cardiology | Admitting: Cardiology

## 2024-08-24 ENCOUNTER — Encounter (HOSPITAL_COMMUNITY): Payer: Self-pay

## 2024-08-24 VITALS — BP 114/70 | HR 75 | Ht 64.25 in | Wt 188.9 lb

## 2024-08-24 DIAGNOSIS — I2089 Other forms of angina pectoris: Secondary | ICD-10-CM | POA: Diagnosis present

## 2024-08-24 NOTE — Progress Notes (Addendum)
 Cardiac Rehab Medication Review by a Nurse  Does the patient  feel that his/her medications are working for him/her?  yes  Has the patient been experiencing any side effects to the medications prescribed?  no  Does the patient measure his/her own blood pressure or blood glucose at home?  no   Does the patient have any problems obtaining medications due to transportation or finances?   no  Understanding of regimen: good Understanding of indications: good Potential of compliance: excellent    Nurse comments: Zavon is taking his medications and has a good understanding of what his medications are for. Yasuo has a BP cuff Lenon does not check his blood pressures on a regular basis. Hadassah Elpidio Quan RN BSN     Hadassah Elpidio Keeyon Privitera RN 08/24/2024 8:07 AM

## 2024-08-24 NOTE — Telephone Encounter (Signed)
**Note De-Identified Fuad Forget Obfuscation** I did a CPAP Titration PA through the [employer] provider portal and it has been approved from 10/18/2024-01/15/2025. Request #[id number]  I have transferred the order to the sleep lab.  I called the pt but got no answer so I left a message on his VM (Ok per [facility]) advising him that [facility] approved coverage of his CPAP Titration and I provided him with the [facility]'s phone number so he can call them to be scheduled. I left the office phone number in the message so he can call us  back if he has any questions or concerns.  I have also sent the pt a [facility] message.

## 2024-08-24 NOTE — Progress Notes (Signed)
 Cardiac Individual Treatment Plan  Patient Details  Name: Roger [name] MRN: [id number] Date of Birth: January 30, 1956 Referring Provider:   Flowsheet Row INTENSIVE CARDIAC REHAB ORIENT from 08/24/2024 in [facility]  Referring Provider Anner Alm ORN, MD    Initial Encounter Date:  Flowsheet Row INTENSIVE CARDIAC REHAB ORIENT from 08/24/2024 in [facility]  Date 08/24/24    Visit Diagnosis: Stable angina  Patient's Home Medications on Admission: Current Medications[1]  Past Medical History: Past Medical History:  Diagnosis Date   Bell's palsy x2   recurrent   CAD (coronary artery disease) 2004   with stent, unclear details   Cataract    OD>OS per notes from [facility]   COVID-19 virus infection 03/2020   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    History of chicken pox    History of hypothyroidism as a child   HLD (hyperlipidemia)    Thyroid  disease     Tobacco Use: Tobacco Use History[2]  Labs: Review Flowsheet  More data exists      Latest Ref Rng & Units 02/22/2020 05/23/2023 12/01/2023 04/12/2024 06/03/2024  Labs for ITP Cardiac and Pulmonary Rehab  Cholestrol 0 - 200 mg/dL 833  813  875  864  96   LDL (calc) 0 - 99 mg/dL 895  887  65  65  43   HDL-C >39.00 mg/dL 65.69  60.59  64.69  34  31.90   Trlycerides 0.0 - 149.0 mg/dL 861.9  826.9  878.9  781  110.0     Capillary Blood Glucose: Lab Results  Component Value Date   GLUCAP 87 03/05/2024     Exercise Target Goals: Exercise Program Goal: Individual exercise prescription set using results from initial 6 min walk test and THRR while considering  patients activity barriers and safety.   Exercise Prescription Goal: Initial exercise prescription builds to 30-45 minutes a day of aerobic activity, 2-3 days per week.  Home exercise guidelines will be given to patient during  program as part of exercise prescription that the participant will acknowledge.  Activity Barriers & Risk Stratification:  Activity Barriers & Cardiac Risk Stratification - 08/24/24 0904       Activity Barriers & Cardiac Risk Stratification   Activity Barriers Balance Concerns;Other (comment)    Comments Double vision    Cardiac Risk Stratification High          6 Minute Walk:  6 Minute Walk     Row Name 08/24/24 0908         6 Minute Walk   Phase Initial     Distance 1461 feet     Walk Time 6 minutes     # of Rest Breaks 0     MPH 2.77     METS 3.13     RPE 11     Perceived Dyspnea  0     VO2 Peak 10.95     Symptoms No     Resting HR 75 bpm     Resting BP 114/70     Resting Oxygen Saturation  96 %     Exercise Oxygen Saturation  during 6 min walk 96 %     Max Ex. HR 104 bpm     Max Ex. BP 158/80     2 Minute Post BP 156/80        Oxygen  Initial Assessment:   Oxygen Re-Evaluation:   Oxygen Discharge (Final Oxygen Re-Evaluation):   Initial Exercise Prescription:  Initial Exercise Prescription - 08/24/24 0900       Date of Initial Exercise RX and Referring Provider   Date 08/24/24    Referring Provider Anner Alm ORN, MD    Expected Discharge Date 11/17/24      Recumbant Bike   Level 1    RPM 30    Watts 25    Minutes 15    METs 2.5      NuStep   Level 2    SPM 85    Minutes 15    METs 2.5      Prescription Details   Frequency (times per week) 3    Duration Progress to 30 minutes of continuous aerobic without signs/symptoms of physical distress      Intensity   THRR 40-80% of Max Heartrate 60-121    Ratings of Perceived Exertion 11-13    Perceived Dyspnea 0-4      Progression   Progression Continue to progress workloads to maintain intensity without signs/symptoms of physical distress.      Resistance Training   Training Prescription Yes    Weight 2 lbs    Reps 10-15          Perform Capillary Blood Glucose checks as  needed.  Exercise Prescription Changes:   Exercise Comments:   Exercise Goals and Review:   Exercise Goals     Row Name 08/24/24 0904             Exercise Goals   Increase Physical Activity Yes       Intervention Provide advice, education, support and counseling about physical activity/exercise needs.;Develop an individualized exercise prescription for aerobic and resistive training based on initial evaluation findings, risk stratification, comorbidities and participant's personal goals.       Expected Outcomes Short Term: Attend rehab on a regular basis to increase amount of physical activity.;Long Term: Add in home exercise to make exercise part of routine and to increase amount of physical activity.;Long Term: Exercising regularly at least 3-5 days a week.       Increase Strength and Stamina Yes       Intervention Provide advice, education, support and counseling about physical activity/exercise needs.;Develop an individualized exercise prescription for aerobic and resistive training based on initial evaluation findings, risk stratification, comorbidities and participant's personal goals.       Expected Outcomes Short Term: Increase workloads from initial exercise prescription for resistance, speed, and METs.;Short Term: Perform resistance training exercises routinely during rehab and add in resistance training at home;Long Term: Improve cardiorespiratory fitness, muscular endurance and strength as measured by increased METs and functional capacity ( )       Able to understand and use rate of perceived exertion (RPE) scale Yes       Intervention Provide education and explanation on how to use RPE scale       Expected Outcomes Short Term: Able to use RPE daily in rehab to express subjective intensity level;Long Term:  Able to use RPE to guide intensity level when exercising independently       Knowledge and understanding of Target Heart Rate Range (THRR) Yes       Intervention  Provide education and explanation of THRR including how the numbers were predicted and where they are located for reference       Expected Outcomes Short Term: Able to state/look up THRR;Long Term: Able to  use THRR to govern intensity when exercising independently;Short Term: Able to use daily as guideline for intensity in rehab       Able to check pulse independently Yes       Intervention Provide education and demonstration on how to check pulse in carotid and radial arteries.;Review the importance of being able to check your own pulse for safety during independent exercise       Expected Outcomes Short Term: Able to explain why pulse checking is important during independent exercise;Long Term: Able to check pulse independently and accurately       Understanding of Exercise Prescription Yes       Intervention Provide education, explanation, and written materials on patient's individual exercise prescription       Expected Outcomes Short Term: Able to explain program exercise prescription;Long Term: Able to explain home exercise prescription to exercise independently          Exercise Goals Re-Evaluation :   Discharge Exercise Prescription (Final Exercise Prescription Changes):   Nutrition:  Target Goals: Understanding of nutrition guidelines, daily intake of sodium 1500mg , cholesterol 200mg , calories 30% from fat and 7% or less from saturated fats, daily to have 5 or more servings of fruits and vegetables.  Biometrics:  Pre Biometrics - 08/24/24 0748       Pre Biometrics   Waist Circumference 45.75 inches    Hip Circumference 41 inches    Waist to Hip Ratio 1.12 %    Triceps Skinfold 20 mm    % Body Fat 33.7 %    Grip Strength 6 kg    Flexibility 9.75 in    Single Leg Stand 0.52 seconds           Nutrition Therapy Plan and Nutrition Goals:   Nutrition Assessments:  MEDIFICTS Score Key: >=70 Need to make dietary changes  40-70 Heart Healthy Diet <= 40 Therapeutic  Level Cholesterol Diet    Picture Your Plate Scores: <59 Unhealthy dietary pattern with much room for improvement. 41-50 Dietary pattern unlikely to meet recommendations for good health and room for improvement. 51-60 More healthful dietary pattern, with some room for improvement.  >60 Healthy dietary pattern, although there may be some specific behaviors that could be improved.    Nutrition Goals Re-Evaluation:   Nutrition Goals Re-Evaluation:   Nutrition Goals Discharge (Final Nutrition Goals Re-Evaluation):   Psychosocial: Target Goals: Acknowledge presence or absence of significant depression and/or stress, maximize coping skills, provide positive support system. Participant is able to verbalize types and ability to use techniques and skills needed for reducing stress and depression.  Initial Review & Psychosocial Screening:  Initial Psych Review & Screening - 08/24/24 1007       Initial Review   Current issues with None Identified      Family Dynamics   Good Support System? Yes   Tyron has his wife and two daughters for support. Piotr has one daughter who lives with him. Romeo's other daughter lives in [place] . Baran has 2 grandchildren.     Barriers   Psychosocial barriers to participate in program There are no identifiable barriers or psychosocial needs.      Screening Interventions   Interventions Encouraged to exercise          Quality of Life Scores:  Quality of Life - 08/24/24 0835       Quality of Life   Select Quality of Life      Quality of Life Scores   Health/Function Pre 29.1 %  Socioeconomic Pre 29.06 %    Psych/Spiritual Pre 29.64 %    Family Pre 30 %    GLOBAL Pre 29.33 %         Scores of 19 and below usually indicate a poorer quality of life in these areas.  A difference of  2-3 points is a clinically meaningful difference.  A difference of 2-3 points in the total score of the Quality of Life Index has been associated with significant  improvement in overall quality of life, self-image, physical symptoms, and general health in studies assessing change in quality of life.  PHQ-9: Review Flowsheet  More data exists      08/24/2024 06/01/2024 05/31/2024 11/28/2023 05/30/2023  Depression screen PHQ 2/9  Decreased Interest 0 0 1 0 0  Down, Depressed, Hopeless 0 0 1 0 0  PHQ - 2 Score 0 0 2 0 0  Altered sleeping 0 0 0 - 0  Tired, decreased energy 0 0 0 - 3  Change in appetite 0 0 0 - 0  Feeling bad or failure about yourself  0 0 0 - 0  Trouble concentrating 0 0 0 - 0  Moving slowly or fidgety/restless 0 0 0 - 0  Suicidal thoughts 0 0 0 - 0  PHQ-9 Score 0 0  2  - 3   Difficult doing work/chores Not difficult at all Not difficult at all Not difficult at all - -    Details       Data saved with a previous flowsheet row definition        Interpretation of Total Score  Total Score Depression Severity:  1-4 = Minimal depression, 5-9 = Mild depression, 10-14 = Moderate depression, 15-19 = Moderately severe depression, 20-27 = Severe depression   Psychosocial Evaluation and Intervention:   Psychosocial Re-Evaluation:   Psychosocial Discharge (Final Psychosocial Re-Evaluation):   Vocational Rehabilitation: Provide vocational rehab assistance to qualifying candidates.   Vocational Rehab Evaluation & Intervention:  Vocational Rehab - 08/24/24 1011       Initial Vocational Rehab Evaluation & Intervention   Assessment shows need for Vocational Rehabilitation No   Mataeo is retired and does not need vocational rehab at this time.         Education: Education Goals: Education classes will be provided on a weekly basis, covering required topics. Participant will state understanding/return demonstration of topics presented.     Core Videos: Exercise    Move It!  Clinical staff conducted group or individual video education with verbal and written material and guidebook.  Patient learns the recommended Pritikin  exercise program. Exercise with the goal of living a long, healthy life. Some of the health benefits of exercise include controlled diabetes, healthier blood pressure levels, improved cholesterol levels, improved heart and lung capacity, improved sleep, and better body composition. Everyone should speak with their doctor before starting or changing an exercise routine.  Biomechanical Limitations Clinical staff conducted group or individual video education with verbal and written material and guidebook.  Patient learns how biomechanical limitations can impact exercise and how we can mitigate and possibly overcome limitations to have an impactful and balanced exercise routine.  Body Composition Clinical staff conducted group or individual video education with verbal and written material and guidebook.  Patient learns that body composition (ratio of muscle mass to fat mass) is a key component to assessing overall fitness, rather than body weight alone. Increased fat mass, especially visceral belly fat, can put us  at increased risk for metabolic  syndrome, type 2 diabetes, heart disease, and even death. It is recommended to combine diet and exercise (cardiovascular and resistance training) to improve your body composition. Seek guidance from your physician and exercise physiologist before implementing an exercise routine.  Exercise Action Plan Clinical staff conducted group or individual video education with verbal and written material and guidebook.  Patient learns the recommended strategies to achieve and enjoy long-term exercise adherence, including variety, self-motivation, self-efficacy, and positive decision making. Benefits of exercise include fitness, good health, weight management, more energy, better sleep, less stress, and overall well-being.  Medical   Heart Disease Risk Reduction Clinical staff conducted group or individual video education with verbal and written material and guidebook.   Patient learns our heart is our most vital organ as it circulates oxygen, nutrients, white blood cells, and hormones throughout the entire body, and carries waste away. Data supports a plant-based eating plan like the Pritikin Program for its effectiveness in slowing progression of and reversing heart disease. The video provides a number of recommendations to address heart disease.   Metabolic Syndrome and Belly Fat  Clinical staff conducted group or individual video education with verbal and written material and guidebook.  Patient learns what metabolic syndrome is, how it leads to heart disease, and how one can reverse it and keep it from coming back. You have metabolic syndrome if you have 3 of the following 5 criteria: abdominal obesity, high blood pressure, high triglycerides, low HDL cholesterol, and high blood sugar.  Hypertension and Heart Disease Clinical staff conducted group or individual video education with verbal and written material and guidebook.  Patient learns that high blood pressure, or hypertension, is very common in the [place] . Hypertension is largely due to excessive salt intake, but other important risk factors include being overweight, physical inactivity, drinking too much alcohol, smoking, and not eating enough potassium from fruits and vegetables. High blood pressure is a leading risk factor for heart attack, stroke, congestive heart failure, dementia, kidney failure, and premature death. Long-term effects of excessive salt intake include stiffening of the arteries and thickening of heart muscle and organ damage. Recommendations include ways to reduce hypertension and the risk of heart disease.  Diseases of Our Time - Focusing on Diabetes Clinical staff conducted group or individual video education with verbal and written material and guidebook.  Patient learns why the best way to stop diseases of our time is prevention, through food and other lifestyle changes.  Medicine (such as prescription pills and surgeries) is often only a Band-Aid on the problem, not a long-term solution. Most common diseases of our time include obesity, type 2 diabetes, hypertension, heart disease, and cancer. The Pritikin Program is recommended and has been proven to help reduce, reverse, and/or prevent the damaging effects of metabolic syndrome.  Nutrition   Overview of the Pritikin Eating Plan  Clinical staff conducted group or individual video education with verbal and written material and guidebook.  Patient learns about the Pritikin Eating Plan for disease risk reduction. The Pritikin Eating Plan emphasizes a wide variety of unrefined, minimally-processed carbohydrates, like fruits, vegetables, whole grains, and legumes. Go, Caution, and Stop food choices are explained. Plant-based and lean animal proteins are emphasized. Rationale provided for low sodium intake for blood pressure control, low added sugars for blood sugar stabilization, and low added fats and oils for coronary artery disease risk reduction and weight management.  Calorie Density  Clinical staff conducted group or individual video education with verbal and written material and  guidebook.  Patient learns about calorie density and how it impacts the Pritikin Eating Plan. Knowing the characteristics of the food you choose will help you decide whether those foods will lead to weight gain or weight loss, and whether you want to consume more or less of them. Weight loss is usually a side effect of the Pritikin Eating Plan because of its focus on low calorie-dense foods.  Label Reading  Clinical staff conducted group or individual video education with verbal and written material and guidebook.  Patient learns about the Pritikin recommended label reading guidelines and corresponding recommendations regarding calorie density, added sugars, sodium content, and whole grains.  Dining Out - Part 1  Clinical staff conducted  group or individual video education with verbal and written material and guidebook.  Patient learns that restaurant meals can be sabotaging because they can be so high in calories, fat, sodium, and/or sugar. Patient learns recommended strategies on how to positively address this and avoid unhealthy pitfalls.  Facts on Fats  Clinical staff conducted group or individual video education with verbal and written material and guidebook.  Patient learns that lifestyle modifications can be just as effective, if not more so, as many medications for lowering your risk of heart disease. A Pritikin lifestyle can help to reduce your risk of inflammation and atherosclerosis (cholesterol build-up, or plaque, in the artery walls). Lifestyle interventions such as dietary choices and physical activity address the cause of atherosclerosis. A review of the types of fats and their impact on blood cholesterol levels, along with dietary recommendations to reduce fat intake is also included.  Nutrition Action Plan  Clinical staff conducted group or individual video education with verbal and written material and guidebook.  Patient learns how to incorporate Pritikin recommendations into their lifestyle. Recommendations include planning and keeping personal health goals in mind as an important part of their success.  Healthy Mind-Set    Healthy Minds, Bodies, Hearts  Clinical staff conducted group or individual video education with verbal and written material and guidebook.  Patient learns how to identify when they are stressed. Video will discuss the impact of that stress, as well as the many benefits of stress management. Patient will also be introduced to stress management techniques. The way we think, act, and feel has an impact on our hearts.  How Our Thoughts Can Heal Our Hearts  Clinical staff conducted group or individual video education with verbal and written material and guidebook.  Patient learns that negative  thoughts can cause depression and anxiety. This can result in negative lifestyle behavior and serious health problems. Cognitive behavioral therapy is an effective method to help control our thoughts in order to change and improve our emotional outlook.  Additional Videos:  Exercise    Improving Performance  Clinical staff conducted group or individual video education with verbal and written material and guidebook.  Patient learns to use a non-linear approach by alternating intensity levels and lengths of time spent exercising to help burn more calories and lose more body fat. Cardiovascular exercise helps improve heart health, metabolism, hormonal balance, blood sugar control, and recovery from fatigue. Resistance training improves strength, endurance, balance, coordination, reaction time, metabolism, and muscle mass. Flexibility exercise improves circulation, posture, and balance. Seek guidance from your physician and exercise physiologist before implementing an exercise routine and learn your capabilities and proper form for all exercise.  Introduction to Yoga  Clinical staff conducted group or individual video education with verbal and written material and guidebook.  Patient  learns about yoga, a discipline of the coming together of mind, breath, and body. The benefits of yoga include improved flexibility, improved range of motion, better posture and core strength, increased lung function, weight loss, and positive self-image. Yogas heart health benefits include lowered blood pressure, healthier heart rate, decreased cholesterol and triglyceride levels, improved immune function, and reduced stress. Seek guidance from your physician and exercise physiologist before implementing an exercise routine and learn your capabilities and proper form for all exercise.  Medical   Aging: Enhancing Your Quality of Life  Clinical staff conducted group or individual video education with verbal and written  material and guidebook.  Patient learns key strategies and recommendations to stay in good physical health and enhance quality of life, such as prevention strategies, having an advocate, securing a Health Care Proxy and Power of Attorney, and keeping a list of medications and system for tracking them. It also discusses how to avoid risk for bone loss.  Biology of Weight Control  Clinical staff conducted group or individual video education with verbal and written material and guidebook.  Patient learns that weight gain occurs because we consume more calories than we burn (eating more, moving less). Even if your body weight is normal, you may have higher ratios of fat compared to muscle mass. Too much body fat puts you at increased risk for cardiovascular disease, heart attack, stroke, type 2 diabetes, and obesity-related cancers. In addition to exercise, following the Pritikin Eating Plan can help reduce your risk.  Decoding Lab Results  Clinical staff conducted group or individual video education with verbal and written material and guidebook.  Patient learns that lab test reflects one measurement whose values change over time and are influenced by many factors, including medication, stress, sleep, exercise, food, hydration, pre-existing medical conditions, and more. It is recommended to use the knowledge from this video to become more involved with your lab results and evaluate your numbers to speak with your doctor.   Diseases of Our Time - Overview  Clinical staff conducted group or individual video education with verbal and written material and guidebook.  Patient learns that according to the CDC, 50% to 70% of chronic diseases (such as obesity, type 2 diabetes, elevated lipids, hypertension, and heart disease) are avoidable through lifestyle improvements including healthier food choices, listening to satiety cues, and increased physical activity.  Sleep Disorders Clinical staff conducted group  or individual video education with verbal and written material and guidebook.  Patient learns how good quality and duration of sleep are important to overall health and well-being. Patient also learns about sleep disorders and how they impact health along with recommendations to address them, including discussing with a physician.  Nutrition  Dining Out - Part 2 Clinical staff conducted group or individual video education with verbal and written material and guidebook.  Patient learns how to plan ahead and communicate in order to maximize their dining experience in a healthy and nutritious manner. Included are recommended food choices based on the type of restaurant the patient is visiting.   Fueling a Banker conducted group or individual video education with verbal and written material and guidebook.  There is a strong connection between our food choices and our health. Diseases like obesity and type 2 diabetes are very prevalent and are in large-part due to lifestyle choices. The Pritikin Eating Plan provides plenty of food and hunger-curbing satisfaction. It is easy to follow, affordable, and helps reduce health risks.  Menu Workshop  Clinical staff conducted group or individual video education with verbal and written material and guidebook.  Patient learns that restaurant meals can sabotage health goals because they are often packed with calories, fat, sodium, and sugar. Recommendations include strategies to plan ahead and to communicate with the manager, chef, or server to help order a healthier meal.  Planning Your Eating Strategy  Clinical staff conducted group or individual video education with verbal and written material and guidebook.  Patient learns about the Pritikin Eating Plan and its benefit of reducing the risk of disease. The Pritikin Eating Plan does not focus on calories. Instead, it emphasizes high-quality, nutrient-rich foods. By knowing the  characteristics of the foods, we choose, we can determine their calorie density and make informed decisions.  Targeting Your Nutrition Priorities  Clinical staff conducted group or individual video education with verbal and written material and guidebook.  Patient learns that lifestyle habits have a tremendous impact on disease risk and progression. This video provides eating and physical activity recommendations based on your personal health goals, such as reducing LDL cholesterol, losing weight, preventing or controlling type 2 diabetes, and reducing high blood pressure.  Vitamins and Minerals  Clinical staff conducted group or individual video education with verbal and written material and guidebook.  Patient learns different ways to obtain key vitamins and minerals, including through a recommended healthy diet. It is important to discuss all supplements you take with your doctor.   Healthy Mind-Set    Smoking Cessation  Clinical staff conducted group or individual video education with verbal and written material and guidebook.  Patient learns that cigarette smoking and tobacco addiction pose a serious health risk which affects millions of people. Stopping smoking will significantly reduce the risk of heart disease, lung disease, and many forms of cancer. Recommended strategies for quitting are covered, including working with your doctor to develop a successful plan.  Culinary   Becoming a Set Designer conducted group or individual video education with verbal and written material and guidebook.  Patient learns that cooking at home can be healthy, cost-effective, quick, and puts them in control. Keys to cooking healthy recipes will include looking at your recipe, assessing your equipment needs, planning ahead, making it simple, choosing cost-effective seasonal ingredients, and limiting the use of added fats, salts, and sugars.  Cooking - Breakfast and Snacks  Clinical staff  conducted group or individual video education with verbal and written material and guidebook.  Patient learns how important breakfast is to satiety and nutrition through the entire day. Recommendations include key foods to eat during breakfast to help stabilize blood sugar levels and to prevent overeating at meals later in the day. Planning ahead is also a key component.  Cooking - Educational Psychologist conducted group or individual video education with verbal and written material and guidebook.  Patient learns eating strategies to improve overall health, including an approach to cook more at home. Recommendations include thinking of animal protein as a side on your plate rather than center stage and focusing instead on lower calorie dense options like vegetables, fruits, whole grains, and plant-based proteins, such as beans. Making sauces in large quantities to freeze for later and leaving the skin on your vegetables are also recommended to maximize your experience.  Cooking - Healthy Salads and Dressing Clinical staff conducted group or individual video education with verbal and written material and guidebook.  Patient learns that vegetables, fruits, whole grains, and legumes are the foundations  of the Pritikin Eating Plan. Recommendations include how to incorporate each of these in flavorful and healthy salads, and how to create homemade salad dressings. Proper handling of ingredients is also covered. Cooking - Soups and [employer] - Soups and Desserts Clinical staff conducted group or individual video education with verbal and written material and guidebook.  Patient learns that Pritikin soups and desserts make for easy, nutritious, and delicious snacks and meal components that are low in sodium, fat, sugar, and calorie density, while high in vitamins, minerals, and filling fiber. Recommendations include simple and healthy ideas for soups and desserts.   Overview     The  Pritikin Solution Program Overview Clinical staff conducted group or individual video education with verbal and written material and guidebook.  Patient learns that the results of the Pritikin Program have been documented in more than 100 articles published in peer-reviewed journals, and the benefits include reducing risk factors for (and, in some cases, even reversing) high cholesterol, high blood pressure, type 2 diabetes, obesity, and more! An overview of the three key pillars of the Pritikin Program will be covered: eating well, doing regular exercise, and having a healthy mind-set.  WORKSHOPS  Exercise: Exercise Basics: Building Your Action Plan Clinical staff led group instruction and group discussion with PowerPoint presentation and patient guidebook. To enhance the learning environment the use of posters, models and videos may be added. At the conclusion of this workshop, patients will comprehend the difference between physical activity and exercise, as well as the benefits of incorporating both, into their routine. Patients will understand the FITT (Frequency, Intensity, Time, and Type) principle and how to use it to build an exercise action plan. In addition, safety concerns and other considerations for exercise and cardiac rehab will be addressed by the presenter. The purpose of this lesson is to promote a comprehensive and effective weekly exercise routine in order to improve patients overall level of fitness.   Managing Heart Disease: Your Path to a Healthier Heart Clinical staff led group instruction and group discussion with PowerPoint presentation and patient guidebook. To enhance the learning environment the use of posters, models and videos may be added.At the conclusion of this workshop, patients will understand the anatomy and physiology of the heart. Additionally, they will understand how Pritikins three pillars impact the risk factors, the progression, and the management  of heart disease.  The purpose of this lesson is to provide a high-level overview of the heart, heart disease, and how the Pritikin lifestyle positively impacts risk factors.  Exercise Biomechanics Clinical staff led group instruction and group discussion with PowerPoint presentation and patient guidebook. To enhance the learning environment the use of posters, models and videos may be added. Patients will learn how the structural parts of their bodies function and how these functions impact their daily activities, movement, and exercise. Patients will learn how to promote a neutral spine, learn how to manage pain, and identify ways to improve their physical movement in order to promote healthy living. The purpose of this lesson is to expose patients to common physical limitations that impact physical activity. Participants will learn practical ways to adapt and manage aches and pains, and to minimize their effect on regular exercise. Patients will learn how to maintain good posture while sitting, walking, and lifting.  Balance Training and Fall Prevention  Clinical staff led group instruction and group discussion with PowerPoint presentation and patient guidebook. To enhance the learning environment the use of posters, models and videos  may be added. At the conclusion of this workshop, patients will understand the importance of their sensorimotor skills (vision, proprioception, and the vestibular system) in maintaining their ability to balance as they age. Patients will apply a variety of balancing exercises that are appropriate for their current level of function. Patients will understand the common causes for poor balance, possible solutions to these problems, and ways to modify their physical environment in order to minimize their fall risk. The purpose of this lesson is to teach patients about the importance of maintaining balance as they age and ways to minimize their risk of  falling.  WORKSHOPS   Nutrition:  Fueling a Ship Broker led group instruction and group discussion with PowerPoint presentation and patient guidebook. To enhance the learning environment the use of posters, models and videos may be added. Patients will review the foundational principles of the Pritikin Eating Plan and understand what constitutes a serving size in each of the food groups. Patients will also learn Pritikin-friendly foods that are better choices when away from home and review make-ahead meal and snack options. Calorie density will be reviewed and applied to three nutrition priorities: weight maintenance, weight loss, and weight gain. The purpose of this lesson is to reinforce (in a group setting) the key concepts around what patients are recommended to eat and how to apply these guidelines when away from home by planning and selecting Pritikin-friendly options. Patients will understand how calorie density may be adjusted for different weight management goals.  Mindful Eating  Clinical staff led group instruction and group discussion with PowerPoint presentation and patient guidebook. To enhance the learning environment the use of posters, models and videos may be added. Patients will briefly review the concepts of the Pritikin Eating Plan and the importance of low-calorie dense foods. The concept of mindful eating will be introduced as well as the importance of paying attention to internal hunger signals. Triggers for non-hunger eating and techniques for dealing with triggers will be explored. The purpose of this lesson is to provide patients with the opportunity to review the basic principles of the Pritikin Eating Plan, discuss the value of eating mindfully and how to measure internal cues of hunger and fullness using the Hunger Scale. Patients will also discuss reasons for non-hunger eating and learn strategies to use for controlling emotional eating.  Targeting Your  Nutrition Priorities Clinical staff led group instruction and group discussion with PowerPoint presentation and patient guidebook. To enhance the learning environment the use of posters, models and videos may be added. Patients will learn how to determine their genetic susceptibility to disease by reviewing their family history. Patients will gain insight into the importance of diet as part of an overall healthy lifestyle in mitigating the impact of genetics and other environmental insults. The purpose of this lesson is to provide patients with the opportunity to assess their personal nutrition priorities by looking at their family history, their own health history and current risk factors. Patients will also be able to discuss ways of prioritizing and modifying the Pritikin Eating Plan for their highest risk areas  Menu  Clinical staff led group instruction and group discussion with PowerPoint presentation and patient guidebook. To enhance the learning environment the use of posters, models and videos may be added. Using menus brought in from [employer], or printed from [employer], patients will apply the Pritikin dining out guidelines that were presented in the [employer] video. Patients will also be able to  practice these guidelines in a variety of provided scenarios. The purpose of this lesson is to provide patients with the opportunity to practice hands-on learning of the Pritikin Dining Out guidelines with actual menus and practice scenarios.  Label Reading Clinical staff led group instruction and group discussion with PowerPoint presentation and patient guidebook. To enhance the learning environment the use of posters, models and videos may be added. Patients will review and discuss the Pritikin label reading guidelines presented in Pritikins Label Reading Educational series video. Using fool labels brought in from local grocery stores and markets, patients will apply the  label reading guidelines and determine if the packaged food meet the Pritikin guidelines. The purpose of this lesson is to provide patients with the opportunity to review, discuss, and practice hands-on learning of the Pritikin Label Reading guidelines with actual packaged food labels. Cooking School  Pritikins [employer] are designed to teach patients ways to prepare quick, simple, and affordable recipes at home. The importance of nutritions role in chronic disease risk reduction is reflected in its emphasis in the overall Pritikin program. By learning how to prepare essential core Pritikin Eating Plan recipes, patients will increase control over what they eat; be able to customize the flavor of foods without the use of added salt, sugar, or fat; and improve the quality of the food they consume. By learning a set of core recipes which are easily assembled, quickly prepared, and affordable, patients are more likely to prepare more healthy foods at home. These workshops focus on convenient breakfasts, simple entres, side dishes, and desserts which can be prepared with minimal effort and are consistent with nutrition recommendations for cardiovascular risk reduction. Cooking [employer] are taught by a armed forces logistics/support/administrative officer (RD) who has been trained by the [employer]. The chef or RD has a clear understanding of the importance of minimizing - if not completely eliminating - added fat, sugar, and sodium in recipes. Throughout the series of Cooking School Workshop sessions, patients will learn about healthy ingredients and efficient methods of cooking to build confidence in their capability to prepare    Cooking School weekly topics:  Adding Flavor- Sodium-Free  Fast and Healthy Breakfasts  Powerhouse Plant-Based Proteins  Satisfying Salads and Dressings  Simple Sides and Sauces  International Cuisine-Spotlight on the [employer] Zones  Delicious Desserts  Savory  Soups  [employer] - Meals in a Astronomer Appetizers and Snacks  Comforting Weekend Breakfasts  One-Pot Wonders   Fast Evening Meals  Landscape Architect Your Pritikin Plate  WORKSHOPS   Healthy Mindset (Psychosocial):  Focused Goals, Sustainable Changes Clinical staff led group instruction and group discussion with PowerPoint presentation and patient guidebook. To enhance the learning environment the use of posters, models and videos may be added. Patients will be able to apply effective goal setting strategies to establish at least one personal goal, and then take consistent, meaningful action toward that goal. They will learn to identify common barriers to achieving personal goals and develop strategies to overcome them. Patients will also gain an understanding of how our mind-set can impact our ability to achieve goals and the importance of cultivating a positive and growth-oriented mind-set. The purpose of this lesson is to provide patients with a deeper understanding of how to set and achieve personal goals, as well as the tools and strategies needed to overcome common obstacles which may arise along the way.  From Head to Heart: The Power of a Healthy  Outlook  Clinical staff led group instruction and group discussion with PowerPoint presentation and patient guidebook. To enhance the learning environment the use of posters, models and videos may be added. Patients will be able to recognize and describe the impact of emotions and mood on physical health. They will discover the importance of self-care and explore self-care practices which may work for them. Patients will also learn how to utilize the 4 Cs to cultivate a healthier outlook and better manage stress and challenges. The purpose of this lesson is to demonstrate to patients how a healthy outlook is an essential part of maintaining good health, especially as they continue their cardiac rehab journey.  Healthy  Sleep for a Healthy Heart Clinical staff led group instruction and group discussion with PowerPoint presentation and patient guidebook. To enhance the learning environment the use of posters, models and videos may be added. At the conclusion of this workshop, patients will be able to demonstrate knowledge of the importance of sleep to overall health, well-being, and quality of life. They will understand the symptoms of, and treatments for, common sleep disorders. Patients will also be able to identify daytime and nighttime behaviors which impact sleep, and they will be able to apply these tools to help manage sleep-related challenges. The purpose of this lesson is to provide patients with a general overview of sleep and outline the importance of quality sleep. Patients will learn about a few of the most common sleep disorders. Patients will also be introduced to the concept of sleep hygiene, and discover ways to self-manage certain sleeping problems through simple daily behavior changes. Finally, the workshop will motivate patients by clarifying the links between quality sleep and their goals of heart-healthy living.   Recognizing and Reducing Stress Clinical staff led group instruction and group discussion with PowerPoint presentation and patient guidebook. To enhance the learning environment the use of posters, models and videos may be added. At the conclusion of this workshop, patients will be able to understand the types of stress reactions, differentiate between acute and chronic stress, and recognize the impact that chronic stress has on their health. They will also be able to apply different coping mechanisms, such as reframing negative self-talk. Patients will have the opportunity to practice a variety of stress management techniques, such as deep abdominal breathing, progressive muscle relaxation, and/or guided imagery.  The purpose of this lesson is to educate patients on the role of stress in their  lives and to provide healthy techniques for coping with it.  Learning Barriers/Preferences:  Learning Barriers/Preferences - 08/24/24 1015       Learning Barriers/Preferences   Learning Barriers Sight   Double vision at times.   Learning Preferences Audio;Verbal Instruction;Video;Group Instruction;Individual Instruction;Pictoral;Skilled Demonstration          Education Topics:  Knowledge Questionnaire Score:  Knowledge Questionnaire Score - 08/24/24 0836       Knowledge Questionnaire Score   Pre Score 21/24          Core Components/Risk Factors/Patient Goals at Admission:  Personal Goals and Risk Factors at Admission - 08/24/24 0948       Core Components/Risk Factors/Patient Goals on Admission    Weight Management Yes;Obesity;Weight Loss    Intervention Weight Management/Obesity: Establish reasonable short term and long term weight goals.;Obesity: Provide education and appropriate resources to help participant work on and attain dietary goals.    Admit Weight 188 lb 15 oz (85.7 kg)    Expected Outcomes Weight Loss: Understanding of general recommendations  for a balanced deficit meal plan, which promotes 1-2 lb weight loss per week and includes a negative energy balance of [phone] kcal/d    Hypertension Yes    Intervention Provide education on lifestyle modifcations including regular physical activity/exercise, weight management, moderate sodium restriction and increased consumption of fresh fruit, vegetables, and low fat dairy, alcohol moderation, and smoking cessation.;Monitor prescription use compliance.    Expected Outcomes Short Term: Continued assessment and intervention until BP is < 140/47mm HG in hypertensive participants. < 130/61mm HG in hypertensive participants with diabetes, heart failure or chronic kidney disease.;Long Term: Maintenance of blood pressure at goal levels.    Lipids Yes    Intervention Provide education and support for participant on nutrition &  aerobic/resistive exercise along with prescribed medications to achieve LDL 70mg , HDL >40mg .    Expected Outcomes Short Term: Participant states understanding of desired cholesterol values and is compliant with medications prescribed. Participant is following exercise prescription and nutrition guidelines.;Long Term: Cholesterol controlled with medications as prescribed, with individualized exercise RX and with personalized nutrition plan. Value goals: LDL < 70mg , HDL > 40 mg.    Stress Yes    Intervention Offer individual and/or small group education and counseling on adjustment to heart disease, stress management and health-related lifestyle change. Teach and support self-help strategies.;Refer participants experiencing significant psychosocial distress to appropriate mental health specialists for further evaluation and treatment. When possible, include family members and significant others in education/counseling sessions.    Expected Outcomes Short Term: Participant demonstrates changes in health-related behavior, relaxation and other stress management skills, ability to obtain effective social support, and compliance with psychotropic medications if prescribed.;Long Term: Emotional wellbeing is indicated by absence of clinically significant psychosocial distress or social isolation.          Core Components/Risk Factors/Patient Goals Review:    Core Components/Risk Factors/Patient Goals at Discharge (Final Review):    ITP Comments:  ITP Comments     Row Name 08/24/24 0748           ITP Comments Medical Director- Dr. Wilbert Bihari, MD. Introduction to the Pritikin Education/ Intensive Cardiac Rehab Program.          Comments: Drako attended orientation for the cardiac rehabilitation program on  08/24/2024  to perform initial intake and exercise walk test. He was introduced to the [employer] education and orientation packet was reviewed. Completed 6-minute walk test, measurements,  initial ITP, and exercise prescription. Vital signs stable. Telemetry-normal sinus rhythm, asymptomatic.   Service time was from 746 to 921. Arnoldo CHRISTELLA Gal, MS, ACSM CEP 08/24/2024 1037     [1]  Current Outpatient Medications:    acetaminophen  (TYLENOL ) 325 MG tablet, Take 650 mg by mouth every 6 (six) hours as needed for moderate pain (pain score 4-6)., Disp: , Rfl:    amLODipine  (NORVASC ) 10 MG tablet, Take 1 tablet (10 mg total) by mouth daily., Disp: 180 tablet, Rfl: 3   aspirin  EC 81 MG tablet, Take 1 tablet (81 mg total) by mouth daily. Swallow whole., Disp: , Rfl:    atorvastatin  (LIPITOR) 80 MG tablet, Take 1 tablet (80 mg total) by mouth daily., Disp: 90 tablet, Rfl: 3   EPINEPHrine  0.3 mg/0.3 mL IJ SOAJ injection, Inject 0.3 mg into the muscle as needed for anaphylaxis., Disp: 2 each, Rfl: 1   isosorbide  mononitrate (IMDUR ) 30 MG 24 hr tablet, Take 1 tablet (30 mg total) by mouth daily., Disp: 90 tablet, Rfl: 3   isosorbide  mononitrate (IMDUR ) 60 MG  24 hr tablet, Take 1 tablet (60 mg total) by mouth daily., Disp: 90 tablet, Rfl: 3   levothyroxine  (SYNTHROID ) 50 MCG tablet, Take 1 tablet (50 mcg total) by mouth daily., Disp: 90 tablet, Rfl: 3   nitroGLYCERIN  (NITROSTAT ) 0.4 MG SL tablet, Place 1 tablet (0.4 mg total) under the tongue every 5 (five) minutes as needed for chest pain., Disp: 30 tablet, Rfl: 3   pyridostigmine (MESTINON) 60 MG tablet, Take 60mg  three times daily for 1 month (Patient not taking: Reported on 08/24/2024), Disp: , Rfl:  [2]  Social History Tobacco Use  Smoking Status Former   Current packs/day: 0.00   Types: Cigarettes   Quit date: 08/26/1982   Years since quitting: 42.0  Smokeless Tobacco Never

## 2024-08-30 ENCOUNTER — Encounter (HOSPITAL_COMMUNITY)
Admission: RE | Admit: 2024-08-30 | Discharge: 2024-08-30 | Disposition: A | Discharge status: Home / Self Care | Source: Ambulatory Visit | Attending: Cardiology | Admitting: Cardiology

## 2024-08-30 DIAGNOSIS — I2089 Other forms of angina pectoris: Secondary | ICD-10-CM | POA: Diagnosis present

## 2024-08-30 DIAGNOSIS — R079 Chest pain, unspecified: Secondary | ICD-10-CM | POA: Diagnosis present

## 2024-08-30 NOTE — Progress Notes (Signed)
 Daily Session Note  Patient Details  Name: Roger [name] MRN: [id number] Date of Birth: July 26, 1956 Referring Provider:   Flowsheet Row INTENSIVE CARDIAC REHAB ORIENT from 08/24/2024 in [facility]  Referring Provider Anner Alm ORN, MD    Encounter Date: 08/30/2024  Check In:  Session Check In - 08/30/24 9081       Check-In   Supervising physician immediately available to respond to emergencies [facility] MD immediately available    Physician(s) Rosabel Mose, NP    Location [facility]    Staff Present Hadassah Quan, RN, Avonne Gal, MS, ACSM-CEP, Exercise Physiologist;Jetta Vannie BS, ACSM-CEP, Exercise Physiologist;Joseph Lennon, RN, Mallory Parkins, MS, ACSM-CEP, CCRP, Exercise Physiologist    Virtual Visit No    Medication changes reported     No    Fall or balance concerns reported    No    Tobacco Cessation No Change    Warm-up and Cool-down Performed as group-led instruction    Resistance Training Performed Yes    VAD Patient? No    PAD/SET Patient? No      Pain Assessment   Currently in Pain? No/denies    Pain Score 0-No pain    Multiple Pain Sites No          Capillary Blood Glucose: No results found for this or any previous visit (from the past 24 hours).   Exercise Prescription Changes - 08/30/24 1500       Response to Exercise   Blood Pressure (Admit) 142/72    Blood Pressure (Exercise) 158/78    Blood Pressure (Exit) 126/70    Heart Rate (Admit) 88 bpm    Heart Rate (Exercise) 120 bpm    Heart Rate (Exit) 91 bpm    Rating of Perceived Exertion (Exercise) 9    Symptoms None    Comments Pt's first day in the CRP2 program    Duration Continue with 30 min of aerobic exercise without signs/symptoms of physical distress.    Intensity THRR unchanged      Progression   Progression Continue to progress workloads to maintain intensity without signs/symptoms of physical  distress.    Average METs 2.45      Resistance Training   Weight 2 lbs    Reps 10-15    Time 5 Minutes      Interval Training   Interval Training No      Recumbant Bike   Level 1    RPM 674    Watts 18    Minutes 15    METs 2.2      NuStep   Level 2    SPM 83    Minutes 15    METs 2.7          Tobacco Use History[1]  Goals Met:  Exercise tolerated well No report of concerns or symptoms today Strength training completed today  Goals Unmet:  Not Applicable  Comments: Pt started cardiac rehab today.  Pt tolerated light exercise without difficulty. VSS, telemetry-Sinus Rhythm, asymptomatic.  Medication list reconciled. Pt denies barriers to medicaiton compliance.  PSYCHOSOCIAL ASSESSMENT:  PHQ-0. Pt exhibits positive coping skills, hopeful outlook with supportive family. No psychosocial needs identified at this time, no psychosocial interventions necessary.    Pt oriented to exercise equipment and routine.    Understanding verbalized. Hadassah Elpidio Quan RN BSN    Dr. Wilbert Bihari is Medical Director for Cardiac Rehab at [facility].     [  1]  Social History Tobacco Use  Smoking Status Former   Current packs/day: 0.00   Types: Cigarettes   Quit date: 08/26/1982   Years since quitting: 42.0  Smokeless Tobacco Never

## 2024-09-01 ENCOUNTER — Encounter (HOSPITAL_COMMUNITY)
Admission: RE | Admit: 2024-09-01 | Discharge: 2024-09-01 | Disposition: A | Discharge status: Home / Self Care | Source: Ambulatory Visit | Attending: Cardiology | Admitting: Cardiology

## 2024-09-01 DIAGNOSIS — I2089 Other forms of angina pectoris: Secondary | ICD-10-CM | POA: Diagnosis not present

## 2024-09-03 ENCOUNTER — Encounter (HOSPITAL_COMMUNITY)
Admission: RE | Admit: 2024-09-03 | Discharge: 2024-09-03 | Disposition: A | Source: Ambulatory Visit | Attending: Cardiology

## 2024-09-03 ENCOUNTER — Telehealth (HOSPITAL_COMMUNITY): Payer: Self-pay | Admitting: *Deleted

## 2024-09-03 DIAGNOSIS — R079 Chest pain, unspecified: Secondary | ICD-10-CM | POA: Diagnosis not present

## 2024-09-03 DIAGNOSIS — I2089 Other forms of angina pectoris: Secondary | ICD-10-CM

## 2024-09-03 NOTE — Telephone Encounter (Signed)
 Left message to notify of appointment with Dr Anner on 09/16/23 at 1:20 pm will cancel appointment until that time.Hadassah Elpidio Quan RN BSN

## 2024-09-03 NOTE — Telephone Encounter (Signed)
 Left message to call cardiac rehab.Hadassah Elpidio Quan RN BSN

## 2024-09-03 NOTE — Progress Notes (Addendum)
 Marvin reports he has noticed a brief feeling of burning in his chest since he has been on the recumbent bike that resolves quickly. Denies chest pain or any other symptoms. Javien says he has experienced this sensation twice it has gone away quickly. Entry blood pressure 122/78 heart rate 109 on the recumbent bike BP with a BP of 160/80 telemetry rhythm Sinus Rhythm, Sinus tach. Will notify onsite provider Damien Braver NP and continue to monitor the patient. Damien Braver reviewed today's ECG tracings okay to proceed with exercise and monitor. Dillyn reported having more chest burning on the nustep 13 minutes into exercise at 0945. Exercise stopped. Onsite provider Damien Braver NP notified about persistent chest burning. Leith rated the burning a 4/10 that lasted 2-3 minutes. Denied having chest pain. 12 lead ECG  ordered obtained and reviewed by Damien Braver NP. No ECG changes noted per Damien Braver NP. Will forward today's event to Dr Anner for review and get guidance about proceeding with exercise. Wilmer had no further complaints or symptoms upon exit from cardiac rehab. Exit BP 122/70 heart rate 77. Oxygen saturation 97% on room air. ED precautions reviewed. Medications reviewed taking as prescribed. Elpidio Quan RN BSN

## 2024-09-06 ENCOUNTER — Encounter (HOSPITAL_COMMUNITY)

## 2024-09-08 ENCOUNTER — Encounter (HOSPITAL_COMMUNITY)

## 2024-09-10 ENCOUNTER — Encounter (HOSPITAL_COMMUNITY)

## 2024-09-13 ENCOUNTER — Encounter (HOSPITAL_COMMUNITY)

## 2024-09-15 ENCOUNTER — Ambulatory Visit: Attending: Cardiology | Admitting: Cardiology

## 2024-09-15 ENCOUNTER — Encounter (HOSPITAL_COMMUNITY)

## 2024-09-15 ENCOUNTER — Encounter (HOSPITAL_COMMUNITY): Payer: Self-pay | Admitting: *Deleted

## 2024-09-15 ENCOUNTER — Encounter: Payer: Self-pay | Admitting: Cardiology

## 2024-09-15 VITALS — BP 126/74 | HR 97 | Ht 63.0 in | Wt 186.8 lb

## 2024-09-15 DIAGNOSIS — I25119 Atherosclerotic heart disease of native coronary artery with unspecified angina pectoris: Secondary | ICD-10-CM

## 2024-09-15 DIAGNOSIS — Z0000 Encounter for general adult medical examination without abnormal findings: Secondary | ICD-10-CM | POA: Diagnosis not present

## 2024-09-15 DIAGNOSIS — E785 Hyperlipidemia, unspecified: Secondary | ICD-10-CM

## 2024-09-15 DIAGNOSIS — I340 Nonrheumatic mitral (valve) insufficiency: Secondary | ICD-10-CM

## 2024-09-15 DIAGNOSIS — I2089 Other forms of angina pectoris: Secondary | ICD-10-CM

## 2024-09-15 DIAGNOSIS — R079 Chest pain, unspecified: Secondary | ICD-10-CM | POA: Diagnosis not present

## 2024-09-15 DIAGNOSIS — I10 Essential (primary) hypertension: Secondary | ICD-10-CM

## 2024-09-15 MED ORDER — AMLODIPINE BESYLATE 10 MG PO TABS: 10.0000 mg | ORAL_TABLET | Freq: Every morning | ORAL | Status: AC

## 2024-09-15 MED ORDER — METOPROLOL SUCCINATE ER 25 MG PO TB24: 25.0000 mg | ORAL_TABLET | Freq: Every day | ORAL | 3 refills | Status: AC

## 2024-09-15 NOTE — Patient Instructions (Addendum)
 Medication Instructions:   Start taking  Metoprolol  succinate 25 mg at bedtime  Start taking  Amlodipine   10 mg in the morning   *If you need a refill on your cardiac medications before your next appointment, please call your pharmacy*   Lab Work:  Not  needed  Testing/Procedures: Not needed   Follow-Up: At [facility], you and your health needs are our priority.  As part of our continuing mission to provide you with exceptional heart care, we have created designated Provider Care Teams.  These Care Teams include your primary Cardiologist (physician) and Advanced Practice Providers (APPs -  Physician Assistants and Nurse Practitioners) who all work together to provide you with the care you need, when you need it.     Your next appointment:   2 month(s)  The format for your next appointment:   In Person  Provider:   Lum Louis, NP      Then, Alm Clay, MD will plan to see you again in 5 to  6 month(s).   Other Instructions    You may resume cardiac rehab

## 2024-09-15 NOTE — Progress Notes (Unsigned)
 " Cardiology Office Note:  .   Date:  09/16/2024  ID:  Roger [name], DOB 1955/10/05, MRN [id number] PCP: Roger Baller, MD  [facility] Providers Cardiologist:  Alm Clay, MD     Chief Complaint  Patient presents with   Follow-up    Episode of chest pain during cardiac rehab   Coronary Artery Disease    Occasional exertional chest pain but otherwise doing fairly well.    Patient Profile: .     Roger [name] is a mildly obese 69 y.o. male non-smoker with a PMH notable for CAD-LCx PCI (LCx stent along with preceding OM 2 CTO, severe D1-D2 disease with moderate to severe LAD disease and severe PDA/PL disease) who presents here for work in visit to discuss chest pain.   Usually follows up at the request of Roger Baller, MD.  Roger [name] is a mildly obese 69 y.o. male non-smoker with a PMH notable for CAD-LCx PCI 2-4 with HTN HLD who presents here for 1 month follow-up.   CAD (cath for progressive angina-2004) severe LCx stenosis-DES PCI with Cordis 2.5 x 18 mm stent; was also noted to have additional 40 to 50% stenoses elsewhere.   Roger [name], [place] - [facility]) Lost to cardiology follow-up since 2012 Stable Angina: Cardiac Cath 01/13/2024: 70% proximal LAD at D1 (RFR negative), ostial D1 and D2 90%, mid LAD after D2 50%; OM1 and mid LCx prior to OM 2/OM 3 CTO, 40% proximal RCA, 90% proximal PDA with diffuse severe disease in RPL 1 and RPL 2  => recommended medical management Hyperlipidemia Hypertension   Roger [name] is an mildly obese 69 y.o. male non-smoker with a PMH notable for CAD-PCI LCx in 2004 hyperlipidemia who was seen for initial consult on Jan 12, 2024 with complaints of Exertional Dyspnea/Cardiac Clearance for Colonoscopy at the request of Roger Baller, MD.  He complained of symptoms that were somewhat concerning for progressive angina described as exertional dyspnea and chest tightness that occurred grossly with physical exertion such as walking.  He  had 1 significant episode that caused him to go home from work feeling faint hot and sweaty.  Noted high blood pressures at that time. => Cardiac cath 01/14/2024 by Dr. Jordan (reviewed below) : CTO LCx-OM2 stent as well as an occluded inferior branch of OM1.  RFR borderline lesion in proximal LAD with diffuse disease throughout the LAD.  Tandem 90% lesions in the PDA as well as PL as well as lesions in both diagonal branches not favorable for PCI.  Could consider CABG if symptoms persist  He was seen for post-cath follow-up by Jackee Alberts, NP on increased dose of Imdur  60 mg daily, amlodipine  5 mg daily and statin.  He still describes some sharp left-sided rib cage pain with walking.  Usually relieved with 2 doses of nitroglycerin .  Symptoms are still reproducible with exercise.  Felt more tired but no dizziness. => Suggested cardiac rehab.  I saw Roger [name] on June 07, 2024: He continued to note intermittent episodes of right-sided chest pain that was sharp in nature and not associate with physical activity.  Not lasting that long.  No associated tachycardia, dizziness or dyspnea.  No exertional chest discomfort dyspnea during routine exercises such as walking during his recent cruise.  He joined Geophysicist/field seismologist.  Overall improved exercise tolerance with less fatigue, palpitations and exertional dyspnea.  Chest pain not necessarily exertional.=> Recommended Silver sneakers with gradual exercise increase versus cardiac rehab referral.  He was on amlodipine  10 mg  daily, Imdur  90 really daily, aspirin  81 mg daily and atorvastatin  80 mg daily.  LDL notably improved on higher dose of statin was 43 down from 65.  On reviewing his Films, unlikely that he would be a viable CABG candidate based on small vessels and extensive disease that would not Nestl be to be grafted.  Medical management is his best option. => He had a sleep study done on November 25-confirmed severe obstructive OSA with AHI of 31.1/hour  and no central sleep apnea.  Oxygen desaturation as low as 81%.  Moderate snoring.  Noted the patient is intolerant of PAP therapy with plan to refer to ENT.  He was cleared for cardiac rehab and started on December 30.  He was at his fourth day on January 9 and had chest pain..    Subjective  Discussed the use of AI scribe software for clinical note transcription with the patient, who gave verbal consent to proceed.  History of Present Illness Kashus returns here today as a work in patient due to chest pain while at cardiac rehab.  He is companied by his wife.  He notes that he had an episode during cardiac rehab where he experienced a burning sensation in his chest while exercising on a bike. This episode prompted an EKG at the rehab facility with no acute changes.  This was the first occurrence of such pain in a long time, lasting about two minutes. He has not experienced similar pain since, except for a minor 'nudge' of pain while sitting in the car, which resolved quickly.  Since October, he recalls two episodes of chest pain, one of which required nitroglycerin , which resolved the symptoms. He is currently on amlodipine  10 mg, aspirin  81 mg, Exforge, Imdur , and atorvastatin  80 mg.  He recently started cardiac rehabilitation in January and experienced the chest pain episode during his first week. He is able to walk for 20 minutes without chest pain and feels great during these walks. No symptoms of heart failure, such as shortness of breath when lying flat or waking up short of breath.  His cholesterol levels were checked in October and were reported as outstanding. He is not diagnosed with diabetes and takes Synthroid  50 mcg. 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   Cardiovascular ROS: positive for - brief episode lasting 3 to 5 minutes of chest pain that occurred with moderate to significant exertion on exercise bike trying to get his heart rate up  into the 115 to 120 bpm range.  Symptoms resolved with rest.  Expected exertional dyspnea but nothing more. negative for - edema, irregular heartbeat, orthopnea, palpitations, paroxysmal nocturnal dyspnea, rapid heart rate, shortness of breath, or lightheadedness, dizziness or wooziness, syncope or near syncope, TIA or emesis PCOS, claudication     Objective   Active Medications[1]  Meds: Amlodipine  10 mL daily, aspirin  81 daily daily, atorvastatin  80 mg daily, Imdur  90 mg (30+60) daily; Synthroid  50 mg daily  Studies Reviewed: SABRA   EKG Interpretation Date/Time:  Wednesday September 15 2024 13:22:06 EST Ventricular Rate:  85 PR Interval:  164 QRS Duration:  84 QT Interval:  348 QTC Calculation: 414 R Axis:   52  Text Interpretation: Normal sinus rhythm Septal infarct , age undetermined T wave abnormality, consider inferior ischemia When compared with ECG of 03-Sep-2024 09:58, T wave inversion now evident in Inferior leads Confirmed by Anner Lenis ([id number]) on 09/15/2024 1:32:01 PM    Lab  Results  Component Value Date   CHOL 96 06/03/2024   HDL 31.90 (L) 06/03/2024   LDLCALC 43 06/03/2024   LDLDIRECT 106.0 08/16/2019   TRIG 110.0 06/03/2024   CHOLHDL 3 06/03/2024   Lab Results  Component Value Date   NA 142 05/27/2024   K 3.9 05/27/2024   CREATININE 0.98 05/27/2024   GFR 78.47 05/27/2024   GLUCOSE 107 (H) 05/27/2024   Lab Results  Component Value Date   HGBA1C 5.6 09/27/2019   Results Labs Chol (05/2024): Outstanding control  Diagnostic Echocardiogram: Normal pump function, EF 55-60%-no RWMA, mild mitral regurgitation, mild left atrial dilation, calcific aortic sclerosis but no stenosis; normal atrial pressures.  (01/05/2024) Cardiac Catheterization: Stent in circumflex closed, OM2 closed, narrowing in LAD branches, significant narrowing in right coronary artery branches, no suitable targets for stenting (01/14/2024) Dominance: Right: Proximal RCA 40%, RPDA 90% tandem  lesions and RPL1 90%.  Proximal LAD 70%, mid LAD 50% (borderline RFR 0.9..  Ostial proximal D1 90% and ostial proximal mid D2 90%.  OM2 stent closed with occlusion of the mid to distal LCx.  Mildly elevated LVEDP.SABRA  On my review of the images, the OM1 bifurcates and the inferior branch is occluded filling via collaterals, the LCx gives off the AV groove and then OM2 with a stent going from LCx into larger branch of OM 2 that is occluded with TIMI I flow in both branches of the OM 2.  The LAD is a relatively small caliber vessel with diffuse disease.  The proximal lesion that has borderline FFR would be treatable, but there are still multiple other lesions to consider as potential culprits.  I agree with Dr. Gib assessment that there is diffuse segmental small vessel disease.  Perhaps the only viable target would be in the ostial PDA but there is another lesion just beyond it.  Risk Assessment/Calculations:         STOP-Bang Score:  7        Physical Exam:   VS:  BP 126/74 (BP Location: Left Arm, Patient Position: Sitting, Cuff Size: Normal)   Pulse 97   Ht 5' 3 (1.6 m)   Wt 186 lb 12.8 oz (84.7 kg)   SpO2 99%   BMI 33.09 kg/m    Wt Readings from Last 3 Encounters:  09/15/24 186 lb 12.8 oz (84.7 kg)  08/24/24 188 lb 15 oz (85.7 kg)  06/07/24 187 lb 6.4 oz (85 kg)     GEN: Well nourished, well developed in no acute distress; mildly obese NECK: No JVD; No carotid bruits CARDIAC: Normal S1, S2; RRR, 1/6 SEM at RUSB and otherwise no murmurs, rubs, gallops RESPIRATORY:  Clear to auscultation without rales, wheezing or rhonchi ; nonlabored, good air movement. ABDOMEN: Soft, non-tender, non-distended EXTREMITIES:  No edema; No deformity      ASSESSMENT AND PLAN: .   Coronary artery disease involving native coronary artery of native heart with angina pectoris Chronic coronary artery disease with stable angina Significant plaque buildup in multiple coronary arteries.  Recent  exertional angina episode during rehab, no further episodes.  No symptoms at rest or with routine activity.  Discussed potential interventions if symptoms worsen, current focus on medical management. Considered beta-blocker therapy to lower resting heart rate and reduce exertional angina. Discussed stenting or bypass if symptoms worsen, current plan is medication management and monitoring response to rehab. - Added metoprolol  25 mg at night. - Continue amlodipine  10 mg in the morning along with Imdur  90 mg  daily => next plan will be to titrate up amlodipine  if pressures tolerate and symptoms persist versus considering Ranexa.. - Continue 81 mg aspirin  and 80 mg atorvastatin  - Continue cardiac rehab with caution, avoiding excessive exertion. - Schedule follow-up with PA in two months or with cardiologist in four to five months.  Mild mitral regurgitation by prior echocardiography Continue BP control and add beta-blocker for rate control and antianginal benefit.  I talked about adding ARB but with increased resting heart rate and angina symptoms, would prefer beta-blocker to ARB.  ARB would be next.  Essential hypertension Stable blood pressure today.  Unlikely that Toprol  will affect blood pressure too much but monitor for hypotension.  Continue amlodipine  10 mg daily along with Imdur  90 mL daily and add low-dose Toprol -XL 25 mg  Hyperlipidemia with target low density lipoprotein (LDL) cholesterol less than 55 mg/dL Labs as of October outstanding with an LDL 43 on 80 mg atorvastatin .   Continue current dose of statin for now.  Health maintenance examination Evaluation for reinitiation of cardiac rehab. 1 episode of exertional chest discomfort in a patient with known CAD is not expected.  Recommendation will be to avoid pushing to the point of heart rate > 110 bpm.  He is on stable antianginal regiment.  Purpose of cardiac rehab is to increase his exercise tolerance by exercising up to but not  beyond his angina threshold.  This would help with guidance going forward after [facility] graduation  Chest pain in adult He is having several different symptoms and the sharp pain is probably not anginal but the episode while working on the exercise bike very likely could be angina.  He has stable angina by definition with his anatomy.  Would probably not be beneficial to order a stress test simply because of his diffuse disease.  For fleeting episodes of pain would simply treat medically for now.  Adding low-dose beta-blocker.   Orders Placed This Encounter  Procedures   EKG 12-Lead    Hypothyroidism Managed with Synthroid  50 mcg daily. - Continue Synthroid  50 mcg daily.        Follow-Up: Return in about 1 month (around 10/16/2024) for Follow-up with APP: To reassess chest discomfort improvement with Toprol  ., .  I spent 43 minutes in the care of Amari Richman today including reviewing labs (1 minute), reviewing studies (I reviewed his cath films and report-4 minutes), face to face time discussing treatment options (21), reviewing records from previous visit including APP note-and My-chart notes (6 minutes), 11 minutes dictating, and documenting in the encounter.   Portions of this note were dictated using DRAGON voice recognition software. Please disregard any errors in transcription. This record has been created using Conservation officer, historic buildings. Errors have been sought and corrected, but may not always be located. Such creation errors do not reflect on the standard of medical care.      Signed, Alm MICAEL Clay, MD, MS Alm Clay, M.D., M.S. Interventional Cardiologist  [facility] Pager # [phone]          [1]  Current Meds  Medication Sig   acetaminophen  (TYLENOL ) 325 MG tablet Take 650 mg by mouth every 6 (six) hours as needed for moderate pain (pain score 4-6).   aspirin  EC 81 MG tablet Take 1 tablet (81 mg total) by mouth daily. Swallow whole.    atorvastatin  (LIPITOR) 80 MG tablet Take 1 tablet (80 mg total) by mouth daily.   EPINEPHrine  0.3 mg/0.3 mL IJ SOAJ injection  Inject 0.3 mg into the muscle as needed for anaphylaxis.   isosorbide  mononitrate (IMDUR ) 30 MG 24 hr tablet Take 1 tablet (30 mg total) by mouth daily.   isosorbide  mononitrate (IMDUR ) 60 MG 24 hr tablet Take 1 tablet (60 mg total) by mouth daily.   levothyroxine  (SYNTHROID ) 50 MCG tablet Take 1 tablet (50 mcg total) by mouth daily.   metoprolol  succinate (TOPROL  XL) 25 MG 24 hr tablet Take 1 tablet (25 mg total) by mouth at bedtime.   nitroGLYCERIN  (NITROSTAT ) 0.4 MG SL tablet Place 1 tablet (0.4 mg total) under the tongue every 5 (five) minutes as needed for chest pain.   [DISCONTINUED] amLODipine  (NORVASC ) 10 MG tablet Take 1 tablet (10 mg total) by mouth daily.   "

## 2024-09-15 NOTE — Progress Notes (Signed)
 Patient has been cleared to exercise at cardiac rehab per Dr Anner. Hadassah Elpidio Quan RN BSN

## 2024-09-16 ENCOUNTER — Encounter: Payer: Self-pay | Admitting: Cardiology

## 2024-09-16 NOTE — Assessment & Plan Note (Signed)
 Stable blood pressure today.  Unlikely that Toprol  will affect blood pressure too much but monitor for hypotension.  Continue amlodipine  10 mg daily along with Imdur  90 mL daily and add low-dose Toprol -XL 25 mg

## 2024-09-16 NOTE — Assessment & Plan Note (Signed)
 Evaluation for reinitiation of cardiac rehab. 1 episode of exertional chest discomfort in a patient with known CAD is not expected.  Recommendation will be to avoid pushing to the point of heart rate > 110 bpm.  He is on stable antianginal regiment.  Purpose of cardiac rehab is to increase his exercise tolerance by exercising up to but not beyond his angina threshold.  This would help with guidance going forward after [facility] graduation

## 2024-09-16 NOTE — Assessment & Plan Note (Signed)
 Continue BP control and add beta-blocker for rate control and antianginal benefit.  I talked about adding ARB but with increased resting heart rate and angina symptoms, would prefer beta-blocker to ARB.  ARB would be next.

## 2024-09-16 NOTE — Progress Notes (Signed)
 Roger [name]                                          MRN: [id number]   09/16/2024   The [facility]I Quality Team Specialist reviewed this patient medical record for the purposes of chart review for care gap closure. The following were reviewed: chart review for care gap closure-controlling blood pressure.    VBCI Quality Team

## 2024-09-16 NOTE — Assessment & Plan Note (Signed)
 He is having several different symptoms and the sharp pain is probably not anginal but the episode while working on the exercise bike very likely could be angina.  He has stable angina by definition with his anatomy.  Would probably not be beneficial to order a stress test simply because of his diffuse disease.  For fleeting episodes of pain would simply treat medically for now.  Adding low-dose beta-blocker.

## 2024-09-16 NOTE — Assessment & Plan Note (Signed)
 Labs as of October outstanding with an LDL 43 on 80 mg atorvastatin .   Continue current dose of statin for now.

## 2024-09-16 NOTE — Assessment & Plan Note (Signed)
 Chronic coronary artery disease with stable angina Significant plaque buildup in multiple coronary arteries.  Recent exertional angina episode during rehab, no further episodes.  No symptoms at rest or with routine activity.  Discussed potential interventions if symptoms worsen, current focus on medical management. Considered beta-blocker therapy to lower resting heart rate and reduce exertional angina. Discussed stenting or bypass if symptoms worsen, current plan is medication management and monitoring response to rehab. - Added metoprolol  25 mg at night. - Continue amlodipine  10 mg in the morning along with Imdur  90 mg daily => next plan will be to titrate up amlodipine  if pressures tolerate and symptoms persist versus considering Ranexa.. - Continue 81 mg aspirin  and 80 mg atorvastatin  - Continue cardiac rehab with caution, avoiding excessive exertion. - Schedule follow-up with PA in two months or with cardiologist in four to five months.

## 2024-09-17 ENCOUNTER — Encounter (HOSPITAL_COMMUNITY): Admission: RE | Admit: 2024-09-17 | Source: Ambulatory Visit

## 2024-09-20 ENCOUNTER — Encounter (HOSPITAL_COMMUNITY)

## 2024-09-21 ENCOUNTER — Encounter (HOSPITAL_COMMUNITY): Payer: Self-pay | Admitting: *Deleted

## 2024-09-21 ENCOUNTER — Telehealth (HOSPITAL_COMMUNITY): Payer: Self-pay | Admitting: *Deleted

## 2024-09-21 DIAGNOSIS — I2089 Other forms of angina pectoris: Secondary | ICD-10-CM

## 2024-09-21 NOTE — Telephone Encounter (Signed)
 Left message to call cardiac rehab. Calling to inquire about when the patient plans to return to exercise.Hadassah Elpidio Quan RN BSN

## 2024-09-21 NOTE — Progress Notes (Signed)
 Cardiac Individual Treatment Plan  Patient Details  Name: Roger [name] MRN: [id number] Date of Birth: 1955-11-30 Referring Provider:   Flowsheet Row INTENSIVE CARDIAC REHAB ORIENT from 08/24/2024 in [facility]  Referring Provider Anner Alm ORN, MD    Initial Encounter Date:  Flowsheet Row INTENSIVE CARDIAC REHAB ORIENT from 08/24/2024 in [facility]  Date 08/24/24    Visit Diagnosis: Stable angina  Patient's Home Medications on Admission: Current Medications[1]  Past Medical History: Past Medical History:  Diagnosis Date   Bell's palsy x2   recurrent   CAD (coronary artery disease) 2004   2004: PCI to LCx; May 2025: CTO of LCx at stent with occlusion of branch of OM 2, severe PDA and PL disease as well as moderate proximal and mid LAD disease with severe D1 and D2 disease.  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    History of chicken pox    History of hypothyroidism as a child   HLD (hyperlipidemia)    Thyroid  disease     Tobacco Use: Tobacco Use History[2]  Labs: Review Flowsheet  More data exists      Latest Ref Rng & Units 02/22/2020 05/23/2023 12/01/2023 04/12/2024 06/03/2024  Labs for ITP Cardiac and Pulmonary Rehab  Cholestrol 0 - 200 mg/dL 833  813  875  864  96   LDL (calc) 0 - 99 mg/dL 895  887  65  65  43   HDL-C >39.00 mg/dL 65.69  60.59  64.69  34  31.90   Trlycerides 0.0 - 149.0 mg/dL 861.9  826.9  878.9  781  110.0     Capillary Blood Glucose: Lab Results  Component Value Date   GLUCAP 87 03/05/2024     Exercise Target Goals: Exercise Program Goal: Individual exercise prescription set using results from initial 6 min walk test and THRR while considering  patients activity barriers and safety.   Exercise  Prescription Goal: Initial exercise prescription builds to 30-45 minutes a day of aerobic activity, 2-3 days per week.  Home exercise guidelines will be given to patient during program as part of exercise prescription that the participant will acknowledge.  Activity Barriers & Risk Stratification:  Activity Barriers & Cardiac Risk Stratification - 08/24/24 0904       Activity Barriers & Cardiac Risk Stratification   Activity Barriers Balance Concerns;Other (comment)    Comments Double vision    Cardiac Risk Stratification High          6 Minute Walk:  6 Minute Walk     Row Name 08/24/24 0908         6 Minute Walk   Phase Initial     Distance 1461 feet     Walk Time 6 minutes     # of Rest Breaks 0     MPH 2.77     METS 3.13     RPE 11     Perceived Dyspnea  0     VO2 Peak 10.95     Symptoms No     Resting HR 75 bpm     Resting BP 114/70     Resting Oxygen Saturation  96 %     Exercise Oxygen Saturation  during  6 min walk 96 %     Max Ex. HR 104 bpm     Max Ex. BP 158/80     2 Minute Post BP 156/80        Oxygen Initial Assessment:   Oxygen Re-Evaluation:   Oxygen Discharge (Final Oxygen Re-Evaluation):   Initial Exercise Prescription:  Initial Exercise Prescription - 08/24/24 0900       Date of Initial Exercise RX and Referring Provider   Date 08/24/24    Referring Provider Anner Alm ORN, MD    Expected Discharge Date 11/17/24      Recumbant Bike   Level 1    RPM 30    Watts 25    Minutes 15    METs 2.5      NuStep   Level 2    SPM 85    Minutes 15    METs 2.5      Prescription Details   Frequency (times per week) 3    Duration Progress to 30 minutes of continuous aerobic without signs/symptoms of physical distress      Intensity   THRR 40-80% of Max Heartrate 60-121    Ratings of Perceived Exertion 11-13    Perceived Dyspnea 0-4      Progression   Progression Continue to progress workloads to maintain intensity without  signs/symptoms of physical distress.      Resistance Training   Training Prescription Yes    Weight 2 lbs    Reps 10-15          Perform Capillary Blood Glucose checks as needed.  Exercise Prescription Changes:   Exercise Prescription Changes     Row Name 08/30/24 1500             Response to Exercise   Blood Pressure (Admit) 142/72       Blood Pressure (Exercise) 158/78       Blood Pressure (Exit) 126/70       Heart Rate (Admit) 88 bpm       Heart Rate (Exercise) 120 bpm       Heart Rate (Exit) 91 bpm       Rating of Perceived Exertion (Exercise) 9       Symptoms None       Comments Pt's first day in the CRP2 program       Duration Continue with 30 min of aerobic exercise without signs/symptoms of physical distress.       Intensity THRR unchanged         Progression   Progression Continue to progress workloads to maintain intensity without signs/symptoms of physical distress.       Average METs 2.45         Resistance Training   Weight 2 lbs       Reps 10-15       Time 5 Minutes         Interval Training   Interval Training No         Recumbant Bike   Level 1       RPM 674       Watts 18       Minutes 15       METs 2.2         NuStep   Level 2       SPM 83       Minutes 15       METs 2.7          Exercise Comments:  Exercise Comments     Row Name 08/30/24 1544 09/13/24 0843         Exercise Comments Pt exercised without complaints today on his initial session. Pt tolerated session well. Pt ids due for METs review but is still on medical hold. Will complete upon patient return.         Exercise Goals and Review:   Exercise Goals     Row Name 08/24/24 0904             Exercise Goals   Increase Physical Activity Yes       Intervention Provide advice, education, support and counseling about physical activity/exercise needs.;Develop an individualized exercise prescription for aerobic and resistive training based on initial evaluation  findings, risk stratification, comorbidities and participant's personal goals.       Expected Outcomes Short Term: Attend rehab on a regular basis to increase amount of physical activity.;Long Term: Add in home exercise to make exercise part of routine and to increase amount of physical activity.;Long Term: Exercising regularly at least 3-5 days a week.       Increase Strength and Stamina Yes       Intervention Provide advice, education, support and counseling about physical activity/exercise needs.;Develop an individualized exercise prescription for aerobic and resistive training based on initial evaluation findings, risk stratification, comorbidities and participant's personal goals.       Expected Outcomes Short Term: Increase workloads from initial exercise prescription for resistance, speed, and METs.;Short Term: Perform resistance training exercises routinely during rehab and add in resistance training at home;Long Term: Improve cardiorespiratory fitness, muscular endurance and strength as measured by increased METs and functional capacity ( )       Able to understand and use rate of perceived exertion (RPE) scale Yes       Intervention Provide education and explanation on how to use RPE scale       Expected Outcomes Short Term: Able to use RPE daily in rehab to express subjective intensity level;Long Term:  Able to use RPE to guide intensity level when exercising independently       Knowledge and understanding of Target Heart Rate Range (THRR) Yes       Intervention Provide education and explanation of THRR including how the numbers were predicted and where they are located for reference       Expected Outcomes Short Term: Able to state/look up THRR;Long Term: Able to use THRR to govern intensity when exercising independently;Short Term: Able to use daily as guideline for intensity in rehab       Able to check pulse independently Yes       Intervention Provide education and demonstration on how  to check pulse in carotid and radial arteries.;Review the importance of being able to check your own pulse for safety during independent exercise       Expected Outcomes Short Term: Able to explain why pulse checking is important during independent exercise;Long Term: Able to check pulse independently and accurately       Understanding of Exercise Prescription Yes       Intervention Provide education, explanation, and written materials on patient's individual exercise prescription       Expected Outcomes Short Term: Able to explain program exercise prescription;Long Term: Able to explain home exercise prescription to exercise independently          Exercise Goals Re-Evaluation :  Exercise Goals Re-Evaluation     Row Name 08/30/24 [phone]  Exercise Goal Re-Evaluation   Exercise Goals Review Increase Physical Activity;Understanding of Exercise Prescription;Increase Strength and Stamina;Knowledge and understanding of Target Heart Rate Range (THRR);Able to understand and use rate of perceived exertion (RPE) scale       Comments Pt's first day in the CRP2 program. Pt understands the exercise Rx, RPE sclae and THRR.       Expected Outcomes Will continue to monitor the patient and progress exercise workloads as tolerated.          Discharge Exercise Prescription (Final Exercise Prescription Changes):  Exercise Prescription Changes - 08/30/24 1500       Response to Exercise   Blood Pressure (Admit) 142/72    Blood Pressure (Exercise) 158/78    Blood Pressure (Exit) 126/70    Heart Rate (Admit) 88 bpm    Heart Rate (Exercise) 120 bpm    Heart Rate (Exit) 91 bpm    Rating of Perceived Exertion (Exercise) 9    Symptoms None    Comments Pt's first day in the CRP2 program    Duration Continue with 30 min of aerobic exercise without signs/symptoms of physical distress.    Intensity THRR unchanged      Progression   Progression Continue to progress workloads to maintain intensity  without signs/symptoms of physical distress.    Average METs 2.45      Resistance Training   Weight 2 lbs    Reps 10-15    Time 5 Minutes      Interval Training   Interval Training No      Recumbant Bike   Level 1    RPM 674    Watts 18    Minutes 15    METs 2.2      NuStep   Level 2    SPM 83    Minutes 15    METs 2.7          Nutrition:  Target Goals: Understanding of nutrition guidelines, daily intake of sodium 1500mg , cholesterol 200mg , calories 30% from fat and 7% or less from saturated fats, daily to have 5 or more servings of fruits and vegetables.  Biometrics:  Pre Biometrics - 08/24/24 0748       Pre Biometrics   Waist Circumference 45.75 inches    Hip Circumference 41 inches    Waist to Hip Ratio 1.12 %    Triceps Skinfold 20 mm    % Body Fat 33.7 %    Grip Strength 6 kg    Flexibility 9.75 in    Single Leg Stand 0.52 seconds           Nutrition Therapy Plan and Nutrition Goals:  Nutrition Therapy & Goals - 08/30/24 0954       Nutrition Therapy   Diet Heart Healthy    Drug/Food Interactions Statins/Certain Fruits      Personal Nutrition Goals   Nutrition Goal Patient to identify strategies for reducing cardiovascular risk by attending the Pritikin education and nutrition series weekly.    Personal Goal #2 Patient to improve diet quality by using the plate method as a guide for meal planning to include lean protein/plant protein, fruits, vegetables, whole grains, nonfat dairy as part of a well-balanced diet.    Personal Goal #3 Patient to identify strategies for weight loss with goal of 0.5-2 # per week of weight loss.    Comments Patient with medical history significant for coronary artery disease with prior stent and multivessel disease, currently stable angina. Pt reports recent  wt gain due to limited activity as advised by cardiologist; current BMI in Class I Obesity category. Pt reports dietary modifications over past couple of months such  as increasing intake of salmon, lean poultry and reducing intake of sugary beverages. Reports difficulty increasing intake of vegetables due to lack of preference for taste; prefers cooked vs raw veggies. RD provided suggestions for preparation of vegetables such as steaming, sauteing and roasting with spices/herbs. Patient will benefit from participation in intensive cardiac rehab for nutrition education, exercise, and lifestyle modification.      Intervention Plan   Intervention Prescribe, educate and counsel regarding individualized specific dietary modifications aiming towards targeted core components such as weight, hypertension, lipid management, diabetes, heart failure and other comorbidities.    Expected Outcomes Short Term Goal: Understand basic principles of dietary content, such as calories, fat, sodium, cholesterol and nutrients.;Long Term Goal: Adherence to prescribed nutrition plan.          Nutrition Assessments:  MEDIFICTS Score Key: >=70 Need to make dietary changes  40-70 Heart Healthy Diet <= 40 Therapeutic Level Cholesterol Diet   Flowsheet Row INTENSIVE CARDIAC REHAB from 08/30/2024 in [facility]  Picture Your Plate Total Score on Admission 59   Picture Your Plate Scores: <59 Unhealthy dietary pattern with much room for improvement. 41-50 Dietary pattern unlikely to meet recommendations for good health and room for improvement. 51-60 More healthful dietary pattern, with some room for improvement.  >60 Healthy dietary pattern, although there may be some specific behaviors that could be improved.    Nutrition Goals Re-Evaluation:   Nutrition Goals Re-Evaluation:   Nutrition Goals Discharge (Final Nutrition Goals Re-Evaluation):   Psychosocial: Target Goals: Acknowledge presence or absence of significant depression and/or stress, maximize coping skills, provide positive support system. Participant is able  to verbalize types and ability to use techniques and skills needed for reducing stress and depression.  Initial Review & Psychosocial Screening:  Initial Psych Review & Screening - 08/24/24 1007       Initial Review   Current issues with None Identified      Family Dynamics   Good Support System? Yes   Johnmark has his wife and two daughters for support. Walden has one daughter who lives with him. Keary's other daughter lives in [place] . Rydell has 2 grandchildren.     Barriers   Psychosocial barriers to participate in program There are no identifiable barriers or psychosocial needs.      Screening Interventions   Interventions Encouraged to exercise          Quality of Life Scores:  Quality of Life - 08/24/24 0835       Quality of Life   Select Quality of Life      Quality of Life Scores   Health/Function Pre 29.1 %    Socioeconomic Pre 29.06 %    Psych/Spiritual Pre 29.64 %    Family Pre 30 %    GLOBAL Pre 29.33 %         Scores of 19 and below usually indicate a poorer quality of life in these areas.  A difference of  2-3 points is a clinically meaningful difference.  A difference of 2-3 points in the total score of the Quality of Life Index has been associated with significant improvement in overall quality of life, self-image, physical symptoms, and general health in studies assessing change in quality of life.  PHQ-9: Review Flowsheet  More data  exists      08/24/2024 06/01/2024 05/31/2024 11/28/2023 05/30/2023  Depression screen PHQ 2/9  Decreased Interest 0 0 1 0 0  Down, Depressed, Hopeless 0 0 1 0 0  PHQ - 2 Score 0 0 2 0 0  Altered sleeping 0 0 0 - 0  Tired, decreased energy 0 0 0 - 3  Change in appetite 0 0 0 - 0  Feeling bad or failure about yourself  0 0 0 - 0  Trouble concentrating 0 0 0 - 0  Moving slowly or fidgety/restless 0 0 0 - 0  Suicidal thoughts 0 0 0 - 0  PHQ-9 Score 0 0  2  - 3   Difficult doing work/chores Not difficult at all Not difficult at all  Not difficult at all - -    Details       Data saved with a previous flowsheet row definition        Interpretation of Total Score  Total Score Depression Severity:  1-4 = Minimal depression, 5-9 = Mild depression, 10-14 = Moderate depression, 15-19 = Moderately severe depression, 20-27 = Severe depression   Psychosocial Evaluation and Intervention:   Psychosocial Re-Evaluation:  Psychosocial Re-Evaluation     Row Name 09/02/24 0750 09/21/24 0847           Psychosocial Re-Evaluation   Current issues with None Identified None Identified      Comments -- Exercise has been on hold Robley has been cleared to return to group exercise.      Interventions Encouraged to attend Cardiac Rehabilitation for the exercise Encouraged to attend Cardiac Rehabilitation for the exercise      Continue Psychosocial Services  No Follow up required No Follow up required         Psychosocial Discharge (Final Psychosocial Re-Evaluation):  Psychosocial Re-Evaluation - 09/21/24 0847       Psychosocial Re-Evaluation   Current issues with None Identified    Comments Exercise has been on hold Alika has been cleared to return to group exercise.    Interventions Encouraged to attend Cardiac Rehabilitation for the exercise    Continue Psychosocial Services  No Follow up required          Vocational Rehabilitation: Provide vocational rehab assistance to qualifying candidates.   Vocational Rehab Evaluation & Intervention:  Vocational Rehab - 08/24/24 1011       Initial Vocational Rehab Evaluation & Intervention   Assessment shows need for Vocational Rehabilitation No   Justice is retired and does not need vocational rehab at this time.         Education: Education Goals: Education classes will be provided on a weekly basis, covering required topics. Participant will state understanding/return demonstration of topics presented.    Education     Row Name 08/30/24 0900     Education    Cardiac Education Topics Pritikin   Geographical Information Systems Officer Psychosocial   Psychosocial Workshop From Head to Heart: The Power of a Healthy Outlook   Instruction Review Code 1- Verbalizes Understanding   Class Start Time [phone]   Class Stop Time 0850   Class Time Calculation (min) 38 min    Row Name 09/01/24 0800     Education   Cardiac Education Topics Pritikin   Orthoptist   Educator Dietitian   Weekly Topic Tasty Appetizers and Snacks   Instruction Review Code  1- Verbalizes Understanding   Class Start Time 0815   Class Stop Time 0856   Class Time Calculation (min) 41 min    Row Name 09/03/24 0800     Education   Cardiac Education Topics Pritikin   Select Core Videos     Core Videos   Educator Dietitian   Select Nutrition   Nutrition Other  Label Reading   Instruction Review Code 1- Verbalizes Understanding   Class Start Time 0815   Class Stop Time 0850   Class Time Calculation (min) 35 min      Core Videos: Exercise    Move It!  Clinical staff conducted group or individual video education with verbal and written material and guidebook.  Patient learns the recommended Pritikin exercise program. Exercise with the goal of living a long, healthy life. Some of the health benefits of exercise include controlled diabetes, healthier blood pressure levels, improved cholesterol levels, improved heart and lung capacity, improved sleep, and better body composition. Everyone should speak with their doctor before starting or changing an exercise routine.  Biomechanical Limitations Clinical staff conducted group or individual video education with verbal and written material and guidebook.  Patient learns how biomechanical limitations can impact exercise and how we can mitigate and possibly overcome limitations to have an impactful and balanced exercise routine.  Body Composition Clinical staff  conducted group or individual video education with verbal and written material and guidebook.  Patient learns that body composition (ratio of muscle mass to fat mass) is a key component to assessing overall fitness, rather than body weight alone. Increased fat mass, especially visceral belly fat, can put us  at increased risk for metabolic syndrome, type 2 diabetes, heart disease, and even death. It is recommended to combine diet and exercise (cardiovascular and resistance training) to improve your body composition. Seek guidance from your physician and exercise physiologist before implementing an exercise routine.  Exercise Action Plan Clinical staff conducted group or individual video education with verbal and written material and guidebook.  Patient learns the recommended strategies to achieve and enjoy long-term exercise adherence, including variety, self-motivation, self-efficacy, and positive decision making. Benefits of exercise include fitness, good health, weight management, more energy, better sleep, less stress, and overall well-being.  Medical   Heart Disease Risk Reduction Clinical staff conducted group or individual video education with verbal and written material and guidebook.  Patient learns our heart is our most vital organ as it circulates oxygen, nutrients, white blood cells, and hormones throughout the entire body, and carries waste away. Data supports a plant-based eating plan like the Pritikin Program for its effectiveness in slowing progression of and reversing heart disease. The video provides a number of recommendations to address heart disease.   Metabolic Syndrome and Belly Fat  Clinical staff conducted group or individual video education with verbal and written material and guidebook.  Patient learns what metabolic syndrome is, how it leads to heart disease, and how one can reverse it and keep it from coming back. You have metabolic syndrome if you have 3 of the following 5  criteria: abdominal obesity, high blood pressure, high triglycerides, low HDL cholesterol, and high blood sugar.  Hypertension and Heart Disease Clinical staff conducted group or individual video education with verbal and written material and guidebook.  Patient learns that high blood pressure, or hypertension, is very common in the [place] . Hypertension is largely due to excessive salt intake, but other important risk factors include being overweight, physical inactivity, drinking too much  alcohol, smoking, and not eating enough potassium from fruits and vegetables. High blood pressure is a leading risk factor for heart attack, stroke, congestive heart failure, dementia, kidney failure, and premature death. Long-term effects of excessive salt intake include stiffening of the arteries and thickening of heart muscle and organ damage. Recommendations include ways to reduce hypertension and the risk of heart disease.  Diseases of Our Time - Focusing on Diabetes Clinical staff conducted group or individual video education with verbal and written material and guidebook.  Patient learns why the best way to stop diseases of our time is prevention, through food and other lifestyle changes. Medicine (such as prescription pills and surgeries) is often only a Band-Aid on the problem, not a long-term solution. Most common diseases of our time include obesity, type 2 diabetes, hypertension, heart disease, and cancer. The Pritikin Program is recommended and has been proven to help reduce, reverse, and/or prevent the damaging effects of metabolic syndrome.  Nutrition   Overview of the Pritikin Eating Plan  Clinical staff conducted group or individual video education with verbal and written material and guidebook.  Patient learns about the Pritikin Eating Plan for disease risk reduction. The Pritikin Eating Plan emphasizes a wide variety of unrefined, minimally-processed carbohydrates, like fruits, vegetables,  whole grains, and legumes. Go, Caution, and Stop food choices are explained. Plant-based and lean animal proteins are emphasized. Rationale provided for low sodium intake for blood pressure control, low added sugars for blood sugar stabilization, and low added fats and oils for coronary artery disease risk reduction and weight management.  Calorie Density  Clinical staff conducted group or individual video education with verbal and written material and guidebook.  Patient learns about calorie density and how it impacts the Pritikin Eating Plan. Knowing the characteristics of the food you choose will help you decide whether those foods will lead to weight gain or weight loss, and whether you want to consume more or less of them. Weight loss is usually a side effect of the Pritikin Eating Plan because of its focus on low calorie-dense foods.  Label Reading  Clinical staff conducted group or individual video education with verbal and written material and guidebook.  Patient learns about the Pritikin recommended label reading guidelines and corresponding recommendations regarding calorie density, added sugars, sodium content, and whole grains.  Dining Out - Part 1  Clinical staff conducted group or individual video education with verbal and written material and guidebook.  Patient learns that restaurant meals can be sabotaging because they can be so high in calories, fat, sodium, and/or sugar. Patient learns recommended strategies on how to positively address this and avoid unhealthy pitfalls.  Facts on Fats  Clinical staff conducted group or individual video education with verbal and written material and guidebook.  Patient learns that lifestyle modifications can be just as effective, if not more so, as many medications for lowering your risk of heart disease. A Pritikin lifestyle can help to reduce your risk of inflammation and atherosclerosis (cholesterol build-up, or plaque, in the artery walls).  Lifestyle interventions such as dietary choices and physical activity address the cause of atherosclerosis. A review of the types of fats and their impact on blood cholesterol levels, along with dietary recommendations to reduce fat intake is also included.  Nutrition Action Plan  Clinical staff conducted group or individual video education with verbal and written material and guidebook.  Patient learns how to incorporate Pritikin recommendations into their lifestyle. Recommendations include planning and keeping personal health goals  in mind as an important part of their success.  Healthy Mind-Set    Healthy Minds, Bodies, Hearts  Clinical staff conducted group or individual video education with verbal and written material and guidebook.  Patient learns how to identify when they are stressed. Video will discuss the impact of that stress, as well as the many benefits of stress management. Patient will also be introduced to stress management techniques. The way we think, act, and feel has an impact on our hearts.  How Our Thoughts Can Heal Our Hearts  Clinical staff conducted group or individual video education with verbal and written material and guidebook.  Patient learns that negative thoughts can cause depression and anxiety. This can result in negative lifestyle behavior and serious health problems. Cognitive behavioral therapy is an effective method to help control our thoughts in order to change and improve our emotional outlook.  Additional Videos:  Exercise    Improving Performance  Clinical staff conducted group or individual video education with verbal and written material and guidebook.  Patient learns to use a non-linear approach by alternating intensity levels and lengths of time spent exercising to help burn more calories and lose more body fat. Cardiovascular exercise helps improve heart health, metabolism, hormonal balance, blood sugar control, and recovery from fatigue. Resistance  training improves strength, endurance, balance, coordination, reaction time, metabolism, and muscle mass. Flexibility exercise improves circulation, posture, and balance. Seek guidance from your physician and exercise physiologist before implementing an exercise routine and learn your capabilities and proper form for all exercise.  Introduction to Yoga  Clinical staff conducted group or individual video education with verbal and written material and guidebook.  Patient learns about yoga, a discipline of the coming together of mind, breath, and body. The benefits of yoga include improved flexibility, improved range of motion, better posture and core strength, increased lung function, weight loss, and positive self-image. Yogas heart health benefits include lowered blood pressure, healthier heart rate, decreased cholesterol and triglyceride levels, improved immune function, and reduced stress. Seek guidance from your physician and exercise physiologist before implementing an exercise routine and learn your capabilities and proper form for all exercise.  Medical   Aging: Enhancing Your Quality of Life  Clinical staff conducted group or individual video education with verbal and written material and guidebook.  Patient learns key strategies and recommendations to stay in good physical health and enhance quality of life, such as prevention strategies, having an advocate, securing a Health Care Proxy and Power of Attorney, and keeping a list of medications and system for tracking them. It also discusses how to avoid risk for bone loss.  Biology of Weight Control  Clinical staff conducted group or individual video education with verbal and written material and guidebook.  Patient learns that weight gain occurs because we consume more calories than we burn (eating more, moving less). Even if your body weight is normal, you may have higher ratios of fat compared to muscle mass. Too much body fat puts you at  increased risk for cardiovascular disease, heart attack, stroke, type 2 diabetes, and obesity-related cancers. In addition to exercise, following the Pritikin Eating Plan can help reduce your risk.  Decoding Lab Results  Clinical staff conducted group or individual video education with verbal and written material and guidebook.  Patient learns that lab test reflects one measurement whose values change over time and are influenced by many factors, including medication, stress, sleep, exercise, food, hydration, pre-existing medical conditions, and more. It is recommended  to use the knowledge from this video to become more involved with your lab results and evaluate your numbers to speak with your doctor.   Diseases of Our Time - Overview  Clinical staff conducted group or individual video education with verbal and written material and guidebook.  Patient learns that according to the CDC, 50% to 70% of chronic diseases (such as obesity, type 2 diabetes, elevated lipids, hypertension, and heart disease) are avoidable through lifestyle improvements including healthier food choices, listening to satiety cues, and increased physical activity.  Sleep Disorders Clinical staff conducted group or individual video education with verbal and written material and guidebook.  Patient learns how good quality and duration of sleep are important to overall health and well-being. Patient also learns about sleep disorders and how they impact health along with recommendations to address them, including discussing with a physician.  Nutrition  Dining Out - Part 2 Clinical staff conducted group or individual video education with verbal and written material and guidebook.  Patient learns how to plan ahead and communicate in order to maximize their dining experience in a healthy and nutritious manner. Included are recommended food choices based on the type of restaurant the patient is visiting.   Fueling a Fish Farm Manager conducted group or individual video education with verbal and written material and guidebook.  There is a strong connection between our food choices and our health. Diseases like obesity and type 2 diabetes are very prevalent and are in large-part due to lifestyle choices. The Pritikin Eating Plan provides plenty of food and hunger-curbing satisfaction. It is easy to follow, affordable, and helps reduce health risks.  Menu Workshop  Clinical staff conducted group or individual video education with verbal and written material and guidebook.  Patient learns that restaurant meals can sabotage health goals because they are often packed with calories, fat, sodium, and sugar. Recommendations include strategies to plan ahead and to communicate with the manager, chef, or server to help order a healthier meal.  Planning Your Eating Strategy  Clinical staff conducted group or individual video education with verbal and written material and guidebook.  Patient learns about the Pritikin Eating Plan and its benefit of reducing the risk of disease. The Pritikin Eating Plan does not focus on calories. Instead, it emphasizes high-quality, nutrient-rich foods. By knowing the characteristics of the foods, we choose, we can determine their calorie density and make informed decisions.  Targeting Your Nutrition Priorities  Clinical staff conducted group or individual video education with verbal and written material and guidebook.  Patient learns that lifestyle habits have a tremendous impact on disease risk and progression. This video provides eating and physical activity recommendations based on your personal health goals, such as reducing LDL cholesterol, losing weight, preventing or controlling type 2 diabetes, and reducing high blood pressure.  Vitamins and Minerals  Clinical staff conducted group or individual video education with verbal and written material and guidebook.  Patient learns different  ways to obtain key vitamins and minerals, including through a recommended healthy diet. It is important to discuss all supplements you take with your doctor.   Healthy Mind-Set    Smoking Cessation  Clinical staff conducted group or individual video education with verbal and written material and guidebook.  Patient learns that cigarette smoking and tobacco addiction pose a serious health risk which affects millions of people. Stopping smoking will significantly reduce the risk of heart disease, lung disease, and many forms of cancer. Recommended strategies for quitting  are covered, including working with your doctor to develop a successful plan.  Culinary   Becoming a Set Designer conducted group or individual video education with verbal and written material and guidebook.  Patient learns that cooking at home can be healthy, cost-effective, quick, and puts them in control. Keys to cooking healthy recipes will include looking at your recipe, assessing your equipment needs, planning ahead, making it simple, choosing cost-effective seasonal ingredients, and limiting the use of added fats, salts, and sugars.  Cooking - Breakfast and Snacks  Clinical staff conducted group or individual video education with verbal and written material and guidebook.  Patient learns how important breakfast is to satiety and nutrition through the entire day. Recommendations include key foods to eat during breakfast to help stabilize blood sugar levels and to prevent overeating at meals later in the day. Planning ahead is also a key component.  Cooking - Educational Psychologist conducted group or individual video education with verbal and written material and guidebook.  Patient learns eating strategies to improve overall health, including an approach to cook more at home. Recommendations include thinking of animal protein as a side on your plate rather than center stage and focusing instead on  lower calorie dense options like vegetables, fruits, whole grains, and plant-based proteins, such as beans. Making sauces in large quantities to freeze for later and leaving the skin on your vegetables are also recommended to maximize your experience.  Cooking - Healthy Salads and Dressing Clinical staff conducted group or individual video education with verbal and written material and guidebook.  Patient learns that vegetables, fruits, whole grains, and legumes are the foundations of the Pritikin Eating Plan. Recommendations include how to incorporate each of these in flavorful and healthy salads, and how to create homemade salad dressings. Proper handling of ingredients is also covered. Cooking - Soups and [employer] - Soups and Desserts Clinical staff conducted group or individual video education with verbal and written material and guidebook.  Patient learns that Pritikin soups and desserts make for easy, nutritious, and delicious snacks and meal components that are low in sodium, fat, sugar, and calorie density, while high in vitamins, minerals, and filling fiber. Recommendations include simple and healthy ideas for soups and desserts.   Overview     The Pritikin Solution Program Overview Clinical staff conducted group or individual video education with verbal and written material and guidebook.  Patient learns that the results of the Pritikin Program have been documented in more than 100 articles published in peer-reviewed journals, and the benefits include reducing risk factors for (and, in some cases, even reversing) high cholesterol, high blood pressure, type 2 diabetes, obesity, and more! An overview of the three key pillars of the Pritikin Program will be covered: eating well, doing regular exercise, and having a healthy mind-set.  WORKSHOPS  Exercise: Exercise Basics: Building Your Action Plan Clinical staff led group instruction and group discussion with PowerPoint presentation  and patient guidebook. To enhance the learning environment the use of posters, models and videos may be added. At the conclusion of this workshop, patients will comprehend the difference between physical activity and exercise, as well as the benefits of incorporating both, into their routine. Patients will understand the FITT (Frequency, Intensity, Time, and Type) principle and how to use it to build an exercise action plan. In addition, safety concerns and other considerations for exercise and cardiac rehab will be addressed by the presenter. The purpose of  this lesson is to promote a comprehensive and effective weekly exercise routine in order to improve patients overall level of fitness.   Managing Heart Disease: Your Path to a Healthier Heart Clinical staff led group instruction and group discussion with PowerPoint presentation and patient guidebook. To enhance the learning environment the use of posters, models and videos may be added.At the conclusion of this workshop, patients will understand the anatomy and physiology of the heart. Additionally, they will understand how Pritikins three pillars impact the risk factors, the progression, and the management of heart disease.  The purpose of this lesson is to provide a high-level overview of the heart, heart disease, and how the Pritikin lifestyle positively impacts risk factors.  Exercise Biomechanics Clinical staff led group instruction and group discussion with PowerPoint presentation and patient guidebook. To enhance the learning environment the use of posters, models and videos may be added. Patients will learn how the structural parts of their bodies function and how these functions impact their daily activities, movement, and exercise. Patients will learn how to promote a neutral spine, learn how to manage pain, and identify ways to improve their physical movement in order to promote healthy living. The purpose of this lesson is to  expose patients to common physical limitations that impact physical activity. Participants will learn practical ways to adapt and manage aches and pains, and to minimize their effect on regular exercise. Patients will learn how to maintain good posture while sitting, walking, and lifting.  Balance Training and Fall Prevention  Clinical staff led group instruction and group discussion with PowerPoint presentation and patient guidebook. To enhance the learning environment the use of posters, models and videos may be added. At the conclusion of this workshop, patients will understand the importance of their sensorimotor skills (vision, proprioception, and the vestibular system) in maintaining their ability to balance as they age. Patients will apply a variety of balancing exercises that are appropriate for their current level of function. Patients will understand the common causes for poor balance, possible solutions to these problems, and ways to modify their physical environment in order to minimize their fall risk. The purpose of this lesson is to teach patients about the importance of maintaining balance as they age and ways to minimize their risk of falling.  WORKSHOPS   Nutrition:  Fueling a Ship Broker led group instruction and group discussion with PowerPoint presentation and patient guidebook. To enhance the learning environment the use of posters, models and videos may be added. Patients will review the foundational principles of the Pritikin Eating Plan and understand what constitutes a serving size in each of the food groups. Patients will also learn Pritikin-friendly foods that are better choices when away from home and review make-ahead meal and snack options. Calorie density will be reviewed and applied to three nutrition priorities: weight maintenance, weight loss, and weight gain. The purpose of this lesson is to reinforce (in a group setting) the key concepts around  what patients are recommended to eat and how to apply these guidelines when away from home by planning and selecting Pritikin-friendly options. Patients will understand how calorie density may be adjusted for different weight management goals.  Mindful Eating  Clinical staff led group instruction and group discussion with PowerPoint presentation and patient guidebook. To enhance the learning environment the use of posters, models and videos may be added. Patients will briefly review the concepts of the Pritikin Eating Plan and the importance of low-calorie dense foods. The concept  of mindful eating will be introduced as well as the importance of paying attention to internal hunger signals. Triggers for non-hunger eating and techniques for dealing with triggers will be explored. The purpose of this lesson is to provide patients with the opportunity to review the basic principles of the Pritikin Eating Plan, discuss the value of eating mindfully and how to measure internal cues of hunger and fullness using the Hunger Scale. Patients will also discuss reasons for non-hunger eating and learn strategies to use for controlling emotional eating.  Targeting Your Nutrition Priorities Clinical staff led group instruction and group discussion with PowerPoint presentation and patient guidebook. To enhance the learning environment the use of posters, models and videos may be added. Patients will learn how to determine their genetic susceptibility to disease by reviewing their family history. Patients will gain insight into the importance of diet as part of an overall healthy lifestyle in mitigating the impact of genetics and other environmental insults. The purpose of this lesson is to provide patients with the opportunity to assess their personal nutrition priorities by looking at their family history, their own health history and current risk factors. Patients will also be able to discuss ways of prioritizing and  modifying the Pritikin Eating Plan for their highest risk areas  Menu  Clinical staff led group instruction and group discussion with PowerPoint presentation and patient guidebook. To enhance the learning environment the use of posters, models and videos may be added. Using menus brought in from [employer], or printed from [employer], patients will apply the Pritikin dining out guidelines that were presented in the [employer] video. Patients will also be able to practice these guidelines in a variety of provided scenarios. The purpose of this lesson is to provide patients with the opportunity to practice hands-on learning of the Pritikin Dining Out guidelines with actual menus and practice scenarios.  Label Reading Clinical staff led group instruction and group discussion with PowerPoint presentation and patient guidebook. To enhance the learning environment the use of posters, models and videos may be added. Patients will review and discuss the Pritikin label reading guidelines presented in Pritikins Label Reading Educational series video. Using fool labels brought in from local grocery stores and markets, patients will apply the label reading guidelines and determine if the packaged food meet the Pritikin guidelines. The purpose of this lesson is to provide patients with the opportunity to review, discuss, and practice hands-on learning of the Pritikin Label Reading guidelines with actual packaged food labels. Cooking School  Pritikins [employer] are designed to teach patients ways to prepare quick, simple, and affordable recipes at home. The importance of nutritions role in chronic disease risk reduction is reflected in its emphasis in the overall Pritikin program. By learning how to prepare essential core Pritikin Eating Plan recipes, patients will increase control over what they eat; be able to customize the flavor of foods without the use of added salt,  sugar, or fat; and improve the quality of the food they consume. By learning a set of core recipes which are easily assembled, quickly prepared, and affordable, patients are more likely to prepare more healthy foods at home. These workshops focus on convenient breakfasts, simple entres, side dishes, and desserts which can be prepared with minimal effort and are consistent with nutrition recommendations for cardiovascular risk reduction. Cooking [employer] are taught by a armed forces logistics/support/administrative officer (RD) who has been trained by the [employer]. The chef  or RD has a clear understanding of the importance of minimizing - if not completely eliminating - added fat, sugar, and sodium in recipes. Throughout the series of Cooking School Workshop sessions, patients will learn about healthy ingredients and efficient methods of cooking to build confidence in their capability to prepare    Cooking School weekly topics:  Adding Flavor- Sodium-Free  Fast and Healthy Breakfasts  Powerhouse Plant-Based Proteins  Satisfying Salads and Dressings  Simple Sides and Sauces  International Cuisine-Spotlight on the [employer] Zones  Delicious Desserts  Savory Soups  [employer] - Meals in a Astronomer Appetizers and Snacks  Comforting Weekend Breakfasts  One-Pot Wonders   Fast Evening Meals  Landscape Architect Your Pritikin Plate  WORKSHOPS   Healthy Mindset (Psychosocial):  Focused Goals, Sustainable Changes Clinical staff led group instruction and group discussion with PowerPoint presentation and patient guidebook. To enhance the learning environment the use of posters, models and videos may be added. Patients will be able to apply effective goal setting strategies to establish at least one personal goal, and then take consistent, meaningful action toward that goal. They will learn to identify common barriers to achieving personal goals and develop strategies to overcome  them. Patients will also gain an understanding of how our mind-set can impact our ability to achieve goals and the importance of cultivating a positive and growth-oriented mind-set. The purpose of this lesson is to provide patients with a deeper understanding of how to set and achieve personal goals, as well as the tools and strategies needed to overcome common obstacles which may arise along the way.  From Head to Heart: The Power of a Healthy Outlook  Clinical staff led group instruction and group discussion with PowerPoint presentation and patient guidebook. To enhance the learning environment the use of posters, models and videos may be added. Patients will be able to recognize and describe the impact of emotions and mood on physical health. They will discover the importance of self-care and explore self-care practices which may work for them. Patients will also learn how to utilize the 4 Cs to cultivate a healthier outlook and better manage stress and challenges. The purpose of this lesson is to demonstrate to patients how a healthy outlook is an essential part of maintaining good health, especially as they continue their cardiac rehab journey.  Healthy Sleep for a Healthy Heart Clinical staff led group instruction and group discussion with PowerPoint presentation and patient guidebook. To enhance the learning environment the use of posters, models and videos may be added. At the conclusion of this workshop, patients will be able to demonstrate knowledge of the importance of sleep to overall health, well-being, and quality of life. They will understand the symptoms of, and treatments for, common sleep disorders. Patients will also be able to identify daytime and nighttime behaviors which impact sleep, and they will be able to apply these tools to help manage sleep-related challenges. The purpose of this lesson is to provide patients with a general overview of sleep and outline the importance of quality  sleep. Patients will learn about a few of the most common sleep disorders. Patients will also be introduced to the concept of sleep hygiene, and discover ways to self-manage certain sleeping problems through simple daily behavior changes. Finally, the workshop will motivate patients by clarifying the links between quality sleep and their goals of heart-healthy living.   Recognizing and Reducing Stress Clinical staff led group instruction and group discussion with PowerPoint presentation  and patient guidebook. To enhance the learning environment the use of posters, models and videos may be added. At the conclusion of this workshop, patients will be able to understand the types of stress reactions, differentiate between acute and chronic stress, and recognize the impact that chronic stress has on their health. They will also be able to apply different coping mechanisms, such as reframing negative self-talk. Patients will have the opportunity to practice a variety of stress management techniques, such as deep abdominal breathing, progressive muscle relaxation, and/or guided imagery.  The purpose of this lesson is to educate patients on the role of stress in their lives and to provide healthy techniques for coping with it.  Learning Barriers/Preferences:  Learning Barriers/Preferences - 08/24/24 1015       Learning Barriers/Preferences   Learning Barriers Sight   Double vision at times.   Learning Preferences Audio;Verbal Instruction;Video;Group Instruction;Individual Instruction;Pictoral;Skilled Demonstration          Education Topics:  Knowledge Questionnaire Score:  Knowledge Questionnaire Score - 08/24/24 0836       Knowledge Questionnaire Score   Pre Score 21/24          Core Components/Risk Factors/Patient Goals at Admission:  Personal Goals and Risk Factors at Admission - 08/24/24 0948       Core Components/Risk Factors/Patient Goals on Admission    Weight Management  Yes;Obesity;Weight Loss    Intervention Weight Management/Obesity: Establish reasonable short term and long term weight goals.;Obesity: Provide education and appropriate resources to help participant work on and attain dietary goals.    Admit Weight 188 lb 15 oz (85.7 kg)    Expected Outcomes Weight Loss: Understanding of general recommendations for a balanced deficit meal plan, which promotes 1-2 lb weight loss per week and includes a negative energy balance of [phone] kcal/d    Hypertension Yes    Intervention Provide education on lifestyle modifcations including regular physical activity/exercise, weight management, moderate sodium restriction and increased consumption of fresh fruit, vegetables, and low fat dairy, alcohol moderation, and smoking cessation.;Monitor prescription use compliance.    Expected Outcomes Short Term: Continued assessment and intervention until BP is < 140/68mm HG in hypertensive participants. < 130/40mm HG in hypertensive participants with diabetes, heart failure or chronic kidney disease.;Long Term: Maintenance of blood pressure at goal levels.    Lipids Yes    Intervention Provide education and support for participant on nutrition & aerobic/resistive exercise along with prescribed medications to achieve LDL 70mg , HDL >40mg .    Expected Outcomes Short Term: Participant states understanding of desired cholesterol values and is compliant with medications prescribed. Participant is following exercise prescription and nutrition guidelines.;Long Term: Cholesterol controlled with medications as prescribed, with individualized exercise RX and with personalized nutrition plan. Value goals: LDL < 70mg , HDL > 40 mg.    Stress Yes    Intervention Offer individual and/or small group education and counseling on adjustment to heart disease, stress management and health-related lifestyle change. Teach and support self-help strategies.;Refer participants experiencing significant  psychosocial distress to appropriate mental health specialists for further evaluation and treatment. When possible, include family members and significant others in education/counseling sessions.    Expected Outcomes Short Term: Participant demonstrates changes in health-related behavior, relaxation and other stress management skills, ability to obtain effective social support, and compliance with psychotropic medications if prescribed.;Long Term: Emotional wellbeing is indicated by absence of clinically significant psychosocial distress or social isolation.          Core Components/Risk Factors/Patient Goals Review:  Goals and Risk Factor Review     Row Name 09/02/24 9247 09/21/24 0849           Core Components/Risk Factors/Patient Goals Review   Personal Goals Review Weight Management/Obesity;Hypertension;Stress;Lipids Weight Management/Obesity;Hypertension;Stress;Lipids      Review Athony started cardiac rehab on 08/30/24. Amadi is off to a good start with exercise.Vital signs have been stable. Niccolas's last day of attrendance was on 09/13/24. Treyton had angina has been seen by Dr Anner in the office and cleared to return to group exercise.      Expected Outcomes Gifford will continue to exercise at cardiac rehab for exercise, nutrition and lifestyle modifications D'Arcy will continue to exercise at cardiac rehab for exercise, nutrition and lifestyle modifications         Core Components/Risk Factors/Patient Goals at Discharge (Final Review):   Goals and Risk Factor Review - 09/21/24 0849       Core Components/Risk Factors/Patient Goals Review   Personal Goals Review Weight Management/Obesity;Hypertension;Stress;Lipids    Review Armoni's last day of attrendance was on 09/13/24. Trajon had angina has been seen by Dr Anner in the office and cleared to return to group exercise.    Expected Outcomes Gale will continue to exercise at cardiac rehab for exercise, nutrition and lifestyle modifications           ITP Comments:  ITP Comments     Row Name 08/24/24 0748 09/02/24 0749 09/21/24 0845       ITP Comments Medical Director- Dr. Wilbert Bihari, MD. Introduction to the [facility] Intensive [facility]iac [facility]hab Program. 30 Day ITP Review. Shahan started cardiac rehab on 08/30/24. Mihran is off to a good start to exercise. 30 Day ITP Review. Haris started cardiac rehab on 08/30/24. Issiah has been cleared to return to exercise by Dr Anner.        Comments: See ITP comments.Hadassah Elpidio Quan RN BSN     [1]  Current Outpatient Medications:    acetaminophen  (TYLENOL ) 325 MG tablet, Take 650 mg by mouth every 6 (six) hours as needed for moderate pain (pain score 4-6)., Disp: , Rfl:    amLODipine  (NORVASC ) 10 MG tablet, Take 1 tablet (10 mg total) by mouth every morning., Disp: , Rfl:    aspirin  EC 81 MG tablet, Take 1 tablet (81 mg total) by mouth daily. Swallow whole., Disp: , Rfl:    atorvastatin  (LIPITOR) 80 MG tablet, Take 1 tablet (80 mg total) by mouth daily., Disp: 90 tablet, Rfl: 3   EPINEPHrine  0.3 mg/0.3 mL IJ SOAJ injection, Inject 0.3 mg into the muscle as needed for anaphylaxis., Disp: 2 each, Rfl: 1   isosorbide  mononitrate (IMDUR ) 30 MG 24 hr tablet, Take 1 tablet (30 mg total) by mouth daily., Disp: 90 tablet, Rfl: 3   isosorbide  mononitrate (IMDUR ) 60 MG 24 hr tablet, Take 1 tablet (60 mg total) by mouth daily., Disp: 90 tablet, Rfl: 3   levothyroxine  (SYNTHROID ) 50 MCG tablet, Take 1 tablet (50 mcg total) by mouth daily., Disp: 90 tablet, Rfl: 3   metoprolol  succinate (TOPROL  XL) 25 MG 24 hr tablet, Take 1 tablet (25 mg total) by mouth at bedtime., Disp: 90 tablet, Rfl: 3   nitroGLYCERIN  (NITROSTAT ) 0.4 MG SL tablet, Place 1 tablet (0.4 mg total) under the tongue every 5 (five) minutes as needed for chest pain., Disp: 30 tablet, Rfl: 3 [2]  Social History Tobacco Use  Smoking Status Former   Current packs/day: 0.00   Types: Cigarettes   Quit date:  08/26/1982   Years  since quitting: 42.1  Smokeless Tobacco Never

## 2024-09-22 ENCOUNTER — Other Ambulatory Visit: Payer: Self-pay | Admitting: Nurse Practitioner

## 2024-09-22 ENCOUNTER — Encounter (HOSPITAL_COMMUNITY)
Admission: RE | Admit: 2024-09-22 | Discharge: 2024-09-22 | Disposition: A | Source: Ambulatory Visit | Attending: Cardiology

## 2024-09-22 DIAGNOSIS — I2089 Other forms of angina pectoris: Secondary | ICD-10-CM | POA: Diagnosis not present

## 2024-09-23 ENCOUNTER — Telehealth (HOSPITAL_COMMUNITY): Payer: Self-pay

## 2024-09-23 NOTE — Telephone Encounter (Signed)
-----   Message from Alm Clay, MD sent at 09/23/2024 10:34 AM EST ----- Regarding: RE: THRR for Tavish Hosman Ca target 100-105 ----- Message ----- From: Janann Alm Sent: 09/23/2024   8:10 AM EST To: Alm LELON Clay, MD; Hadassah LELON Quan, RN Subject: KENDA for [place] Saldivar                        Good morning Dr.Harding,  Norleen returned to the [facility] program yesterday and exercised without complaints. He did mention that during his appointment with him you mentioned not going over a heart rate of 100 during exercise. His current THRR is 60-121. He experienced chest burning during exercise at a HR of 112 on 09/03/2024.  Obviously, we want to keep him below his anginal threshold.  What THRR do you recommend for this patient? Thank you for your consideration of this request.  Regards,   Alm Janann MS, ACSM-CEP, CCRP

## 2024-09-24 ENCOUNTER — Encounter (HOSPITAL_COMMUNITY)
Admission: RE | Admit: 2024-09-24 | Discharge: 2024-09-24 | Disposition: A | Source: Ambulatory Visit | Attending: Cardiology

## 2024-09-24 DIAGNOSIS — I2089 Other forms of angina pectoris: Secondary | ICD-10-CM | POA: Diagnosis not present

## 2024-09-27 ENCOUNTER — Encounter (HOSPITAL_COMMUNITY)

## 2024-09-29 ENCOUNTER — Encounter (HOSPITAL_COMMUNITY): Admission: RE | Admit: 2024-09-29 | Discharge: 2024-09-29 | Attending: Cardiology

## 2024-09-29 DIAGNOSIS — I2089 Other forms of angina pectoris: Secondary | ICD-10-CM

## 2024-10-01 ENCOUNTER — Encounter (HOSPITAL_COMMUNITY): Admission: RE | Admit: 2024-10-01

## 2024-10-01 DIAGNOSIS — I2089 Other forms of angina pectoris: Secondary | ICD-10-CM

## 2024-10-04 ENCOUNTER — Encounter (HOSPITAL_COMMUNITY)

## 2024-10-06 ENCOUNTER — Encounter (HOSPITAL_COMMUNITY)

## 2024-10-08 ENCOUNTER — Encounter (HOSPITAL_COMMUNITY)

## 2024-10-11 ENCOUNTER — Encounter (HOSPITAL_COMMUNITY)

## 2024-10-12 ENCOUNTER — Ambulatory Visit (HOSPITAL_BASED_OUTPATIENT_CLINIC_OR_DEPARTMENT_OTHER): Admitting: Cardiology

## 2024-10-13 ENCOUNTER — Encounter (HOSPITAL_COMMUNITY)

## 2024-10-15 ENCOUNTER — Encounter (HOSPITAL_COMMUNITY)

## 2024-10-18 ENCOUNTER — Encounter (HOSPITAL_COMMUNITY)

## 2024-10-20 ENCOUNTER — Encounter (HOSPITAL_COMMUNITY)

## 2024-10-22 ENCOUNTER — Encounter (HOSPITAL_COMMUNITY)

## 2024-10-25 ENCOUNTER — Encounter (HOSPITAL_COMMUNITY)

## 2024-10-27 ENCOUNTER — Encounter (HOSPITAL_COMMUNITY)

## 2024-10-29 ENCOUNTER — Encounter (HOSPITAL_COMMUNITY)

## 2024-11-01 ENCOUNTER — Encounter (HOSPITAL_COMMUNITY)

## 2024-11-03 ENCOUNTER — Encounter (HOSPITAL_COMMUNITY)

## 2024-11-05 ENCOUNTER — Encounter (HOSPITAL_COMMUNITY)

## 2024-11-08 ENCOUNTER — Encounter (HOSPITAL_COMMUNITY)

## 2024-11-10 ENCOUNTER — Encounter (HOSPITAL_COMMUNITY)

## 2024-11-12 ENCOUNTER — Encounter (HOSPITAL_COMMUNITY)

## 2024-11-15 ENCOUNTER — Encounter (HOSPITAL_COMMUNITY)

## 2024-11-17 ENCOUNTER — Encounter (HOSPITAL_COMMUNITY)

## 2024-11-18 ENCOUNTER — Ambulatory Visit: Admitting: Emergency Medicine

## 2024-11-30 ENCOUNTER — Ambulatory Visit: Admitting: Family Medicine

## 2024-12-03 ENCOUNTER — Ambulatory Visit: Admitting: Family Medicine

## 2025-02-08 ENCOUNTER — Ambulatory Visit: Admitting: Cardiology

## 2025-06-02 ENCOUNTER — Ambulatory Visit

## 2025-06-03 ENCOUNTER — Ambulatory Visit
